# Patient Record
Sex: Male | Born: 1935 | Race: White | Hispanic: No | Marital: Married | Smoking: Former smoker
Health system: Southern US, Community
[De-identification: ages and names within clinical notes are randomized; demographics above are authoritative.]

## PROBLEM LIST (undated history)

## (undated) DIAGNOSIS — M25559 Pain in unspecified hip: Secondary | ICD-10-CM

## (undated) DIAGNOSIS — E119 Type 2 diabetes mellitus without complications: Principal | ICD-10-CM

## (undated) DIAGNOSIS — K5900 Constipation, unspecified: Secondary | ICD-10-CM

## (undated) DIAGNOSIS — S065X9A Traumatic subdural hemorrhage with loss of consciousness of unspecified duration, initial encounter: Secondary | ICD-10-CM

## (undated) DIAGNOSIS — R972 Elevated prostate specific antigen [PSA]: Secondary | ICD-10-CM

## (undated) DIAGNOSIS — M75101 Unspecified rotator cuff tear or rupture of right shoulder, not specified as traumatic: Secondary | ICD-10-CM

## (undated) DIAGNOSIS — S065XAA Traumatic subdural hemorrhage with loss of consciousness status unknown, initial encounter: Secondary | ICD-10-CM

## (undated) DIAGNOSIS — Z201 Contact with and (suspected) exposure to tuberculosis: Secondary | ICD-10-CM

## (undated) DIAGNOSIS — M25519 Pain in unspecified shoulder: Secondary | ICD-10-CM

## (undated) DIAGNOSIS — R5381 Other malaise: Secondary | ICD-10-CM

## (undated) DIAGNOSIS — K259 Gastric ulcer, unspecified as acute or chronic, without hemorrhage or perforation: Secondary | ICD-10-CM

## (undated) DIAGNOSIS — K4090 Unilateral inguinal hernia, without obstruction or gangrene, not specified as recurrent: Secondary | ICD-10-CM

## (undated) DIAGNOSIS — S2239XA Fracture of one rib, unspecified side, initial encounter for closed fracture: Secondary | ICD-10-CM

## (undated) DIAGNOSIS — E785 Hyperlipidemia, unspecified: Secondary | ICD-10-CM

## (undated) DIAGNOSIS — N400 Enlarged prostate without lower urinary tract symptoms: Secondary | ICD-10-CM

## (undated) DIAGNOSIS — Z8613 Personal history of malaria: Secondary | ICD-10-CM

## (undated) DIAGNOSIS — L719 Rosacea, unspecified: Secondary | ICD-10-CM

## (undated) DIAGNOSIS — N200 Calculus of kidney: Secondary | ICD-10-CM

## (undated) DIAGNOSIS — K279 Peptic ulcer, site unspecified, unspecified as acute or chronic, without hemorrhage or perforation: Secondary | ICD-10-CM

## (undated) DIAGNOSIS — G4700 Insomnia, unspecified: Secondary | ICD-10-CM

## (undated) DIAGNOSIS — N529 Male erectile dysfunction, unspecified: Secondary | ICD-10-CM

## (undated) DIAGNOSIS — R5383 Other fatigue: Secondary | ICD-10-CM

## (undated) DIAGNOSIS — S4991XA Unspecified injury of right shoulder and upper arm, initial encounter: Secondary | ICD-10-CM

## (undated) DIAGNOSIS — N320 Bladder-neck obstruction: Secondary | ICD-10-CM

## (undated) DIAGNOSIS — E039 Hypothyroidism, unspecified: Secondary | ICD-10-CM

## (undated) DIAGNOSIS — I10 Essential (primary) hypertension: Secondary | ICD-10-CM

## (undated) HISTORY — DX: Insomnia, unspecified: G47.00

## (undated) HISTORY — DX: Bladder-neck obstruction: N32.0

## (undated) HISTORY — DX: Peptic ulcer, site unspecified, unspecified as acute or chronic, without hemorrhage or perforation: K27.9

## (undated) HISTORY — DX: Hypothyroidism, unspecified: E03.9

## (undated) HISTORY — DX: Rosacea, unspecified: L71.9

## (undated) HISTORY — DX: Pain in unspecified hip: M25.559

## (undated) HISTORY — DX: Male erectile dysfunction, unspecified: N52.9

## (undated) HISTORY — DX: Unilateral inguinal hernia, without obstruction or gangrene, not specified as recurrent: K40.90

## (undated) HISTORY — PX: PROSTATE SURGERY: SHX751

## (undated) HISTORY — DX: Other malaise: R53.81

## (undated) HISTORY — DX: Personal history of malaria: Z86.13

## (undated) HISTORY — DX: Other fatigue: R53.83

## (undated) HISTORY — DX: Gastric ulcer, unspecified as acute or chronic, without hemorrhage or perforation: K25.9

## (undated) HISTORY — DX: Constipation, unspecified: K59.00

## (undated) HISTORY — DX: Elevated prostate specific antigen (PSA): R97.20

## (undated) HISTORY — DX: Hyperlipidemia, unspecified: E78.5

## (undated) HISTORY — DX: Contact with and (suspected) exposure to tuberculosis: Z20.1

## (undated) HISTORY — DX: Pain in unspecified shoulder: M25.519

## (undated) HISTORY — DX: Benign prostatic hyperplasia without lower urinary tract symptoms: N40.0

## (undated) HISTORY — DX: Type 2 diabetes mellitus without complications: E11.9

---
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
## 1961-03-15 HISTORY — PX: PILONIDAL CYST EXCISION: SHX744

## 1989-03-15 DIAGNOSIS — N400 Enlarged prostate without lower urinary tract symptoms: Secondary | ICD-10-CM

## 1989-03-15 HISTORY — DX: Benign prostatic hyperplasia without lower urinary tract symptoms: N40.0

## 1992-03-15 DIAGNOSIS — Z201 Contact with and (suspected) exposure to tuberculosis: Secondary | ICD-10-CM

## 1992-03-15 HISTORY — DX: Contact with and (suspected) exposure to tuberculosis: Z20.1

## 1993-03-15 DIAGNOSIS — K259 Gastric ulcer, unspecified as acute or chronic, without hemorrhage or perforation: Secondary | ICD-10-CM

## 1993-03-15 HISTORY — DX: Gastric ulcer, unspecified as acute or chronic, without hemorrhage or perforation: K25.9

## 1998-03-15 DIAGNOSIS — R972 Elevated prostate specific antigen [PSA]: Secondary | ICD-10-CM

## 1998-03-15 DIAGNOSIS — N529 Male erectile dysfunction, unspecified: Secondary | ICD-10-CM

## 1998-03-15 HISTORY — DX: Elevated prostate specific antigen (PSA): R97.20

## 1998-03-15 HISTORY — DX: Male erectile dysfunction, unspecified: N52.9

## 1998-07-08 DIAGNOSIS — I10 Essential (primary) hypertension: Secondary | ICD-10-CM

## 1998-07-08 HISTORY — DX: Essential (primary) hypertension: I10

## 2002-06-14 HISTORY — PX: HEMORROIDECTOMY: SUR656

## 2002-06-28 ENCOUNTER — Encounter (INDEPENDENT_AMBULATORY_CARE_PROVIDER_SITE_OTHER): Payer: Self-pay | Admitting: Specialist

## 2002-06-28 ENCOUNTER — Ambulatory Visit (HOSPITAL_COMMUNITY): Admission: RE | Admit: 2002-06-28 | Discharge: 2002-06-28 | Payer: Self-pay | Admitting: General Surgery

## 2003-06-25 DIAGNOSIS — K279 Peptic ulcer, site unspecified, unspecified as acute or chronic, without hemorrhage or perforation: Secondary | ICD-10-CM

## 2003-06-25 HISTORY — DX: Peptic ulcer, site unspecified, unspecified as acute or chronic, without hemorrhage or perforation: K27.9

## 2003-11-01 DIAGNOSIS — G4700 Insomnia, unspecified: Secondary | ICD-10-CM

## 2003-11-01 DIAGNOSIS — M25519 Pain in unspecified shoulder: Secondary | ICD-10-CM

## 2003-11-01 HISTORY — DX: Pain in unspecified shoulder: M25.519

## 2003-11-01 HISTORY — DX: Insomnia, unspecified: G47.00

## 2004-12-23 DIAGNOSIS — E785 Hyperlipidemia, unspecified: Secondary | ICD-10-CM | POA: Insufficient documentation

## 2004-12-23 DIAGNOSIS — R5381 Other malaise: Secondary | ICD-10-CM

## 2004-12-23 HISTORY — DX: Hyperlipidemia, unspecified: E78.5

## 2004-12-23 HISTORY — DX: Other malaise: R53.81

## 2006-07-15 DIAGNOSIS — E119 Type 2 diabetes mellitus without complications: Secondary | ICD-10-CM

## 2006-07-15 HISTORY — DX: Type 2 diabetes mellitus without complications: E11.9

## 2008-03-03 ENCOUNTER — Ambulatory Visit (HOSPITAL_COMMUNITY): Admission: RE | Admit: 2008-03-03 | Discharge: 2008-03-03 | Payer: Self-pay | Admitting: Family Medicine

## 2008-03-06 ENCOUNTER — Ambulatory Visit (HOSPITAL_BASED_OUTPATIENT_CLINIC_OR_DEPARTMENT_OTHER): Admission: RE | Admit: 2008-03-06 | Discharge: 2008-03-06 | Payer: Self-pay | Admitting: Urology

## 2008-03-22 ENCOUNTER — Inpatient Hospital Stay (HOSPITAL_COMMUNITY): Admission: AD | Admit: 2008-03-22 | Discharge: 2008-03-25 | Payer: Self-pay | Admitting: Urology

## 2008-03-28 ENCOUNTER — Inpatient Hospital Stay (HOSPITAL_COMMUNITY): Admission: RE | Admit: 2008-03-28 | Discharge: 2008-04-04 | Payer: Self-pay | Admitting: Urology

## 2008-03-28 ENCOUNTER — Encounter (INDEPENDENT_AMBULATORY_CARE_PROVIDER_SITE_OTHER): Payer: Self-pay | Admitting: Urology

## 2008-03-28 DIAGNOSIS — N320 Bladder-neck obstruction: Secondary | ICD-10-CM

## 2008-03-28 HISTORY — DX: Bladder-neck obstruction: N32.0

## 2008-10-30 ENCOUNTER — Encounter: Admission: RE | Admit: 2008-10-30 | Discharge: 2008-10-30 | Payer: Self-pay | Admitting: Surgery

## 2008-11-13 HISTORY — PX: HERNIA REPAIR: SHX51

## 2008-11-20 DIAGNOSIS — K4090 Unilateral inguinal hernia, without obstruction or gangrene, not specified as recurrent: Secondary | ICD-10-CM

## 2008-11-20 HISTORY — DX: Unilateral inguinal hernia, without obstruction or gangrene, not specified as recurrent: K40.90

## 2009-07-10 ENCOUNTER — Inpatient Hospital Stay (HOSPITAL_COMMUNITY): Admission: RE | Admit: 2009-07-10 | Discharge: 2009-07-14 | Payer: Self-pay | Admitting: Orthopedic Surgery

## 2009-07-10 HISTORY — PX: TOTAL HIP ARTHROPLASTY: SHX124

## 2009-08-20 DIAGNOSIS — N200 Calculus of kidney: Secondary | ICD-10-CM

## 2009-08-20 DIAGNOSIS — L719 Rosacea, unspecified: Secondary | ICD-10-CM

## 2009-08-20 HISTORY — DX: Rosacea, unspecified: L71.9

## 2009-08-20 HISTORY — DX: Calculus of kidney: N20.0

## 2009-12-17 DIAGNOSIS — E039 Hypothyroidism, unspecified: Secondary | ICD-10-CM

## 2009-12-17 DIAGNOSIS — M25559 Pain in unspecified hip: Secondary | ICD-10-CM

## 2009-12-17 HISTORY — DX: Pain in unspecified hip: M25.559

## 2009-12-17 HISTORY — DX: Hypothyroidism, unspecified: E03.9

## 2010-02-18 ENCOUNTER — Ambulatory Visit (HOSPITAL_COMMUNITY)
Admission: RE | Admit: 2010-02-18 | Discharge: 2010-02-18 | Payer: Self-pay | Source: Home / Self Care | Admitting: Orthopedic Surgery

## 2010-06-02 LAB — BASIC METABOLIC PANEL
BUN: 24 mg/dL — ABNORMAL HIGH (ref 6–23)
BUN: 18 mg/dL (ref 6–23)
BUN: 25 mg/dL — ABNORMAL HIGH (ref 6–23)
CO2: 25 mEq/L (ref 19–32)
Calcium: 7.8 mg/dL — ABNORMAL LOW (ref 8.4–10.5)
Calcium: 7.8 mg/dL — ABNORMAL LOW (ref 8.4–10.5)
Chloride: 105 mEq/L (ref 96–112)
Chloride: 108 mEq/L (ref 96–112)
Chloride: 106 mEq/L (ref 96–112)
Creatinine, Ser: 1.12 mg/dL (ref 0.4–1.5)
Creatinine, Ser: 1.13 mg/dL (ref 0.4–1.5)
Creatinine, Ser: 0.93 mg/dL (ref 0.4–1.5)
GFR calc Af Amer: >60 mL/min (ref >60)
GFR calc Af Amer: >60 mL/min (ref >60)
GFR calc non Af Amer: >60 mL/min (ref >60)
GFR calc non Af Amer: >60 mL/min (ref >60)
Glucose, Bld: 109 mg/dL — ABNORMAL HIGH (ref 70–99)
Glucose, Bld: 117 mg/dL — ABNORMAL HIGH (ref 70–99)
Glucose, Bld: 136 mg/dL — ABNORMAL HIGH (ref 70–99)
Potassium: 3.6 mEq/L (ref 3.5–5.1)
Potassium: 3.9 mEq/L (ref 3.5–5.1)
Sodium: 134 mEq/L — ABNORMAL LOW (ref 135–145)
Sodium: 136 mEq/L (ref 135–145)

## 2010-06-02 LAB — CBC
HCT: 26.1 % — ABNORMAL LOW (ref 39.0–52.0)
Hemoglobin: 8.3 g/dL — ABNORMAL LOW (ref 13.0–17.0)
Hemoglobin: 13.2 g/dL (ref 13.0–17.0)
Hemoglobin: 8.8 g/dL — ABNORMAL LOW (ref 13.0–17.0)
MCHC: 34.3 g/dL (ref 30.0–36.0)
MCHC: 33.6 g/dL (ref 30.0–36.0)
MCV: 91.2 fL (ref 78.0–100.0)
MCV: 91.3 fL (ref 78.0–100.0)
Platelets: 164 10*3/uL (ref 150–400)
Platelets: 130 10*3/uL — ABNORMAL LOW (ref 150–400)
Platelets: 141 10*3/uL — ABNORMAL LOW (ref 150–400)
RBC: 2.63 MIL/uL — ABNORMAL LOW (ref 4.22–5.81)
RBC: 4.27 MIL/uL (ref 4.22–5.81)
RDW: 13.7 % (ref 11.5–15.5)
RDW: 14 % (ref 11.5–15.5)
RDW: 13.9 % (ref 11.5–15.5)
RDW: 14 % (ref 11.5–15.5)
WBC: 7.1 10*3/uL (ref 4.0–10.5)

## 2010-06-02 LAB — GLUCOSE, CAPILLARY
Glucose-Capillary: 113 mg/dL — ABNORMAL HIGH (ref 70–99)
Glucose-Capillary: 115 mg/dL — ABNORMAL HIGH (ref 70–99)
Glucose-Capillary: 147 mg/dL — ABNORMAL HIGH (ref 70–99)
Glucose-Capillary: 108 mg/dL — ABNORMAL HIGH (ref 70–99)
Glucose-Capillary: 112 mg/dL — ABNORMAL HIGH (ref 70–99)
Glucose-Capillary: 114 mg/dL — ABNORMAL HIGH (ref 70–99)
Glucose-Capillary: 166 mg/dL — ABNORMAL HIGH (ref 70–99)
Glucose-Capillary: 186 mg/dL — ABNORMAL HIGH (ref 70–99)

## 2010-06-02 LAB — COMPREHENSIVE METABOLIC PANEL
AST: 16 U/L (ref 0–37)
Alkaline Phosphatase: 55 U/L (ref 39–117)
BUN: 18 mg/dL (ref 6–23)
Chloride: 105 mEq/L (ref 96–112)
Creatinine, Ser: 0.94 mg/dL (ref 0.4–1.5)
Glucose, Bld: 96 mg/dL (ref 70–99)
Sodium: 137 mEq/L (ref 135–145)
Total Protein: 6.9 g/dL (ref 6.0–8.3)

## 2010-06-02 LAB — DIFFERENTIAL
Basophils Relative: 1 % (ref 0–1)
Eosinophils Absolute: 0.3 10*3/uL (ref 0.0–0.7)
Lymphocytes Relative: 21 % (ref 12–46)
Lymphs Abs: 1.4 10*3/uL (ref 0.7–4.0)
Monocytes Relative: 8 % (ref 3–12)
Neutro Abs: 4.4 10*3/uL (ref 1.7–7.7)
Neutrophils Relative %: 66 % (ref 43–77)

## 2010-06-02 LAB — URINALYSIS, ROUTINE W REFLEX MICROSCOPIC
Nitrite: NEGATIVE
Urobilinogen, UA: 0.2 mg/dL (ref 0.0–1.0)
pH: 5 (ref 5.0–8.0)

## 2010-06-02 LAB — CROSSMATCH
ABO/RH(D): O POS
Antibody Screen: NEGATIVE

## 2010-06-02 LAB — HEMOGLOBIN A1C
Hgb A1c MFr Bld: 6 % — ABNORMAL HIGH (ref <5.7)
Mean Plasma Glucose: 126 mg/dL — ABNORMAL HIGH (ref <117)

## 2010-06-29 LAB — COMPREHENSIVE METABOLIC PANEL
AST: 20 U/L (ref 0–37)
Albumin: 3.5 g/dL (ref 3.5–5.2)
Alkaline Phosphatase: 55 U/L (ref 39–117)
CO2: 26 mEq/L (ref 19–32)
Chloride: 102 mEq/L (ref 96–112)
GFR calc Af Amer: >60 mL/min (ref >60)
GFR calc non Af Amer: 57 mL/min — ABNORMAL LOW (ref >60)
Potassium: 3.6 mEq/L (ref 3.5–5.1)
Total Bilirubin: 0.6 mg/dL (ref 0.3–1.2)

## 2010-06-29 LAB — CBC
HCT: 29.7 % — ABNORMAL LOW (ref 39.0–52.0)
HCT: 31.7 % — ABNORMAL LOW (ref 39.0–52.0)
HCT: 32.2 % — ABNORMAL LOW (ref 39.0–52.0)
HCT: 33.1 % — ABNORMAL LOW (ref 39.0–52.0)
HCT: 22.1 % — ABNORMAL LOW (ref 39.0–52.0)
Hemoglobin: 10 g/dL — ABNORMAL LOW (ref 13.0–17.0)
Hemoglobin: 10.8 g/dL — ABNORMAL LOW (ref 13.0–17.0)
Hemoglobin: 9.9 g/dL — ABNORMAL LOW (ref 13.0–17.0)
Hemoglobin: 9.1 g/dL — ABNORMAL LOW (ref 13.0–17.0)
MCHC: 33.6 g/dL (ref 30.0–36.0)
MCHC: 33.8 g/dL (ref 30.0–36.0)
MCHC: 33.9 g/dL (ref 30.0–36.0)
MCHC: 34 g/dL (ref 30.0–36.0)
MCV: 88 fL (ref 78.0–100.0)
MCV: 88.7 fL (ref 78.0–100.0)
MCV: 89.4 fL (ref 78.0–100.0)
MCV: 90 fL (ref 78.0–100.0)
MCV: 90.1 fL (ref 78.0–100.0)
Platelets: 187 10*3/uL (ref 150–400)
Platelets: 189 10*3/uL (ref 150–400)
Platelets: 192 10*3/uL (ref 150–400)
Platelets: 216 10*3/uL (ref 150–400)
Platelets: 162 10*3/uL (ref 150–400)
Platelets: 185 10*3/uL (ref 150–400)
RBC: 3.32 MIL/uL — ABNORMAL LOW (ref 4.22–5.81)
RBC: 3.63 MIL/uL — ABNORMAL LOW (ref 4.22–5.81)
RBC: 3.76 MIL/uL — ABNORMAL LOW (ref 4.22–5.81)
RBC: 2.47 MIL/uL — ABNORMAL LOW (ref 4.22–5.81)
RDW: 13.5 % (ref 11.5–15.5)
RDW: 13.7 % (ref 11.5–15.5)
RDW: 13.7 % (ref 11.5–15.5)
RDW: 13.8 % (ref 11.5–15.5)
RDW: 13.8 % (ref 11.5–15.5)
WBC: 6.3 10*3/uL (ref 4.0–10.5)
WBC: 8 10*3/uL (ref 4.0–10.5)
WBC: 6.3 10*3/uL (ref 4.0–10.5)
WBC: 8 10*3/uL (ref 4.0–10.5)
WBC: 8.3 10*3/uL (ref 4.0–10.5)

## 2010-06-29 LAB — CROSSMATCH
ABO/RH(D): O POS
Antibody Screen: NEGATIVE

## 2010-06-29 LAB — BASIC METABOLIC PANEL
BUN: 29 mg/dL — ABNORMAL HIGH (ref 6–23)
BUN: 19 mg/dL (ref 6–23)
CO2: 26 mEq/L (ref 19–32)
CO2: 26 mEq/L (ref 19–32)
Calcium: 8.1 mg/dL — ABNORMAL LOW (ref 8.4–10.5)
Calcium: 8.3 mg/dL — ABNORMAL LOW (ref 8.4–10.5)
Calcium: 8.2 mg/dL — ABNORMAL LOW (ref 8.4–10.5)
Chloride: 103 mEq/L (ref 96–112)
Creatinine, Ser: 1.72 mg/dL — ABNORMAL HIGH (ref 0.4–1.5)
Creatinine, Ser: 1.35 mg/dL (ref 0.4–1.5)
GFR calc Af Amer: >60 mL/min (ref >60)
GFR calc Af Amer: >60 mL/min (ref >60)
GFR calc Af Amer: >60 mL/min (ref >60)
GFR calc non Af Amer: 39 mL/min — ABNORMAL LOW (ref >60)
GFR calc non Af Amer: 51 mL/min — ABNORMAL LOW (ref >60)
GFR calc non Af Amer: 51 mL/min — ABNORMAL LOW (ref >60)
Glucose, Bld: 118 mg/dL — ABNORMAL HIGH (ref 70–99)
Glucose, Bld: 129 mg/dL — ABNORMAL HIGH (ref 70–99)
Glucose, Bld: 120 mg/dL — ABNORMAL HIGH (ref 70–99)
Potassium: 3.2 mEq/L — ABNORMAL LOW (ref 3.5–5.1)
Potassium: 3.9 mEq/L (ref 3.5–5.1)
Sodium: 134 mEq/L — ABNORMAL LOW (ref 135–145)
Sodium: 132 mEq/L — ABNORMAL LOW (ref 135–145)

## 2010-06-29 LAB — ABO/RH: ABO/RH(D): O POS

## 2010-06-29 LAB — GLUCOSE, CAPILLARY
Glucose-Capillary: 107 mg/dL — ABNORMAL HIGH (ref 70–99)
Glucose-Capillary: 88 mg/dL (ref 70–99)

## 2010-06-29 LAB — POCT I-STAT 4, (NA,K, GLUC, HGB,HCT)
Glucose, Bld: 121 mg/dL — ABNORMAL HIGH (ref 70–99)
Potassium: 4 mEq/L (ref 3.5–5.1)

## 2010-06-29 LAB — HEMOCCULT GUIAC POC 1CARD (OFFICE): Fecal Occult Bld: NEGATIVE

## 2010-07-28 NOTE — Op Note (Signed)
NAMELUCERO, [name]            ACCOUNT NO.:  [id number]   MEDICAL RECORD NO.:  [id number]          PATIENT TYPE:  INP   LOCATION:  1441                         FACILITY:  [facility]   PHYSICIAN:  Bobby Jensen, Bobby JensenDATE OF BIRTH:  04/08/1927   DATE OF PROCEDURE:  03/28/2008  DATE OF DISCHARGE:                               OPERATIVE REPORT   SURGEON:  Bobby Jensen, M.D.   ASSISTANT:  Dr. Allena Jensen.   PREOPERATIVE DIAGNOSES:  1. Benign prostatic hypertrophy.  2. Left distal ureteral stone.   POSTOPERATIVE DIAGNOSES:  1. Benign prostatic hypertrophy.  2. Left distal ureteral stone.   PROCEDURE:  1. Intravesical suprapubic prostatectomy.  2. Left-sided soft Flex double-J stent placement.   INDICATIONS:  Bobby Jensen is a 77 year old gentleman with a  symptomatic distal left ureteral stone.  He was seen in an attempt to  manage the stone.  However, it was radiolucent on KUB.  Furthermore,  due to his large prostate, we were unable to gain access into the left  ureteral orifice.  After further history, it became apparent that he had  significant BPH and symptoms and given his prostatic size, he elected to  proceed with suprapubic prostatectomy and possible ureterolithotomy.   FINDINGS:  1. Large prostatic adenoma with large median lobe overlapping the      ureteral orifices.  2. Unable to palpate ureteral stone thus uncomplicated placement of      left ureteral stent.   PROCEDURE IN DETAIL:  The patient brought back to the operating room.  After successful induction of general endotracheal anesthetic, he was  placed in the supine position.  A bump was placed under his sacrum.  At  this point a lower midline incision was made.  We dissected through and  identified the fascia.  We were able to enter the fascia in the midline.  The  retropubic space was developed and the bladder was identified.  A  vertical cystotomy incision was made in the bladder and our Balfour  retractor was used for lateral retraction.   At this point we identified both ureteral orifices and they were both  cannulated, the right one with a 5-French feeding tube and the left with  a glide wire.  We attempted to palpate the stone and were unsuccessful.  The location of the ureteral orifice was close to  the median lobe.  A  flap of the mucosa was created by incising the mucosa over the prostatic  adenoma.  Once this was done to the bladder neck, we used finger  dissection initially at the 12 o'clock position, sweeping out laterally  to free the prostatic adenoma.  Once freed, the adenoma was removed from  the field.  Figure-of-eight sutures were placed at the 5 and 7 o'clock  positions for hemostasis and on reinspection, there was no visible  bleeding.  We reidentified both ureteral orifices at this point.  They  were effluxing blue from the indigo carmine given previously.   At this point over the left Glidewire, we advanced a 6 x 26 French soft  Flex double-J stent as it was  felt that an attempt at open surgical  removal of the stone at this time was riskier than an endoscopic  approach at a later date .  Good distal curl was seen.  At this point  there was no active bleeding.  We closed the bladder in one layer with  chromic suture after placing a suprapubic tube and a 24-French Foley  with a 30 mL balloon.  The drain was placed through a separate stab  incision.  The fascia was closed with #1 PDS.  The skin was closed with  staples and the procedure was ended.  At the end of the case we  irrigated the bladder.  Light pink urine was seen emanating with no  clots.  He was placed on irrigation with normal saline running through a  suprapubic tube.  His Foley catheter and drain.  His JP tube was placed  on bulb suction.   Please note Dr. Vonita Jensen was responsible attending and present  throughout entire case.   ESTIMATED BLOOD LOSS:  350 mL.   URINARY OUTPUT:   Unrecorded.   DRAINS:  1. Foley catheter.  2. Suprapubic tube.  3. Blake drain.  Marland Kitchen   SPECIMENS:  Prostatic adenoma.     ______________________________  Bobby Boston, MD      Bobby Jensen, M.D.  Electronically Signed    BP/MEDQ  D:  03/28/2008  T:  03/28/2008  Job:  [id number]

## 2010-07-28 NOTE — Discharge Summary (Signed)
Bobby Jensen, Bobby Jensen            ACCOUNT NO.:  [id number]   MEDICAL RECORD NO.:  [id number]          PATIENT TYPE:  INP   LOCATION:  1441                         FACILITY:  [facility]   PHYSICIAN:  Maretta Bees. Vonita Moss, M.D.DATE OF BIRTH:  1927-07-12   DATE OF ADMISSION:  03/28/2008  DATE OF DISCHARGE:  04/04/2008                               DISCHARGE SUMMARY   ADMISSION DIAGNOSES:  1. Gross hematuria and clots.  2. Left ureteral stone.  3. BNC   DISCHARGE DIAGNOSES:  1. Status post suprapubic prostatectomy.  2. Status post left ureteral stent placement.   HISTORY OF PRESENT ILLNESS:  In brief, this is a 77 year old gentleman  with a long past history of bladder outlet obstruction, elevated PSA,  stone disease who presented with hematuria with passage of clots as well  as a left ureteral stone.  He was having acute symptoms and was  admitted.   HOSPITAL COURSE:  Again, the patient was admitted due to acute  exacerbation of the stone pain and hematuria with clots.  We ultimately  had a discussion with the patient regarding his management and given the  difficulty accessing his ureteral orifice and persistent difficulty with  hematuria as well as bladder outlet obstruction and lower urinary tract  symptoms from BPH, he elected to proceed with suprapubic prostatectomy  with possible open ureterolithotomy for stone manipulation.  This was  scheduled for the patient on March 28, 2008.  For further details on  the procedure, please see the operative report dictated that day.  Postoperatively the patient progressed slowly.  His diet was slowly  advanced from clears to a regular diet which he tolerated well.  His  continuous bladder irrigation from the suprapubic tube was discontinued  on postoperative day 1.  After that the patient had clear urine and on  postoperative day #3 his urethral catheter was removed.  On  postoperative day 7 after a clamping trial of the suprapubic tube, and  noting that he voided to completion, his suprapubic catheter was  removed.  On voiding the patient did note decreased force required to  urinate.   While in the hospital the patient increased his activity level and at  the time of discharge was ambulating nicely.  He started ambulating well  in the halls.  Furthermore, his pain was adequately controlled with p.o.  medications.  At the time of discharge his incision was clean, dry and  intact and had staples.  Please note on postoperative day #6 his  hemoglobin was checked and found to be a value of 7.6.  This was likely  due to acute blood loss anemia prior to and at the time of surgery with  persistent downtrending after surgery due to minimal hematuria he was  having.  Because of this, he was given 2 units of blood and his  hemoglobin responded nicely to above 11.  He tolerated the blood without  any complication.   On postoperative day #7 he was considered appropriate for discharge and  was discharged home in good condition without any indwelling drains.  He  will follow up with Dr.  Peterson in the near future for staple removal.  He will also require definitive management of his left ureteral stone  that has been stented.      Delman Kitten, MD      Maretta Bees. Vonita Moss, M.D.  Electronically Signed    DW/MEDQ  D:  04/04/2008  T:  04/04/2008  Job:  [id number]

## 2010-07-28 NOTE — Discharge Summary (Signed)
Bobby Jensen, Bobby Jensen            ACCOUNT NO.:  [id number]   MEDICAL RECORD NO.:  [id number]          PATIENT TYPE:  INP   LOCATION:  1403                         FACILITY:  [facility]   PHYSICIAN:  Maretta Bees. Vonita Moss, M.D.DATE OF BIRTH:  Mar 29, 1927   DATE OF ADMISSION:  03/22/2008  DATE OF DISCHARGE:  03/25/2008                               DISCHARGE SUMMARY   FINAL DIAGNOSES:  1. Left ureteral stone.  2. Left hydronephrosis.  3. Gross hematuria with prostatic hemorrhage.  4. Bladder neck contracture.  5. Elevated PSA.  6. Bilateral renal cyst.  7. Sleep apnea.  8. Anxiety.  9. Esophageal reflux.  10.Hypercholesterolemia.  11.Hypertension.  12.Peptic ulcer disease.  13.Diabetes.  14.Anemia.   HISTORY OF PRESENT ILLNESS:  This gentleman was found to have a left  ureteral stone after an episode of flank pain and an attend to remove  this ureteroscopically was unsuccessful due to a very large prostate and  obstructing median lobe.  His stone was visualized on CT but cannot be  seen on a KUB for lithotripsy.  He was admitted because of continued  bleeding clots.  It was felt that he needed admission to get this  bleeding under control.   PHYSICAL EXAMINATION:  He is a pleasant white male, but in acute  distress.  Physical findings were otherwise unremarkable except enlarged  benign-feeling prostate.   HOSPITAL COURSE:  After admission, he underwent placement of Foley  catheter and that controlled his bleeding significantly.  His hemoglobin  was monitored, and he is somewhat anemic.  Hemoglobin 10.8.  I had  several discussions with him about the management of his stone and  prostatic bleeding.  Since the stone was not visualized on x-ray, he  could not have lithotripsy, and since we cannot do ureteroscopy, I felt  that an open left ureteral lithotomy and at that time a suprapubic  prostatectomy would be appropriate.  I told him about the risk of  bleeding from the prostate,  suprapubic tube and urinary incontinence,  urinary retention and sexual dysfunction after surgery.  He will go home  and get scheduled electively later this week for this procedure.  If he  has problems in the meantime, he will let me know.  He was discharged in  improved condition.   DISCHARGE MEDICATIONS:  1. Flomax 0.4 mg daily.  2. I will have him stop his finasteride.  3. He can use oxycodone p.r.n.  4. Continue Micardis HCTZ 80/12.5 mg daily.  5. Glipizide 2-4 mg a day depending on his blood sugar.  6. Temazepam 15 mg at bedtime.  7. Iron tablets to take b.i.d. after meals.   PLAN:  Return later this week for his surgery.      Maretta Bees. Vonita Moss, M.D.  Electronically Signed     LJP/MEDQ  D:  03/25/2008  T:  03/25/2008  Job:  [id number]   cc:   Lenon Curt. Chilton Si, M.D.  Fax: [phone]

## 2010-07-28 NOTE — H&P (Signed)
Bobby Jensen            ACCOUNT NO.:  [id number]   MEDICAL RECORD NO.:  [id number]          PATIENT TYPE:  INP   LOCATION:  1403                         FACILITY:  [facility]   PHYSICIAN:  Maretta Bees. Vonita Moss, M.D.DATE OF BIRTH:  Oct 04, 1927   DATE OF ADMISSION:  03/22/2008  DATE OF DISCHARGE:                              HISTORY & PHYSICAL   HISTORY:  Bobby Jensen is 77 and has had a long history of bladder  outlet obstructive symptoms, elevated PSA levels and chronic therapy  with Avodart and Flomax.  Medical therapy has controlled his frequency  and weak stream.  In 2006, his prostate was measured at 164 gm.  His  PSAs ranged from 8-10 before he was put on Avodart.  Other urologic  problems include chronic microhematuria and bilateral renal cystic  disease.   His current problem began with the onset of 2 days of flank pain after  which I saw him on March 04, 2008.  He had a CT scan at [facility]  on March 03, 2008 that showed mild left hydronephrosis due to a 6 x 6  x 9 mm proximal left ureteral stone.  No other stones were visualized.  He was given oxycodone.  He never had a stone before.  His mother has  had a lot of stones.  I got a KUB and I could not see a stone for sure  and wondered whether it was faintly opaque or uric acid in nature.  On  March 06, 2008, he was taken to the operating room at [facility] because of the fact that the stone was potentially too  large to pass on its own and did not seem to be visualized to allow  lithotripsy.  At cystoscopy unfortunately, his prostate was so large  with such a large median lobe that I could not even see the left  ureteral orifice much less ureteroscope him.  He had a few small  bleeders of the bladder neck which I fulgurated.  He returned to see me  on March 18, 2008 and was having intermittent gross hematuria with  clots.  The hemoglobin was done and he was not anemic at that time.  I  had a  long discussion with him about his hematuria and the fact that it  was probably due to his prostate because it was large and vascular as  opposed to the left ureteral stone.  A repeat CT scan on March 18, 2008  showed the stone was perhaps more like 5 mm and had moved down over the  mid sacral iliac area.  There was some persistent mild left hydroureter  and hydronephrosis.   Unfortunately, he has continued to bleed and have clots and was admitted  today for further workup and evaluation.   PAST MEDICAL HISTORY:  1. Includes sleep apnea.  2. Chronic anxiety.  3. Esophageal reflux.  4. Hypercholesterolemia.  5. Hypertension.   PAST SURGICAL HISTORY:  1. Appendectomy.  2. Pilonidal cyst excision.   MEDICATIONS:  1. Flomax 0.4 mg daily.  2. Finasteride 5 mg daily.  3. Oxycodone  p.r.n. pain.  4. Micardis/HCT 80/12.5 mg daily.  5. Glipizide 2-4 mg depending upon his blood sugar.  6. Temazepam 15 mg at bedtime for rest.   ALLERGIES:  DENIED.   FAMILY HISTORY:  Positive for stones.   SOCIAL HISTORY:  He does drink some alcohol.  He does not smoke.  He has  not smoked for almost 25 years.   PHYSICAL EXAMINATION:  VITAL SIGNS:  As recorded.  GENERAL:  He is alert, oriented and anxious without significant pain or  discomfort.  No respiratory distress.  HEART:  Tones are regular.  ABDOMEN:  Soft, nontender.  Foley catheter is in place draining bloody  urine.  Prostate was not reexamined.   IMPRESSION:  1. Gross hematuria with clots probably due to prostatic bleeding.  2. Bladder neck contracture.  3. Elevated prostate-specific antigens.  4. Bilateral renal cysts.  5. Left ureteral stone.  6. Left hydronephrosis.  7. Sleep apnea.  8. Anxiety.  9. Esophageal reflux.  10.Hypercholesterolemia.  11.Hypertension.  12.History of peptic ulcer disease.  13.Diabetes mellitus.   PLAN:  KUB will be done to see if I can see the stone.  His hemoglobin  will be checked.  IV  hydration will be instituted.  Foley catheter will  be inserted using number 22-French Foley and then irrigation p.r.n.   He may end up needing surgical intervention.  He could be a candidate  for lithotripsy if we can see the stone.  However, if we cannot see it  and in view of this large prostate, it may be appropriate to consider  suprapubic prostatectomy and distal left ureterolithotomy at some time.      Maretta Bees. Vonita Moss, M.D.  Electronically Signed     LJP/MEDQ  D:  03/22/2008  T:  03/23/2008  Job:  [id number]

## 2010-07-28 NOTE — H&P (Signed)
Bobby Jensen, Bobby Jensen            ACCOUNT NO.:  [id number]   MEDICAL RECORD NO.:  [id number]          PATIENT TYPE:  INP   LOCATION:  0012                         FACILITY:  [facility]   PHYSICIAN:  Maretta Bees. Vonita Moss, M.D.DATE OF BIRTH:  26-Nov-1927   DATE OF ADMISSION:  03/28/2008  DATE OF DISCHARGE:                              HISTORY & PHYSICAL   HISTORY:  This gentleman has a well-documented history of gross  hematuria, prostatic enlargement, and a left ureteral stone on his  recent H and P last week.  Since that time, he went home and has had  some intermittent hematuria.  He has had no significant flank pain.  Followup CT scan this morning showed the distal left ureteral stone is  located near the UVJ.  Also, there was a nodule noted in the lower chest  and I sent a copy of the CT report to Dr. Frederik Pear who is his  internist.  I also informed the patient during his hospital stay about  this lesion that hopefully is benign.  He is admitted now for suprapubic  prostatectomy and removal of this distal left ureteral stone.   All his other information in his recent H and P is unchanged.   IMPRESSION:  1. Left ureteral stone.  2. Left hydronephrosis.  3. Gross hematuria.  4. Bladder neck contracture.  5. Elevated PSA.  6. Bilateral renal cysts.  7. Sleep apnea.  8. Anxiety.  9. Esophageal reflux.  10.Hypercholesterolemia.  11.Hypertension.  12.Peptic ulcer disease.  13.Diabetes.  14.Anemia.  15.Chest nodule.      Maretta Bees. Vonita Moss, M.D.  Electronically Signed     LJP/MEDQ  D:  03/28/2008  T:  03/28/2008  Job:  [id number]   cc:   Lenon Curt. Chilton Si, M.D.  Fax: [phone]

## 2010-07-28 NOTE — Op Note (Signed)
Bobby Jensen, Bobby Jensen            ACCOUNT NO.:  [id number]   MEDICAL RECORD NO.:  [id number]          PATIENT TYPE:  AMB   LOCATION:  [facility]                         FACILITY:  [facility]   PHYSICIAN:  Maretta Bees. Vonita Moss, M.D.DATE OF BIRTH:  [birth year]   DATE OF PROCEDURE:  03/06/2008  DATE OF DISCHARGE:                               OPERATIVE REPORT   PREOPERATIVE DIAGNOSIS:  Left ureteral stone.   POSTOPERATIVE DIAGNOSES:  Left ureteral stone.   PROCEDURE:  Cystoscopy and fulguration of bladder neck.   SURGEON:  Dr. Larey Dresser.   ANESTHESIA:  General.   INDICATIONS:  This 77 year old gentleman is diagnosed with a proximal  left ureteral stone measuring, on CT, 6 x 6  x 9 mm.  I looked at the  films and I thought it may be more the size of 6 x 6 mm.  It could not  be seen on a KUB.  He had severe pain for 3 days and his pain has  lessened in the last day or two.  He also now relates his pain to  another episode a few weeks ago.  I talked to him about just watchful  waiting at this time but he wanted to go ahead at this time to try and  resolve this problem with his significant stone.  I wonder whether it is  uric acid in composition since it was not obviously seen on KUB or  otherwise just faintly calcified.   PROCEDURE:  The patient was brought to the operating room and placed in  lithotomy position.  External genitalia were prepped and draped in usual  fashion.  He was cystoscoped.  The anterior urethra was normal.  The  prostate had large bilateral lobes and a very large median lobe.  There  were no intravesical lesions.  Despite using the 12 degree lens, 70  degrees lens and also utilizing a flexible cystoscope, I could not  visualize his trigone and ureteral orifices.  His median lobe was  somewhat friable, so to try and prevent postoperative problems with  bladder neck hemorrhage I used the Bugbee electrode to fulgurate a few  small bleeders at the bladder neck.  At the end  of the case I left water  in the bladder to help him void postop in the recovery room and there  was no bleeding at that time and the blood loss at surgery was minimal.  He was taken to the recovery room in good condition.      Maretta Bees. Vonita Moss, M.D.  Electronically Signed     LJP/MEDQ  D:  03/06/2008  T:  03/06/2008  Job:  [id number]

## 2010-07-31 NOTE — Op Note (Signed)
[name], [name]                        ACCOUNT NO.:  [id number]   MEDICAL RECORD NO.:  [id number]                   PATIENT TYPE:  AMB   LOCATION:  DAY                                  FACILITY:  [facility]   PHYSICIAN:  Timothy E. Earlene Plater, M.D.              DATE OF BIRTH:  [birth year]   DATE OF PROCEDURE:  06/28/2002  DATE OF DISCHARGE:                                 OPERATIVE REPORT   PREOPERATIVE DIAGNOSES:  1. Anal fissure.  2. Anal stenosis.  3. Anal tags.  4. Anal polyp.   POSTOPERATIVE DIAGNOSES:  1. Anal fissure.  2. Anal stenosis.  3. Anal tags.  4. Anal polyp.   PROCEDURES:  1. Examination under anesthesia.  2. Repair of anal fissure.  3. Excision of anal tags and polyp.   SURGEON:  Timothy E. Earlene Plater, M.D.   ANESTHESIA:  General.   CLINICAL NOTE:  The patient has visited my office for consultation.  The  above findings were made and surgical repair is recommended.  This is a long-  standing problem, and he agrees and understands to the proposed procedure.  He was prepared at home, brought into the hospital this morning, identified,  and a permit signed, evaluated by anesthesia.  His laboratory data were  normal.  His cardiogram showed bradycardia but otherwise normal.   DESCRIPTION OF PROCEDURE:  He was taken to the operating room, placed  supine, LMA anesthesia provided.  He was placed in lithotomy.  Perianal area  inspected, prepped and draped in the usual fashion.  The anus was stenotic.  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  There was a large anal polyp in the  posterior area, I suspect emanating from the fissure.  There was an external  tag right posterior and an external tag right anterior.  No internal  hemorrhoids were noted.  The anus was gently dilated to two fingers and then  the perianal area was injected around and about with 0.5% Marcaine with  epinephrine.  This was  massaged in well.  The polyp was excised, its base  closed with running 3-0 chromic.  The two tags were excised in a  circumferential fashion, and their small base was each closed separately  with a 3-0 chromic.  The fissure was cauterized and a left posterior  internal sphincterotomy accomplished percutaneously.  The external sphincter  remained intact.  There were no complications, no bleeding.  The area was  carefully inspected and  then packed with Gelfoam and externally a dry sterile dressing.  He  tolerated it well, was awakened and taken to the recovery room in good  condition.   Written and verbal instructions were given, including Percocet for pain, and  will be seen and followed in the office.  Timothy E. Earlene Plater, M.D.    TED/MEDQ  D:  06/28/2002  T:  06/28/2002  Job:  [id number]   cc:   Lenon Curt. Chilton Si, M.D.  [address]  [place]  [place] [address]  Fax: [phone]   Maretta Bees. Vonita Moss, M.D.  [address]  [place]  [place] [address]  Fax: [phone]

## 2010-08-05 DIAGNOSIS — K5900 Constipation, unspecified: Secondary | ICD-10-CM

## 2010-08-05 HISTORY — DX: Constipation, unspecified: K59.00

## 2010-12-17 LAB — POCT I-STAT 4, (NA,K, GLUC, HGB,HCT)
Glucose, Bld: 98 mg/dL (ref 70–99)
HCT: 39 % (ref 39.0–52.0)
Potassium: 5.2 mEq/L — ABNORMAL HIGH (ref 3.5–5.1)
Sodium: 137 mEq/L (ref 135–145)

## 2011-01-04 ENCOUNTER — Other Ambulatory Visit: Payer: Self-pay | Admitting: Orthopaedic Surgery

## 2011-01-04 DIAGNOSIS — M76899 Other specified enthesopathies of unspecified lower limb, excluding foot: Secondary | ICD-10-CM

## 2011-01-06 ENCOUNTER — Ambulatory Visit
Admission: RE | Admit: 2011-01-06 | Discharge: 2011-01-06 | Disposition: A | Discharge status: Home / Self Care | Payer: Medicare Other | Source: Ambulatory Visit | Attending: Orthopaedic Surgery | Admitting: Orthopaedic Surgery

## 2011-01-06 ENCOUNTER — Other Ambulatory Visit: Payer: Self-pay

## 2011-01-06 DIAGNOSIS — M76899 Other specified enthesopathies of unspecified lower limb, excluding foot: Secondary | ICD-10-CM

## 2011-01-06 MED ORDER — IOHEXOL 180 MG/ML  SOLN
15.0000 mL | Freq: Once | INTRAMUSCULAR | Status: AC | PRN
  Administered 2011-01-06: 15 mL via INTRA_ARTICULAR

## 2011-07-31 ENCOUNTER — Emergency Department (HOSPITAL_COMMUNITY)
Admission: EM | Admit: 2011-07-31 | Discharge: 2011-07-31 | Disposition: A | Discharge status: Home / Self Care | Payer: Medicare Other | Attending: Emergency Medicine | Admitting: Emergency Medicine

## 2011-07-31 ENCOUNTER — Encounter (HOSPITAL_COMMUNITY): Payer: Self-pay | Admitting: Emergency Medicine

## 2011-07-31 ENCOUNTER — Emergency Department (HOSPITAL_COMMUNITY)
Admission: EM | Admit: 2011-07-31 | Discharge: 2011-07-31 | Disposition: A | Discharge status: Nursing Home (Includes ICF) | Payer: Medicare Other | Source: Home / Self Care | Attending: Emergency Medicine | Admitting: Emergency Medicine

## 2011-07-31 DIAGNOSIS — R109 Unspecified abdominal pain: Secondary | ICD-10-CM

## 2011-07-31 DIAGNOSIS — R319 Hematuria, unspecified: Secondary | ICD-10-CM

## 2011-07-31 DIAGNOSIS — E119 Type 2 diabetes mellitus without complications: Secondary | ICD-10-CM | POA: Insufficient documentation

## 2011-07-31 DIAGNOSIS — I10 Essential (primary) hypertension: Secondary | ICD-10-CM | POA: Insufficient documentation

## 2011-07-31 DIAGNOSIS — Z79899 Other long term (current) drug therapy: Secondary | ICD-10-CM | POA: Insufficient documentation

## 2011-07-31 HISTORY — DX: Essential (primary) hypertension: I10

## 2011-07-31 HISTORY — DX: Calculus of kidney: N20.0

## 2011-07-31 LAB — POCT URINALYSIS DIP (DEVICE)
Glucose, UA: NEGATIVE mg/dL
Leukocytes, UA: NEGATIVE
Nitrite: NEGATIVE
Protein, ur: 100 mg/dL — AB
Urobilinogen, UA: 0.2 mg/dL (ref 0.0–1.0)

## 2011-07-31 LAB — POCT I-STAT, CHEM 8
HCT: 47 % (ref 39.0–52.0)
Hemoglobin: 16 g/dL (ref 13.0–17.0)
Sodium: 141 mEq/L (ref 135–145)
TCO2: 26 mmol/L (ref 0–100)

## 2011-07-31 MED ORDER — ONDANSETRON 4 MG PO TBDP
ORAL_TABLET | ORAL | Status: AC
  Filled 2011-07-31: qty 1

## 2011-07-31 MED ORDER — KETOROLAC TROMETHAMINE 30 MG/ML IJ SOLN
60.0000 mg | Freq: Once | INTRAMUSCULAR | Status: AC
  Administered 2011-07-31: 60 mg via INTRAMUSCULAR

## 2011-07-31 MED ORDER — HYDROCODONE-ACETAMINOPHEN 5-325 MG PO TABS: 2.0000 | ORAL_TABLET | Freq: Once | ORAL | Status: DC

## 2011-07-31 MED ORDER — HYDROCODONE-ACETAMINOPHEN 5-325 MG PO TABS
ORAL_TABLET | ORAL | Status: AC
  Filled 2011-07-31: qty 2

## 2011-07-31 MED ORDER — ONDANSETRON 4 MG PO TBDP
4.0000 mg | ORAL_TABLET | Freq: Once | ORAL | Status: AC
  Administered 2011-07-31: 4 mg via ORAL

## 2011-07-31 MED ORDER — KETOROLAC TROMETHAMINE 30 MG/ML IJ SOLN
INTRAMUSCULAR | Status: AC
  Filled 2011-07-31: qty 1

## 2011-07-31 NOTE — ED Provider Notes (Signed)
History     CSN: [id number]  Arrival date & time 07/31/11  1425   First MD Initiated Contact with Patient 07/31/11 1454      Chief Complaint  Patient presents with  . Abdominal Pain    (Consider location/radiation/quality/duration/timing/severity/associated sxs/prior treatment) HPI Comments: Patient history of hypertension, uric acid kidney stones,  reports the acute onset of severe, colicky dull right back and flank pain followed by gross hematuria with clots starting early this morning.  Reports multiple episodes of nausea and vomiting. States his urine has since cleared and the pain has improved, but had several further episodes of emesis. No aggravating or alleviating factors. He has not tried anything for his symptoms. No fevers. No urinary urgency, frequency, oderous urine. States he he is able to urinate freely. No back or abdominal swelling. No penile discharge, testicular pain. No coughing, wheezing, chest pain, shortness of breath.  ROS as noted in HPI. All other ROS negative.   Patient is a 78 y.o. male presenting with abdominal pain. The history is provided by the patient. No language interpreter was used.  Abdominal Pain The primary symptoms of the illness include abdominal pain. The current episode started 6 to 12 hours ago. The onset of the illness was sudden. The problem has been gradually improving.  The patient has not had a change in bowel habit. Risk factors for an acute abdominal problem include being elderly. Additional symptoms associated with the illness include hematuria and back pain. Symptoms associated with the illness do not include constipation, urgency or frequency.    Past Medical History  Diagnosis Date  . Hypertension   . Diabetes mellitus   . Kidney calculi     Past Surgical History  Procedure Date  . Prostate surgery   . Appendectomy   . Total hip arthroplasty   . Hernia repair     History reviewed. No pertinent family history.  History    Substance Use Topics  . Smoking status: Former Smoker    Quit date: 07/31/1978  . Smokeless tobacco: Not on file  . Alcohol Use: Yes      Review of Systems  Gastrointestinal: Positive for abdominal pain. Negative for constipation.  Genitourinary: Positive for hematuria. Negative for urgency and frequency.  Musculoskeletal: Positive for back pain.    Allergies  Review of patient's allergies indicates no known allergies.  Home Medications   Current Outpatient Rx  Name Route Sig Dispense Refill  . VITAMIN D PO Oral Take by mouth.    Marland Kitchen GLIPIZIDE ER 2.5 MG PO TB24 Oral Take 2.5 mg by mouth daily.    Marland Kitchen SYNTHROID PO Oral Take by mouth.    Marland Kitchen LOSARTAN POTASSIUM 50 MG PO TABS Oral Take 40 mg by mouth daily.    Marland Kitchen SIMVASTATIN 20 MG PO TABS Oral Take 20 mg by mouth every evening.      BP 170/83  Pulse 57  Temp(Src) 98.3 F (36.8 C) (Oral)  Resp 22  SpO2 94%  Physical Exam  Nursing note and vitals reviewed. Constitutional: He is oriented to person, place, and time. He appears well-developed and well-nourished.       Moderately uncomfortable, pacing around room  HENT:  Head: Normocephalic and atraumatic.  Eyes: Conjunctivae and EOM are normal.  Neck: Normal range of motion.  Cardiovascular: Normal rate.   Pulmonary/Chest: Effort normal. No respiratory distress.  Abdominal: Soft. Bowel sounds are normal. He exhibits no distension and no mass. There is no tenderness. There is no rebound,  no guarding and no CVA tenderness.       Several well-healed surgical scars. No apparent hernia.  Genitourinary: Testes normal and penis normal. Circumcised.  Musculoskeletal: Normal range of motion.  Neurological: He is alert and oriented to person, place, and time.  Skin: Skin is warm and dry.  Psychiatric: He has a normal mood and affect. His behavior is normal.    ED Course  Procedures (including critical care time)  Labs Reviewed  POCT URINALYSIS DIP (DEVICE) - Abnormal; Notable for  the following:    Bilirubin Urine SMALL (*)    Ketones, ur TRACE (*)    Hgb urine dipstick LARGE (*)    Protein, ur 100 (*)    All other components within normal limits  POCT I-STAT, CHEM 8 - Abnormal; Notable for the following:    BUN 35 (*)    Creatinine, Ser 1.50 (*)    Glucose, Bld 158 (*)    All other components within normal limits   No results found.   1. Flank pain   2. Hematuria     Results for orders placed during the hospital encounter of 07/31/11  POCT URINALYSIS DIP (DEVICE)      Component Value Range   Glucose, UA NEGATIVE  NEGATIVE (mg/dL)   Bilirubin Urine SMALL (*) NEGATIVE    Ketones, ur TRACE (*) NEGATIVE (mg/dL)   Specific Gravity, Urine >=1.030  1.005 - 1.030    Hgb urine dipstick LARGE (*) NEGATIVE    pH 5.0  5.0 - 8.0    Protein, ur 100 (*) NEGATIVE (mg/dL)   Urobilinogen, UA 0.2  0.0 - 1.0 (mg/dL)   Nitrite NEGATIVE  NEGATIVE    Leukocytes, UA NEGATIVE  NEGATIVE   POCT I-STAT, CHEM 8      Component Value Range   Sodium 141  135 - 145 (mEq/L)   Potassium 3.8  3.5 - 5.1 (mEq/L)   Chloride 105  96 - 112 (mEq/L)   BUN 35 (*) 6 - 23 (mg/dL)   Creatinine, Ser 1.61 (*) 0.50 - 1.35 (mg/dL)   Glucose, Bld 096 (*) 70 - 99 (mg/dL)   Calcium, Ion 0.45  4.09 - 1.32 (mmol/L)   TCO2 26  0 - 100 (mmol/L)   Hemoglobin 16.0  13.0 - 17.0 (g/dL)   HCT 81.1  91.4 - 78.2 (%)    MDM   On exam, No CVA, flank, suprapubic tenderness, GU exam is normal, however, patient is large blood in udip, and some mild renal insufficiency which is new for him. Concern for obstructing stone versus another serious etiology of his symptoms, especially given his age. Transferring for further imaging and evaluation. Gave Toradol, Zofran, Norco here.  patient agrees plan    Luiz Blare, MD 07/31/11 1744

## 2011-07-31 NOTE — ED Provider Notes (Signed)
History     CSN: [id number]  Arrival date & time 07/31/11  1810   First MD Initiated Contact with Patient 07/31/11 1834      Chief Complaint  Patient presents with  . Abdominal Pain     Patient is a 78 y.o. male presenting with abdominal pain. The history is provided by the patient.  Abdominal Pain The primary symptoms of the illness include abdominal pain, nausea and vomiting. The primary symptoms of the illness do not include fever or shortness of breath. The current episode started 6 to 12 hours ago. The onset of the illness was sudden. The problem has been gradually improving.  located in right flank Pt reports onset of right flank/abdominal pain with nausea/vomiting earlier today Reports he had hematuria, and then symptoms started to somewhat improve He reports h/o kidney stones and seem similar to prior kidney stones No cp/sob No focal weakness No fever   Past Medical History  Diagnosis Date  . Hypertension   . Diabetes mellitus   . Kidney calculi     Past Surgical History  Procedure Date  . Prostate surgery   . Appendectomy   . Total hip arthroplasty   . Hernia repair     History reviewed. No pertinent family history.  History  Substance Use Topics  . Smoking status: Former Smoker    Quit date: 07/31/1978  . Smokeless tobacco: Not on file  . Alcohol Use: Yes      Review of Systems  Constitutional: Negative for fever.  Respiratory: Negative for shortness of breath.   Gastrointestinal: Positive for nausea, vomiting and abdominal pain.    Allergies  Review of patient's allergies indicates no known allergies.  Home Medications   Current Outpatient Rx  Name Route Sig Dispense Refill  . VITAMIN D PO Oral Take 1,000 Units by mouth daily.     Marland Kitchen GLIPIZIDE ER 2.5 MG PO TB24 Oral Take 1.25 mg by mouth daily.     Marland Kitchen SYNTHROID PO Oral Take 1 tablet by mouth.     Marland Kitchen LOSARTAN POTASSIUM 50 MG PO TABS Oral Take 40 mg by mouth daily.    Marland Kitchen SIMVASTATIN 20 MG PO  TABS Oral Take 20 mg by mouth every other day.     Marland Kitchen TEMAZEPAM 15 MG PO CAPS Oral Take 15 mg by mouth at bedtime.      BP 138/86  Pulse 88  Temp(Src) 98.2 F (36.8 C) (Oral)  Resp 18  SpO2 100%  Physical Exam CONSTITUTIONAL: Well developed/well nourished HEAD AND FACE: Normocephalic/atraumatic EYES: EOMI/PERRL ENMT: Mucous membranes moist NECK: supple no meningeal signs SPINE:entire spine nontender CV: S1/S2 noted, no murmurs/rubs/gallops noted LUNGS: Lungs are clear to auscultation bilaterally, no apparent distress ABDOMEN: soft, nontender, no rebound or guarding GU:no cva tenderness NEURO: Pt is awake/alert, moves all extremitiesx4 EXTREMITIES: pulses normal, full ROM SKIN: warm, color normal PSYCH: no abnormalities of mood noted  ED Course  Procedures     1. Flank pain   2. Hematuria    By the time of my exam pt felt improved and requested d/c home I advised him of need for CT imaging but he does not want to stay We discussed strict return precautions, and I advised him an acute abd process could not ruled out without CT imaging He will return if any symptoms recur   MDM  Nursing notes reviewed and considered in documentation All labs/vitals reviewed and considered Previous records reviewed and considered  Joya Gaskins, MD 07/31/11 2009

## 2011-07-31 NOTE — ED Notes (Signed)
[facility] complaining of right sided abdominal and flank pain along with nausea/vomiting.  Patient reports passing kidney stone this morning; patient reports hematuria after passing stone.  Patient reports that pain started again, so he went to [facility].  Patient denies pain at this time; states that he was given an analgesic at [facility].  Patient referred here for possible CT scan.

## 2011-07-31 NOTE — ED Notes (Signed)
Patient c/o right abdominal/flank pain last night, nausea and vomiting.  Noted blood in urine, clots visible per patient, noticed this am.  Has been back to the bathroom with clearing of urine, but pain reocurs

## 2011-07-31 NOTE — ED Notes (Signed)
EDP at bedside  

## 2012-07-20 ENCOUNTER — Other Ambulatory Visit: Payer: Self-pay | Admitting: *Deleted

## 2012-07-20 DIAGNOSIS — I10 Essential (primary) hypertension: Secondary | ICD-10-CM

## 2012-07-20 DIAGNOSIS — E785 Hyperlipidemia, unspecified: Secondary | ICD-10-CM

## 2012-07-20 DIAGNOSIS — E039 Hypothyroidism, unspecified: Secondary | ICD-10-CM

## 2012-07-20 DIAGNOSIS — E1059 Type 1 diabetes mellitus with other circulatory complications: Secondary | ICD-10-CM

## 2012-07-26 ENCOUNTER — Other Ambulatory Visit: Payer: Self-pay | Admitting: Geriatric Medicine

## 2012-07-26 MED ORDER — TEMAZEPAM 15 MG PO CAPS: 15.0000 mg | ORAL_CAPSULE | Freq: Every day | ORAL | Status: DC

## 2012-09-04 ENCOUNTER — Other Ambulatory Visit: Payer: Self-pay | Admitting: *Deleted

## 2012-09-04 ENCOUNTER — Other Ambulatory Visit: Payer: Medicare Other

## 2012-09-04 DIAGNOSIS — I10 Essential (primary) hypertension: Secondary | ICD-10-CM

## 2012-09-04 DIAGNOSIS — E039 Hypothyroidism, unspecified: Secondary | ICD-10-CM

## 2012-09-04 DIAGNOSIS — E785 Hyperlipidemia, unspecified: Secondary | ICD-10-CM

## 2012-09-04 DIAGNOSIS — E1059 Type 1 diabetes mellitus with other circulatory complications: Secondary | ICD-10-CM

## 2012-09-05 ENCOUNTER — Encounter: Payer: Self-pay | Admitting: *Deleted

## 2012-09-05 LAB — BASIC METABOLIC PANEL
CO2: 22 mmol/L (ref 18–29)
Calcium: 8.9 mg/dL (ref 8.6–10.2)
Chloride: 102 mmol/L (ref 97–108)
GFR calc non Af Amer: 66 mL/min/{1.73_m2} (ref >59)
Potassium: 4 mmol/L (ref 3.5–5.2)
Sodium: 139 mmol/L (ref 134–144)

## 2012-09-05 LAB — HEMOGLOBIN A1C
Est. average glucose Bld gHb Est-mCnc: 140 mg/dL
Hgb A1c MFr Bld: 6.5 % — ABNORMAL HIGH (ref 4.8–5.6)

## 2012-09-06 ENCOUNTER — Ambulatory Visit (INDEPENDENT_AMBULATORY_CARE_PROVIDER_SITE_OTHER): Payer: Medicare Other | Admitting: Internal Medicine

## 2012-09-06 ENCOUNTER — Other Ambulatory Visit: Payer: Self-pay | Admitting: *Deleted

## 2012-09-06 ENCOUNTER — Encounter: Payer: Self-pay | Admitting: Internal Medicine

## 2012-09-06 VITALS — BP 126/70 | HR 52 | Temp 97.0°F | Resp 18 | Wt 167.8 lb

## 2012-09-06 DIAGNOSIS — E785 Hyperlipidemia, unspecified: Secondary | ICD-10-CM

## 2012-09-06 DIAGNOSIS — N400 Enlarged prostate without lower urinary tract symptoms: Secondary | ICD-10-CM

## 2012-09-06 DIAGNOSIS — E119 Type 2 diabetes mellitus without complications: Secondary | ICD-10-CM

## 2012-09-06 DIAGNOSIS — I10 Essential (primary) hypertension: Secondary | ICD-10-CM

## 2012-09-06 DIAGNOSIS — E039 Hypothyroidism, unspecified: Secondary | ICD-10-CM

## 2012-09-06 DIAGNOSIS — R972 Elevated prostate specific antigen [PSA]: Secondary | ICD-10-CM

## 2012-09-06 DIAGNOSIS — G4700 Insomnia, unspecified: Secondary | ICD-10-CM

## 2012-09-06 MED ORDER — TRIAMCINOLONE ACETONIDE 0.1 % EX CREA: TOPICAL_CREAM | Freq: Two times a day (BID) | CUTANEOUS | Status: DC

## 2012-09-06 MED ORDER — LEVOTHYROXINE SODIUM 50 MCG PO TABS: ORAL_TABLET | ORAL | Status: DC

## 2012-09-06 NOTE — Patient Instructions (Addendum)
Continue current medications. 

## 2012-11-09 ENCOUNTER — Encounter: Payer: Self-pay | Admitting: Internal Medicine

## 2012-11-09 NOTE — Progress Notes (Signed)
Subjective:    Patient ID: Bobby Jensen, male    DOB: 09-09-1927, 79 y.o.   MRN: [id number]  HPI Type II or unspecified type diabetes mellitus without mention of complication, not stated as uncontrolled : Well controlled  Hypertension: Controlled  Unspecified hypothyroidism: Compensated on supplements  Other and unspecified hyperlipidemia: Using simvastatin with adequate response  Insomnia, unspecified: Chronic and unchanged  Elevated prostate specific antigen (PSA): Not repeated recently  Hypertrophy of prostate without urinary obstruction and other lower urinary tract symptoms (LUTS): Likely cause of elevated PSA. He has a slow strain. Nocturia x2.    Current Outpatient Prescriptions on File Prior to Visit  Medication Sig Dispense Refill  . Cholecalciferol (VITAMIN D PO) Take 1,000 Units by mouth daily.       Marland Kitchen glipiZIDE (GLUCOTROL XL) 2.5 MG 24 hr tablet Take 1.25 mg by mouth daily. Take 1 tablet once daily to control blood sugars.      Marland Kitchen losartan-hydrochlorothiazide (HYZAAR) 100-25 MG per tablet Take 1 tablet by mouth daily. Take 1 tablet daily to control BP      . temazepam (RESTORIL) 15 MG capsule Take 1 capsule (15 mg total) by mouth at bedtime.  30 capsule  3   No current facility-administered medications on file prior to visit.   Immunization History  Administered Date(s) Administered  . Influenza Split 12/28/2011  . Pneumococcal Conjugate 02/15/1992  . Td 03/15/1990    Review of Systems  Constitutional: Negative.  Negative for fever, fatigue and unexpected weight change.  HENT: Negative.   Eyes: Positive for visual disturbance.  Respiratory: Negative.   Cardiovascular: Negative for chest pain, palpitations and leg swelling.  Gastrointestinal: Negative.   Endocrine:       Diabetic.  Genitourinary: Negative.   Musculoskeletal:       Unsteady gait. Denies arthritis. Chronic discomfort in the right hip area.  Skin: Negative.   Allergic/Immunologic: Negative.    Neurological:       Some difficulty with balance. Has a slightly unsteady gait.  Hematological: Negative.   Psychiatric/Behavioral:       Chronic mild anxiety. Wears about his wife.       Objective:   Physical Exam  Constitutional: He is oriented to person, place, and time. He appears well-developed and well-nourished. No distress.  Mildly overweight.  HENT:  Head: Normocephalic and atraumatic.  Right Ear: External ear normal.  Left Ear: External ear normal.  Nose: Nose normal.  Mouth/Throat: Oropharynx is clear and moist.  Eyes: Conjunctivae and EOM are normal. Pupils are equal, round, and reactive to light.  Wears corrective lenses. Reduced visual acuity bilaterally.  Neck: Neck supple. No JVD present. No tracheal deviation present. No thyromegaly present.  Cardiovascular: Normal rate, regular rhythm, normal heart sounds and intact distal pulses.  Exam reveals no gallop and no friction rub.   No murmur heard. Pulmonary/Chest: Breath sounds normal. No respiratory distress. He has no wheezes. He has no rales. He exhibits no tenderness.  Abdominal: Bowel sounds are normal. He exhibits no distension and no mass. There is no tenderness.  Musculoskeletal: Normal range of motion. He exhibits no edema.  Tenderness right hip. Mild gait instability.  Lymphadenopathy:    He has no cervical adenopathy.  Neurological: He is alert and oriented to person, place, and time. No cranial nerve deficit. Coordination normal.  Normal response to vibratory testing.  Skin: No rash noted. No erythema. No pallor.  Psychiatric: He has a normal mood and affect. His behavior is normal. Judgment  and thought content normal.      Appointment on 09/04/2012  Component Date Value Range Status  . Hemoglobin A1C 09/04/2012 6.5* 4.8 - 5.6 % Final   Comment:          Increased risk for diabetes: 5.7 - 6.4                                   Diabetes: >6.4                                   Glycemic control for  adults with diabetes: <7.0  . Estimated average glucose 09/04/2012 140   Final  . Glucose 09/04/2012 90  65 - 99 mg/dL Final  . BUN 16/12/9602 24  8 - 27 mg/dL Final  . Creatinine, Ser 09/04/2012 1.05  0.76 - 1.27 mg/dL Final  . GFR calc non Af Amer 09/04/2012 66  >59 mL/min/1.73 Final  . GFR calc Af Amer 09/04/2012 76  >59 mL/min/1.73 Final  . BUN/Creatinine Ratio 09/04/2012 23* 10 - 22 Final  . Sodium 09/04/2012 139  134 - 144 mmol/L Final  . Potassium 09/04/2012 4.0  3.5 - 5.2 mmol/L Final  . Chloride 09/04/2012 102  97 - 108 mmol/L Final  . CO2 09/04/2012 22  18 - 29 mmol/L Final                 **Please note reference interval change**  . Calcium 09/04/2012 8.9  8.6 - 10.2 mg/dL Final  . TSH 54/11/8117 3.910  0.450 - 4.500 uIU/mL Final       Assessment & Plan:  Type II or unspecified type diabetes mellitus without mention of complication, not stated as uncontrolled : Continue current medications and routine followup  - Plan: Basic Metabolic Panel, Hemoglobin A1c  Hypertension: Controlled  Unspecified hypothyroidism: Controlled on current supplement  Other and unspecified hyperlipidemia: Controlled  Insomnia, unspecified: Continue sleep medicines as needed.  Elevated prostate specific antigen (PSA): Urologist sees the patient and checked.  Hypertrophy of prostate without urinary obstruction and other lower urinary tract symptoms (LUTS): Stable, under urologic care.

## 2012-11-21 ENCOUNTER — Other Ambulatory Visit: Payer: Self-pay | Admitting: Internal Medicine

## 2013-01-12 ENCOUNTER — Other Ambulatory Visit: Payer: Self-pay | Admitting: Internal Medicine

## 2013-02-05 ENCOUNTER — Other Ambulatory Visit: Payer: Self-pay | Admitting: Internal Medicine

## 2013-02-26 ENCOUNTER — Other Ambulatory Visit: Payer: Medicare Other

## 2013-02-26 DIAGNOSIS — E119 Type 2 diabetes mellitus without complications: Secondary | ICD-10-CM

## 2013-02-27 LAB — BASIC METABOLIC PANEL
BUN/Creatinine Ratio: 23 — ABNORMAL HIGH (ref 10–22)
CO2: 24 mmol/L (ref 18–29)
Creatinine, Ser: 1.18 mg/dL (ref 0.76–1.27)
Sodium: 140 mmol/L (ref 134–144)

## 2013-02-28 ENCOUNTER — Ambulatory Visit: Payer: Medicare Other | Admitting: Internal Medicine

## 2013-03-05 ENCOUNTER — Ambulatory Visit: Payer: Medicare Other | Admitting: Internal Medicine

## 2013-03-06 ENCOUNTER — Encounter: Payer: Self-pay | Admitting: Internal Medicine

## 2013-03-06 ENCOUNTER — Ambulatory Visit (INDEPENDENT_AMBULATORY_CARE_PROVIDER_SITE_OTHER): Payer: Medicare Other | Admitting: Internal Medicine

## 2013-03-06 VITALS — BP 136/74 | HR 50 | Temp 97.8°F | Resp 10 | Wt 166.0 lb

## 2013-03-06 DIAGNOSIS — E785 Hyperlipidemia, unspecified: Secondary | ICD-10-CM

## 2013-03-06 DIAGNOSIS — E119 Type 2 diabetes mellitus without complications: Secondary | ICD-10-CM

## 2013-03-06 DIAGNOSIS — E039 Hypothyroidism, unspecified: Secondary | ICD-10-CM

## 2013-03-06 DIAGNOSIS — I10 Essential (primary) hypertension: Secondary | ICD-10-CM

## 2013-03-06 MED ORDER — ONETOUCH ULTRA 2 W/DEVICE KIT: PACK | Status: DC

## 2013-03-06 MED ORDER — GLUCOSE BLOOD VI STRP: ORAL_STRIP | Status: DC

## 2013-03-06 MED ORDER — ONETOUCH DELICA LANCETS 33G MISC: Status: DC

## 2013-03-06 NOTE — Patient Instructions (Signed)
Continue current medications. 

## 2013-03-06 NOTE — Progress Notes (Signed)
Patient ID: Bobby Jensen, male   DOB: [birth year], 79 y.o.   MRN: [id number]    Location:    [facility]  Place of Service:  office    Allergies  Allergen Reactions  . Fioricet [Butalbital-Apap-Caffeine]   . Other Other (See Comments)    Profen II DM= urinary outlet obstruction  . Tussin [Guaifenesin]     Chief Complaint  Patient presents with  . Medical Managment of Chronic Issues    6 month follow-up, discuss labs (copy given)     HPI:  Hypertension: controlled  Type II or unspecified type diabetes mellitus without mention of complication, not stated as uncontrolled: controlled  Unspecified hypothyroidism: needs lab  Other and unspecified hyperlipidemia: recheck lab next time  Laceration of the left middle finger on a can of tuna last night.   Medications: Patient's Medications  New Prescriptions   No medications on file  Previous Medications   BLOOD GLUCOSE MONITORING SUPPL (ONE TOUCH ULTRA 2) W/DEVICE KIT    by Does not apply route. Check blood sugar once daily as directed DX 250.00   CHOLECALCIFEROL (VITAMIN D PO)    Take 1,000 Units by mouth daily.    FLUVIRIN PRESERVATIVE FREE SUSP INJECTION       GLIPIZIDE (GLUCOTROL XL) 2.5 MG 24 HR TABLET    Take 1.25 mg by mouth daily. Take 1 tablet once daily to control blood sugars.   GLUCOSE BLOOD (FREESTYLE LITE TEST VI)       GLUCOSE BLOOD (ONE TOUCH ULTRA TEST) TEST STRIP    1 each by Other route. Check blood sugar once daily as directed DX 250.00   LANCETS (FREESTYLE) LANCETS       LEVOTHYROXINE (SYNTHROID, LEVOTHROID) 50 MCG TABLET    Take one tablet once a day for thyroid supplement   LOSARTAN-HYDROCHLOROTHIAZIDE (HYZAAR) 100-25 MG PER TABLET    TAKE ONE TABLET BY MOUTH EVERY DAY FOR BLOOD PRESSURE   ONETOUCH DELICA LANCETS 33G MISC    by Does not apply route. Check blood sugar once daily as directed DX 250.00   SIMVASTATIN (ZOCOR) 40 MG TABLET    Take 40 mg by mouth. Take one-half tablet once a day for cholesterol   TEMAZEPAM (RESTORIL) 15 MG CAPSULE    TAKE 1 CAPSULE BY MOUTH AT BEDTIME AS NEEDED   TRIAMCINOLONE CREAM (KENALOG) 0.1 %    Apply topically 2 (two) times daily. Apply to rash as needed.  Modified Medications   No medications on file  Discontinued Medications   No medications on file     Review of Systems  Constitutional: Negative.  Negative for fever, fatigue and unexpected weight change.  HENT: Negative.   Eyes: Positive for visual disturbance.  Respiratory: Negative.   Cardiovascular: Negative for chest pain, palpitations and leg swelling.  Gastrointestinal: Negative.   Endocrine:       Diabetic.  Genitourinary: Negative.   Musculoskeletal:       Unsteady gait. Denies arthritis. Chronic discomfort in the right hip area.  Skin:       3 stitches in left 3rd finger  Allergic/Immunologic: Negative.   Neurological:       Some difficulty with balance. Has a slightly unsteady gait.  Hematological: Negative.   Psychiatric/Behavioral:       Chronic mild anxiety. Wears about his wife.    Filed Vitals:   03/06/13 1308  BP: 136/74  Pulse: 50  Temp: 97.8 F (36.6 C)  TempSrc: Oral  Resp: 10  Weight: 166 lb (75.297  kg)  SpO2: 96%   Physical Exam  Constitutional: He is oriented to person, place, and time. He appears well-developed and well-nourished. No distress.  Mildly overweight.  HENT:  Head: Normocephalic and atraumatic.  Right Ear: External ear normal.  Left Ear: External ear normal.  Nose: Nose normal.  Mouth/Throat: Oropharynx is clear and moist.  Eyes: Conjunctivae and EOM are normal. Pupils are equal, round, and reactive to light.  Wears corrective lenses. Reduced visual acuity bilaterally.  Neck: Neck supple. No JVD present. No tracheal deviation present. No thyromegaly present.  Cardiovascular: Normal rate, regular rhythm, normal heart sounds and intact distal pulses.  Exam reveals no gallop and no friction rub.   No murmur heard. Pulmonary/Chest: Breath sounds  normal. No respiratory distress. He has no wheezes. He has no rales. He exhibits no tenderness.  Abdominal: Bowel sounds are normal. He exhibits no distension and no mass. There is no tenderness.  Musculoskeletal: Normal range of motion. He exhibits no edema.  Tenderness right hip. Mild gait instability.  Lymphadenopathy:    He has no cervical adenopathy.  Neurological: He is alert and oriented to person, place, and time. No cranial nerve deficit. Coordination normal.  Normal response to vibratory testing.  Skin: No rash noted. No erythema. No pallor.  Laceration of left 3rd finger.  Psychiatric: He has a normal mood and affect. His behavior is normal. Judgment and thought content normal.     Labs reviewed: Appointment on 02/26/2013  Component Date Value Range Status  . Glucose 02/26/2013 79  65 - 99 mg/dL Final  . BUN 91/47/8295 27  8 - 27 mg/dL Final  . Creatinine, Ser 02/26/2013 1.18  0.76 - 1.27 mg/dL Final  . GFR calc non Af Amer 02/26/2013 57* >59 mL/min/1.73 Final  . GFR calc Af Amer 02/26/2013 66  >59 mL/min/1.73 Final  . BUN/Creatinine Ratio 02/26/2013 23* 10 - 22 Final  . Sodium 02/26/2013 140  134 - 144 mmol/L Final  . Potassium 02/26/2013 4.0  3.5 - 5.2 mmol/L Final  . Chloride 02/26/2013 100  97 - 108 mmol/L Final  . CO2 02/26/2013 24  18 - 29 mmol/L Final  . Calcium 02/26/2013 9.2  8.6 - 10.2 mg/dL Final  . Hemoglobin A2Z 02/26/2013 6.5* 4.8 - 5.6 % Final   Comment:          Increased risk for diabetes: 5.7 - 6.4                                   Diabetes: >6.4                                   Glycemic control for adults with diabetes: <7.0  . Estimated average glucose 02/26/2013 140   Final      Assessment/Plan 1. Hypertension controlled  2. Type II or unspecified type diabetes mellitus without mention of complication, not stated as uncontrolled controlled - glucose blood (ONE TOUCH ULTRA TEST) test strip; Check blood sugar once daily as directed DX 250.00   Dispense: 100 each; Refill: 3 - ONETOUCH DELICA LANCETS 33G MISC; Check blood sugar once daily as directed DX 250.00  Dispense: 100 each; Refill: 3 - Blood Glucose Monitoring Suppl (ONE TOUCH ULTRA 2) W/DEVICE KIT; Check blood sugar once daily as directed DX 250.00  Dispense: 1 each; Refill: 0  3. Unspecified  hypothyroidism Lab next time  4. Other and unspecified hyperlipidemia Lab next time

## 2013-04-21 ENCOUNTER — Other Ambulatory Visit: Payer: Self-pay | Admitting: Internal Medicine

## 2013-04-30 ENCOUNTER — Other Ambulatory Visit: Payer: Self-pay | Admitting: Internal Medicine

## 2013-04-30 ENCOUNTER — Other Ambulatory Visit: Payer: Self-pay | Admitting: *Deleted

## 2013-04-30 MED ORDER — LOSARTAN POTASSIUM-HCTZ 100-25 MG PO TABS: ORAL_TABLET | ORAL | Status: DC

## 2013-06-11 ENCOUNTER — Other Ambulatory Visit: Payer: Self-pay | Admitting: Internal Medicine

## 2013-07-09 ENCOUNTER — Other Ambulatory Visit: Payer: Medicare Other

## 2013-07-09 DIAGNOSIS — E785 Hyperlipidemia, unspecified: Secondary | ICD-10-CM

## 2013-07-09 DIAGNOSIS — E039 Hypothyroidism, unspecified: Secondary | ICD-10-CM

## 2013-07-09 DIAGNOSIS — E119 Type 2 diabetes mellitus without complications: Secondary | ICD-10-CM

## 2013-07-10 LAB — LIPID PANEL
Chol/HDL Ratio: 3.9 ratio units (ref 0.0–5.0)
Cholesterol, Total: 169 mg/dL (ref 100–199)
HDL: 43 mg/dL (ref >39)
LDL CALC: 102 mg/dL — AB (ref 0–99)
Triglycerides: 122 mg/dL (ref 0–149)
VLDL CHOLESTEROL CAL: 24 mg/dL (ref 5–40)

## 2013-07-10 LAB — COMPREHENSIVE METABOLIC PANEL
A/G RATIO: 2 (ref 1.1–2.5)
ALK PHOS: 68 IU/L (ref 39–117)
ALT: 13 IU/L (ref 0–44)
AST: 18 IU/L (ref 0–40)
Albumin: 4.4 g/dL (ref 3.5–4.7)
BUN / CREAT RATIO: 26 — AB (ref 10–22)
BUN: 33 mg/dL — ABNORMAL HIGH (ref 8–27)
CO2: 23 mmol/L (ref 18–29)
CREATININE: 1.29 mg/dL — AB (ref 0.76–1.27)
Calcium: 9.5 mg/dL (ref 8.6–10.2)
Chloride: 102 mmol/L (ref 97–108)
GFR calc Af Amer: 59 mL/min/{1.73_m2} — ABNORMAL LOW (ref >59)
GFR, EST NON AFRICAN AMERICAN: 51 mL/min/{1.73_m2} — AB (ref >59)
Globulin, Total: 2.2 g/dL (ref 1.5–4.5)
Glucose: 91 mg/dL (ref 65–99)
POTASSIUM: 4.2 mmol/L (ref 3.5–5.2)
SODIUM: 141 mmol/L (ref 134–144)
Total Bilirubin: 0.5 mg/dL (ref 0.0–1.2)
Total Protein: 6.6 g/dL (ref 6.0–8.5)

## 2013-07-10 LAB — TSH: TSH: 4.57 u[IU]/mL — ABNORMAL HIGH (ref 0.450–4.500)

## 2013-07-10 LAB — HEMOGLOBIN A1C
Est. average glucose Bld gHb Est-mCnc: 137 mg/dL
HEMOGLOBIN A1C: 6.4 % — AB (ref 4.8–5.6)

## 2013-07-11 ENCOUNTER — Encounter: Payer: Self-pay | Admitting: Internal Medicine

## 2013-07-11 ENCOUNTER — Ambulatory Visit (INDEPENDENT_AMBULATORY_CARE_PROVIDER_SITE_OTHER): Payer: Medicare Other | Admitting: Internal Medicine

## 2013-07-11 VITALS — BP 124/72 | HR 56 | Temp 97.6°F | Ht 64.0 in | Wt 167.0 lb

## 2013-07-11 DIAGNOSIS — G4700 Insomnia, unspecified: Secondary | ICD-10-CM

## 2013-07-11 DIAGNOSIS — I498 Other specified cardiac arrhythmias: Secondary | ICD-10-CM

## 2013-07-11 DIAGNOSIS — E119 Type 2 diabetes mellitus without complications: Secondary | ICD-10-CM

## 2013-07-11 DIAGNOSIS — F411 Generalized anxiety disorder: Secondary | ICD-10-CM | POA: Insufficient documentation

## 2013-07-11 DIAGNOSIS — Z0000 Encounter for general adult medical examination without abnormal findings: Secondary | ICD-10-CM

## 2013-07-11 DIAGNOSIS — I10 Essential (primary) hypertension: Secondary | ICD-10-CM

## 2013-07-11 DIAGNOSIS — R001 Bradycardia, unspecified: Secondary | ICD-10-CM | POA: Insufficient documentation

## 2013-07-11 DIAGNOSIS — E039 Hypothyroidism, unspecified: Secondary | ICD-10-CM

## 2013-07-11 DIAGNOSIS — R972 Elevated prostate specific antigen [PSA]: Secondary | ICD-10-CM

## 2013-07-11 DIAGNOSIS — E785 Hyperlipidemia, unspecified: Secondary | ICD-10-CM

## 2013-07-11 MED ORDER — GLUCOSE BLOOD VI STRP: ORAL_STRIP | Status: DC

## 2013-07-11 MED ORDER — TEMAZEPAM 15 MG PO CAPS: ORAL_CAPSULE | ORAL | Status: DC

## 2013-07-11 MED ORDER — GLIPIZIDE 2.5 MG HALF TABLET: ORAL_TABLET | ORAL | Status: DC

## 2013-07-11 NOTE — Progress Notes (Signed)
Patient ID: Bobby Jensen, male   DOB: 01-Oct-1927, 80 y.o.   MRN: [id number]    Location:    [facility]  Place of Service:  OFFICE  PCP: Estill Dooms, MD  Code Status: LIVING WILL  Extended Emergency Contact Information Primary Emergency Contact: Coriz,Dorothy Address: [place], [place] Phone: [phone] Relation: Spouse  Allergies  Allergen Reactions  . Fioricet [Butalbital-Apap-Caffeine]   . Other Other (See Comments)    Profen II DM= urinary outlet obstruction  . Tussin [Guaifenesin]     Chief Complaint  Patient presents with  . Annual Exam    physical with labs printed  & [employer] form,   . Immunizations    declines shingles vaccine  . other    scored 0 on memmory test, wants to be able to test sugar twice daily and go back to using Freestyle    HPI:  Hypertension  Type II or unspecified type diabetes mellitus without mention of complication, not stated as uncontrolled: occ. Elevations of glucose, but most indicate satisfactory control  Unspecified hypothyroidism: controlled  Hyperlipidemia -controlled  Insomnia, unspecified - benefits from temazepam (RESTORIL) 15 MG capsule  Bradycardia - home rates range 50- 65. Denies chest pain, dyspnea, or palpitations.  Elevated prostate specific antigen (PSA) - needs follow up  Anxiety state, unspecified: wife had surgery with right partial colectomy due to colon cancer.      Past Medical History  Diagnosis Date  . Hypertension 07/08/1998  . Type II or unspecified type diabetes mellitus without mention of complication, not stated as uncontrolled 07/15/2006  . Kidney calculi 08/20/2009  . Unspecified constipation 08/05/2010  . Unspecified hypothyroidism 12/17/2009  . Pain in joint, pelvic region and thigh 12/17/2009  . Rosacea 08/20/2009  . Inguinal hernia without mention of obstruction or gangrene, unilateral or unspecified, (not specified as  recurrent) 11/20/2008  . Bladder neck obstruction 03/28/2008  . Other and unspecified hyperlipidemia 12/23/2004  . Other malaise and fatigue 12/23/2004  . Pain in joint, shoulder region 11/01/2003  . Insomnia, unspecified 11/01/2003  . Peptic ulcer, unspecified site, unspecified as acute or chronic, without mention of hemorrhage, perforation, or obstruction 06/25/2003  . 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 Elevated prostate specific antigen (PSA) 03/15/1998  . Gastric ulcer, unspecified as acute or chronic, without mention of hemorrhage, perforation, or obstruction 03/15/1993  . Hypertrophy of prostate without urinary obstruction and other lower urinary tract symptoms (LUTS) 03/15/1989  . Contact with or exposure to tuberculosis 03/15/1992  . Personal history of malaria 03/16/1939    Past Surgical History  Procedure Laterality Date  . Prostate surgery    . Total hip arthroplasty Right 07/10/2009    Dr. Telford Nab  . Appendectomy  1956  . Pilonidal cyst excision  1963  . Hemorroidectomy  06/2002    Dr. Lennie Hummer  . Hernia repair Bilateral 11/2008    inguinal    CONSULTANTS Dr. Drema Dallas- Optometry  Dr. Hessie Diener- Urologist  Dr. Telford Nab Orthopedic  Dr. Wynelle Link: Orthopedic Dr. Brantley Stage: Gen Surg  PAST PROCEDURES 1965- OCG/UGI Normal/Normal  1970- BaE/IVP Normal/Normal  08/1968- Bone Marrow Normal  05/1990- UGI Hyper-rigidity without ulcer  1992 -ETT Normal  1995 -EGD Gastric ulcer  04/26/1998- Chest X-RAy  04/26/2000- Chest X-Ray  09/02/1999- Chest X-Ray  10/04/2001- EKG  04/24/2001- chest X-Ray  05/10/2002- X-Ray Left Elbow  2003- Colonoscopy  06/01/2006- Chest X-Ray  08/17/2006- CT of Abdomen  and Pelvis: enlarged prostate, hemangioma in liver 03/28/2008 CT Scan of the Abdomen and pelvis  Dec 2011 MRI of the lumbar spine by Dr. Wynelle Link: Spinal stenosis  Social History: History   Social History  . Marital Status: Married    Spouse Name: N/A    Number of  Children: N/A  . Years of Education: N/A   Social History Main Topics  . Smoking status: Former Smoker    Quit date: 07/31/1978  . Smokeless tobacco: None  . Alcohol Use: Yes  . Drug Use: No  . Sexual Activity: Yes    Partners: Female   Other Topics Concern  . None   Social History Narrative  . None    Family History Family Status  Relation Status Death Age  . Father Deceased 73    MI  . Mother Deceased 38    natural causes  . Sister Alive     Age 13  . Daughter Alive   . Son Alive   . Daughter Alive    Family History  Problem Relation Age of Onset  . Heart disease Father      Medications: Patient's Medications  New Prescriptions   No medications on file  Previous Medications   BLOOD GLUCOSE MONITORING SUPPL (ONE TOUCH ULTRA 2) W/DEVICE KIT    Check blood sugar once daily as directed DX 250.00   CHOLECALCIFEROL (VITAMIN D PO)    Take 1,000 Units by mouth daily.    GLIPIZIDE (GLUCOTROL) 2.5 MG TABS TABLET    Take 1.25 mg by mouth daily to control Blood sugar.   GLUCOSE BLOOD (FREESTYLE LITE TEST VI)       GLUCOSE BLOOD (ONE TOUCH ULTRA TEST) TEST STRIP    Check blood sugar once daily as directed DX 250.00   LANCETS (FREESTYLE) LANCETS       LEVOTHYROXINE (SYNTHROID, LEVOTHROID) 50 MCG TABLET    Take one tablet once a day for thyroid supplement   LOSARTAN-HYDROCHLOROTHIAZIDE (HYZAAR) 100-25 MG PER TABLET    Take one tablet by mouth once daily to control blood pressure   ONETOUCH DELICA LANCETS 31R MISC    Check blood sugar once daily as directed DX 250.00   TRIAMCINOLONE CREAM (KENALOG) 0.1 %    Apply topically 2 (two) times daily. Apply to rash as needed.  Modified Medications   Modified Medication Previous Medication   SIMVASTATIN (ZOCOR) 40 MG TABLET simvastatin (ZOCOR) 40 MG tablet      TAKE 1/2 TABLET BY MOUTH AT BEDTIME TO LOWER CHOLESTEROL    TAKE ONE TABLET BY MOUTH AT BEDTIME TO LOWER CHOLESTEROL   TEMAZEPAM (RESTORIL) 15 MG CAPSULE temazepam  (RESTORIL) 15 MG capsule      TAKE 1 CAPSULE BY MOUTH AT BEDTIME AS NEEDED FOR SLEEP    TAKE 1 CAPSULE BY MOUTH AT BEDTIME AS NEEDED  Discontinued Medications   FLUVIRIN PRESERVATIVE FREE SUSP INJECTION       GLIPIZIDE (GLUCOTROL XL) 2.5 MG 24 HR TABLET    Take 1.25 mg by mouth daily. Take 1 tablet once daily to control blood sugars.    Immunization History  Administered Date(s) Administered  . Influenza Split 12/28/2011  . Influenza-Unspecified 01/14/2013  . Pneumococcal Conjugate-13 02/15/1992  . Td 03/15/1990  . Tdap 03/05/2013     Review of Systems  Constitutional: Negative.  Negative for fever, fatigue and unexpected weight change.  HENT: Negative.   Eyes: Positive for visual disturbance (corrective lenses).  Respiratory: Negative.   Cardiovascular: Negative for chest  pain, palpitations and leg swelling.  Gastrointestinal:       Mild left flank discomfort  Endocrine:       Diabetic.  Genitourinary: Negative.   Musculoskeletal:       Unsteady gait. Denies arthritis. Chronic discomfort in the right hip area.  Skin: Negative.   Allergic/Immunologic: Negative.   Neurological:       Some difficulty with balance. Has a slightly unsteady gait.  Hematological: Negative.   Psychiatric/Behavioral:       Chronic mild anxiety. Worries about his wife.      Filed Vitals:   07/11/13 1303  BP: 124/72  Pulse: 56  Temp: 97.6 F (36.4 C)  TempSrc: Oral  Height: _0  (1.626 m)  Weight: 167 lb (75.751 kg)  SpO2: 97%   Physical Exam  Constitutional: He is oriented to person, place, and time. He appears well-developed and well-nourished. No distress.  HENT:  Right Ear: External ear normal.  Left Ear: External ear normal.  Nose: Nose normal.  Mouth/Throat: Oropharynx is clear and moist. No oropharyngeal exudate.  Eyes: Conjunctivae and EOM are normal. Pupils are equal, round, and reactive to light.  Neck: No JVD present. No tracheal deviation present. No thyromegaly present.   Cardiovascular: Normal rate, normal heart sounds and intact distal pulses.  Exam reveals no gallop and no friction rub.   No murmur heard. Bradycardia  Respiratory: No respiratory distress. He has no wheezes. He has no rales. He exhibits no tenderness.  GI: He exhibits no distension and no mass. There is no tenderness.  Genitourinary: Rectum normal, prostate normal and penis normal. Guaiac negative stool. No penile tenderness.  Musculoskeletal: Normal range of motion. He exhibits no edema and no tenderness.  Lymphadenopathy:    He has no cervical adenopathy.  Neurological: He is alert and oriented to person, place, and time. He has normal reflexes. No cranial nerve deficit. Coordination normal.  Skin: No rash noted. No erythema. No pallor.  Psychiatric: He has a normal mood and affect. His behavior is normal. Judgment and thought content normal.       Labs reviewed: Appointment on 07/09/2013  Component Date Value Ref Range Status  . Cholesterol, Total 07/09/2013 169  100 - 199 mg/dL Final  . Triglycerides 07/09/2013 122  0 - 149 mg/dL Final  . HDL 07/09/2013 43  >39 mg/dL Final   Comment: According to ATP-III Guidelines, HDL-C >59 mg/dL is considered a                          negative risk factor for CHD.  Marland Kitchen VLDL Cholesterol Cal 07/09/2013 24  5 - 40 mg/dL Final  . LDL Calculated 07/09/2013 102* 0 - 99 mg/dL Final  . Chol/HDL Ratio 07/09/2013 3.9  0.0 - 5.0 ratio units Final   Comment:                                   T. Chol/HDL Ratio                                                                      Men  Women  1/2 Avg.Risk  3.4    3.3                                                            Avg.Risk  5.0    4.4                                                         2X Avg.Risk  9.6    7.1                                                         3X Avg.Risk 23.4   11.0  . Hemoglobin A1C 07/09/2013 6.4* 4.8 - 5.6 % Final    Comment:          Increased risk for diabetes: 5.7 - 6.4                                   Diabetes: >6.4                                   Glycemic control for adults with diabetes: <7.0  . Estimated average glucose 07/09/2013 137   Final  . Glucose 07/09/2013 91  65 - 99 mg/dL Final  . BUN 07/09/2013 33* 8 - 27 mg/dL Final  . Creatinine, Ser 07/09/2013 1.29* 0.76 - 1.27 mg/dL Final  . GFR calc non Af Amer 07/09/2013 51* >59 mL/min/1.73 Final  . GFR calc Af Amer 07/09/2013 59* >59 mL/min/1.73 Final  . BUN/Creatinine Ratio 07/09/2013 26* 10 - 22 Final  . Sodium 07/09/2013 141  134 - 144 mmol/L Final  . Potassium 07/09/2013 4.2  3.5 - 5.2 mmol/L Final  . Chloride 07/09/2013 102  97 - 108 mmol/L Final  . CO2 07/09/2013 23  18 - 29 mmol/L Final  . Calcium 07/09/2013 9.5  8.6 - 10.2 mg/dL Final  . Total Protein 07/09/2013 6.6  6.0 - 8.5 g/dL Final  . Albumin 07/09/2013 4.4  3.5 - 4.7 g/dL Final  . Globulin, Total 07/09/2013 2.2  1.5 - 4.5 g/dL Final  . Albumin/Globulin Ratio 07/09/2013 2.0  1.1 - 2.5 Final  . Total Bilirubin 07/09/2013 0.5  0.0 - 1.2 mg/dL Final  . Alkaline Phosphatase 07/09/2013 68  39 - 117 IU/L Final  . AST 07/09/2013 18  0 - 40 IU/L Final  . ALT 07/09/2013 13  0 - 44 IU/L Final  . TSH 07/09/2013 4.570* 0.450 - 4.500 uIU/mL Final     Assessment/Plan 1. Hypertension controlled  2. Type II or unspecified type diabetes mellitus without mention of complication, not stated as uncontrolled controlled  3. Unspecified hypothyroidism Compensated. Mild rise in TSH. No change in medication yet.  4. Hyperlipidemia controlled  5. Insomnia, unspecified - temazepam (RESTORIL) 15 MG capsule; TAKE 1 CAPSULE BY MOUTH AT  BEDTIME AS NEEDED FOR SLEEP  Dispense: 90 capsule; Refill: 1  6. Bradycardia Refer to cardiologist  7. Elevated prostate specific antigen (PSA) Repeat next visit  8. Anxiety state, unspecified observe

## 2013-07-11 NOTE — Patient Instructions (Addendum)
Continue current medications. 

## 2013-07-12 NOTE — Addendum Note (Signed)
Addended by: Logan Bores on: 07/12/2013 08:32 AM   Modules accepted: Orders

## 2013-07-17 DIAGNOSIS — Z0000 Encounter for general adult medical examination without abnormal findings: Secondary | ICD-10-CM | POA: Insufficient documentation

## 2013-07-17 NOTE — Addendum Note (Signed)
Addended by: Estill Dooms on: 07/17/2013 01:24 PM   Modules accepted: Level of Service

## 2013-07-30 ENCOUNTER — Encounter: Payer: Self-pay | Admitting: Internal Medicine

## 2013-08-08 ENCOUNTER — Other Ambulatory Visit: Payer: Self-pay | Admitting: *Deleted

## 2013-08-08 MED ORDER — GLIMEPIRIDE 1 MG PO TABS: ORAL_TABLET | ORAL | Status: DC

## 2013-08-08 NOTE — Telephone Encounter (Signed)
Pharmacy called and stated that they could not cut Glipizide in half and needed something else called into pharmacy. I spoke with Dr. Nyoka Cowden and he changed it to Amaryl 1mg  once daily and pharmacy aware.

## 2013-08-17 ENCOUNTER — Encounter (INDEPENDENT_AMBULATORY_CARE_PROVIDER_SITE_OTHER): Payer: Medicare Other

## 2013-08-17 ENCOUNTER — Encounter: Payer: Self-pay | Admitting: Radiology

## 2013-08-17 ENCOUNTER — Ambulatory Visit (INDEPENDENT_AMBULATORY_CARE_PROVIDER_SITE_OTHER): Payer: Medicare Other | Admitting: Cardiology

## 2013-08-17 ENCOUNTER — Encounter (INDEPENDENT_AMBULATORY_CARE_PROVIDER_SITE_OTHER): Payer: Self-pay

## 2013-08-17 ENCOUNTER — Encounter: Payer: Self-pay | Admitting: Cardiology

## 2013-08-17 VITALS — BP 160/70 | HR 50 | Ht 64.0 in | Wt 167.0 lb

## 2013-08-17 DIAGNOSIS — R42 Dizziness and giddiness: Secondary | ICD-10-CM

## 2013-08-17 DIAGNOSIS — R0609 Other forms of dyspnea: Secondary | ICD-10-CM

## 2013-08-17 DIAGNOSIS — R0989 Other specified symptoms and signs involving the circulatory and respiratory systems: Secondary | ICD-10-CM

## 2013-08-17 DIAGNOSIS — R001 Bradycardia, unspecified: Secondary | ICD-10-CM

## 2013-08-17 DIAGNOSIS — I498 Other specified cardiac arrhythmias: Secondary | ICD-10-CM

## 2013-08-17 NOTE — Patient Instructions (Signed)
Your physician recommends that you continue on your current medications as directed. Please refer to the Current Medication list given to you today.  Your physician has recommended that you wear a 24 HOUR holter monitor. Holter monitors are medical devices that record the heart's electrical activity. Doctors most often use these monitors to diagnose arrhythmias. Arrhythmias are problems with the speed or rhythm of the heartbeat. The monitor is a small, portable device. You can wear one while you do your normal daily activities. This is usually used to diagnose what is causing palpitations/syncope (passing out).  Your physician has requested that you have a lexiscan myoview. For further information please visit [email or web address]. Please follow instruction sheet, as given.  Your physician wants you to follow-up in: [place] will receive a reminder letter in the mail two months in advance. If you don't receive a letter, please call our office to schedule the follow-up appointment.

## 2013-08-17 NOTE — Progress Notes (Signed)
Patient ID: Bobby Jensen, male   DOB: 11-05-1927, 80 y.o.   MRN: [id number] E cardio 24hr holter applied

## 2013-08-17 NOTE — Progress Notes (Signed)
Patient ID: Bobby Jensen, male   DOB: 12-Apr-1927, 80 y.o.   MRN: [id number]    Patient Name: Bobby Jensen Date of Encounter: 08/17/2013  Primary Care Provider:  Estill Dooms, MD Primary Cardiologist: Bobby Jensen  Problem List   Past Medical History  Diagnosis Date  . Hypertension 07/08/1998  . Type II or unspecified type diabetes mellitus without mention of complication, not stated as uncontrolled 07/15/2006  . Kidney calculi 08/20/2009  . Unspecified constipation 08/05/2010  . Unspecified hypothyroidism 12/17/2009  . Pain in joint, pelvic region and thigh 12/17/2009  . Rosacea 08/20/2009  . Inguinal hernia without mention of obstruction or gangrene, unilateral or unspecified, (not specified as recurrent) 11/20/2008  . Bladder neck obstruction 03/28/2008  . Other and unspecified hyperlipidemia 12/23/2004  . Other malaise and fatigue 12/23/2004  . Pain in joint, shoulder region 11/01/2003  . Insomnia, unspecified 11/01/2003  . Peptic ulcer, unspecified site, unspecified as acute or chronic, without mention of hemorrhage, perforation, or obstruction 06/25/2003  . 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 Elevated prostate specific antigen (PSA) 03/15/1998  . Gastric ulcer, unspecified as acute or chronic, without mention of hemorrhage, perforation, or obstruction 03/15/1993  . Hypertrophy of prostate without urinary obstruction and other lower urinary tract symptoms (LUTS) 03/15/1989  . Contact with or exposure to tuberculosis 03/15/1992  . Personal history of malaria 03/16/1939   Past Surgical History  Procedure Laterality Date  . Prostate surgery    . Total hip arthroplasty Right 07/10/2009    Bobby Jensen  . Appendectomy  1956  . Pilonidal cyst excision  1963  . Hemorroidectomy  06/2002    Bobby Jensen  . Hernia repair Bilateral 11/2008    inguinal    Allergies  Allergies  Allergen Reactions  . Fioricet [Butalbital-Apap-Caffeine]   . Other Other (See  Comments)    Profen II DM= urinary outlet obstruction  . Tussin [Guaifenesin]     HPI  A very pleasant younger appearing 80 year old gentleman who is originally from [place] who was referred to [place] by his primary care physician for concern of bradycardia and risk stratification for coronary artery disease. The patient has a history of known insulin-dependent diabetes mellitus, hypertension and hyperlipidemia. He has family history of premature coronary artery disease, his father died of myocardial infarction in his 49s. His mother lived to age of 38 his 109 year old sister still alive. The patient is very active physically and intellectually. He has worked until he was 80 traveling around the [place]. He is a Social research officer, government. Golf on a regular basis and other than he is hip replacement he has no limitations in regards to chest pain, shortness of breath, palpitations or syncope. He occasionally feels dizzy but has never experienced syncopal episode. He is very compliant with his medications.  Home Medications  Prior to Admission medications   Medication Sig Start Date End Date Taking? Authorizing Provider  Blood Glucose Monitoring Suppl (ONE TOUCH ULTRA 2) W/DEVICE KIT Check blood sugar once daily as directed DX 250.00 03/06/13  Yes Bobby Dooms, MD  Cholecalciferol (VITAMIN D PO) Take 1,000 Units by mouth daily.    Yes Historical Provider, MD  glimepiride (AMARYL) 1 MG tablet Take one tablet by mouth once daily to control blood sugar 08/08/13  Yes Bobby Dooms, MD  Glucose Blood (FREESTYLE LITE TEST VI)  02/24/13  Yes Historical Provider, MD  glucose blood (ONE TOUCH ULTRA TEST) test strip Check blood sugar twice daily 07/11/13  Yes Bobby Jensen  Bobby Courts, MD  levothyroxine (SYNTHROID, LEVOTHROID) 50 MCG tablet Take one tablet once a day for thyroid supplement 09/06/12  Yes Bobby Dooms, MD  losartan-hydrochlorothiazide [facility]) 100-25 MG per tablet Take one tablet by mouth once daily to control blood  pressure 04/30/13  Yes Bobby L Reed, DO  ONETOUCH DELICA LANCETS 76P MISC Check blood sugar once daily as directed DX 250.00 03/06/13  Yes Bobby Dooms, MD  simvastatin (ZOCOR) 40 MG tablet TAKE 1/2 TABLET BY MOUTH AT BEDTIME TO LOWER CHOLESTEROL   Yes Bobby Dooms, MD  temazepam (RESTORIL) 15 MG capsule TAKE 1 CAPSULE BY MOUTH AT BEDTIME AS NEEDED FOR SLEEP 07/11/13  Yes Bobby Dooms, MD  triamcinolone cream (KENALOG) 0.1 % Apply topically 2 (two) times daily. Apply to rash as needed. 09/06/12  Yes Bobby Dooms, MD    Family History  Family History  Problem Relation Age of Onset  . Heart disease Father     Social History  History   Social History  . Marital Status: Married    Spouse Name: N/A    Number of Children: N/A  . Years of Education: N/A   Occupational History  . Not on file.   Social History Main Topics  . Smoking status: Former Smoker    Quit date: 07/31/1978  . Smokeless tobacco: Not on file  . Alcohol Use: Yes  . Drug Use: No  . Sexual Activity: Yes    Partners: Female   Other Topics Concern  . Not on file   Social History Narrative  . No narrative on file     Review of Systems, as per HPI, otherwise negative General:  No chills, fever, night sweats or weight changes.  Cardiovascular:  No chest pain, dyspnea on exertion, edema, orthopnea, palpitations, paroxysmal nocturnal dyspnea. Dermatological: No rash, lesions/masses Respiratory: No cough, dyspnea Urologic: No hematuria, dysuria Abdominal:   No nausea, vomiting, diarrhea, bright red blood per rectum, melena, or hematemesis Neurologic:  No visual changes, wkns, changes in mental status. All other systems reviewed and are otherwise negative except as noted above.  Physical Exam  Blood pressure 160/70, pulse 50, height 5' 4"  (1.626 m), weight 167 lb (75.751 kg).  General: Pleasant, NAD Psych: Normal affect. Neuro: Alert and oriented X 3. Moves all extremities spontaneously. HEENT:  Normal  Neck: Supple without bruits or JVD. Lungs:  Resp regular and unlabored, CTA. Heart: RRR no s3, s4, or murmurs. Abdomen: Soft, non-tender, non-distended, BS + x 4.  Extremities: No clubbing, cyanosis or edema. DP/PT/Radials 2+ and equal bilaterally.  Labs:  No results found for this basename: CKTOTAL, CKMB, TROPONINI,  in the last 72 hours Lab Results  Component Value Date   WBC 7.1 07/14/2009   HGB 16.0 07/31/2011   HCT 47.0 07/31/2011   MCV 91.2 07/14/2009   PLT 164 REPEATED TO VERIFY DELTA CHECK NOTED 07/14/2009    No results found for this basename: DDIMER   No components found with this basename: POCBNP,     Component Value Date/Time   NA 141 07/09/2013 0841   NA 141 07/31/2011 1642   K 4.2 07/09/2013 0841   CL 102 07/09/2013 0841   CO2 23 07/09/2013 0841   GLUCOSE 91 07/09/2013 0841   GLUCOSE 158* 07/31/2011 1642   BUN 33* 07/09/2013 0841   BUN 35* 07/31/2011 1642   CREATININE 1.29* 07/09/2013 0841   CALCIUM 9.5 07/09/2013 0841   PROT 6.6 07/09/2013 0841   PROT 6.9 07/03/2009 1130  ALBUMIN 4.0 07/03/2009 1130   AST 18 07/09/2013 0841   ALT 13 07/09/2013 0841   ALKPHOS 68 07/09/2013 0841   BILITOT 0.5 07/09/2013 0841   GFRNONAA 51* 07/09/2013 0841   GFRAA 59* 07/09/2013 0841   Lab Results  Component Value Date   HDL 43 07/09/2013   LDLCALC 102* 07/09/2013   TRIG 122 07/09/2013    Accessory Clinical Findings  Echocardiogram - none  ECG - sinus bradycardia, 53 beats per minute, sinus pauses and frequent PACs.    Assessment & Plan  80 year old male  1. bradycardia with sinus pauses and frequent PACs. We will perform 24-hour Holter monitoring to evaluate for any signs of sick sinus syndrome.  2. risk stratification - patient is very active for his age and fairly asymptomatic however significant risk factors and family history, therefore we will order a [place] nuclear stress test to evaluate for ischemia.  3. Hypertension - elevated today however patient hasn't taken his  meds today yet. He states that it's always controlled at home.  4. Hyperlipidemia - HDL 43, 3 of smokeless rides 122 and LDL 102 desired level the less than 70 with history of diabetes. He was recommended to increase simvastatin to 40 mg daily.  Followup in 1 year   Bobby Spark, MD, [facility] 08/17/2013, 11:26 AM

## 2013-08-23 ENCOUNTER — Telehealth: Payer: Self-pay | Admitting: *Deleted

## 2013-08-23 NOTE — Telephone Encounter (Signed)
Contacted pt about ecardio results. Per Dr Meda Coffee no medications is needed at this time for palpitations.  Pt verbalized understanding and agrees with this plan.

## 2013-08-30 ENCOUNTER — Ambulatory Visit (HOSPITAL_COMMUNITY): Payer: Medicare Other | Attending: Cardiology | Admitting: Radiology

## 2013-08-30 VITALS — BP 138/70 | HR 49 | Ht 65.0 in | Wt 164.0 lb

## 2013-08-30 DIAGNOSIS — R0989 Other specified symptoms and signs involving the circulatory and respiratory systems: Secondary | ICD-10-CM | POA: Insufficient documentation

## 2013-08-30 DIAGNOSIS — Z8249 Family history of ischemic heart disease and other diseases of the circulatory system: Secondary | ICD-10-CM | POA: Insufficient documentation

## 2013-08-30 DIAGNOSIS — Z87891 Personal history of nicotine dependence: Secondary | ICD-10-CM | POA: Insufficient documentation

## 2013-08-30 DIAGNOSIS — R42 Dizziness and giddiness: Secondary | ICD-10-CM

## 2013-08-30 DIAGNOSIS — R001 Bradycardia, unspecified: Secondary | ICD-10-CM

## 2013-08-30 DIAGNOSIS — I10 Essential (primary) hypertension: Secondary | ICD-10-CM | POA: Insufficient documentation

## 2013-08-30 DIAGNOSIS — R0609 Other forms of dyspnea: Secondary | ICD-10-CM | POA: Insufficient documentation

## 2013-08-30 DIAGNOSIS — E119 Type 2 diabetes mellitus without complications: Secondary | ICD-10-CM | POA: Insufficient documentation

## 2013-08-30 DIAGNOSIS — R0602 Shortness of breath: Secondary | ICD-10-CM

## 2013-08-30 MED ORDER — TECHNETIUM TC 99M SESTAMIBI GENERIC - CARDIOLITE
33.0000 | Freq: Once | INTRAVENOUS | Status: AC | PRN
  Administered 2013-08-30: 33 via INTRAVENOUS

## 2013-08-30 MED ORDER — TECHNETIUM TC 99M SESTAMIBI GENERIC - CARDIOLITE
11.0000 | Freq: Once | INTRAVENOUS | Status: AC | PRN
  Administered 2013-08-30: 11 via INTRAVENOUS

## 2013-08-30 MED ORDER — REGADENOSON 0.4 MG/5ML IV SOLN
0.4000 mg | Freq: Once | INTRAVENOUS | Status: AC
  Administered 2013-08-30: 0.4 mg via INTRAVENOUS

## 2013-08-30 NOTE — Progress Notes (Signed)
[facility] [address] [place], [place] [address] [phone]    Cardiology Nuclear Med Bobby Jensen  is a 80 y.o. male     MRN : [id number]     DOB: 1927-12-05  Procedure Date: 08/30/2013  Nuclear Med Background Indication for Stress Test:  Evaluation for Ischemia History:  No known CAD Cardiac Risk Factors: Family History - CAD, History of Smoking, Hypertension, Lipids and NIDDM  Symptoms:  Dizziness and DOE   Nuclear Pre-Procedure Caffeine/Decaff Intake:  None> 12 hrs NPO After: 6:00am   Lungs:  clear O2 Sat: 96% on room air. IV 0.9% NS with Angio Cath:  22g  IV Site: R Antecubital x 1, tolerated well IV Started by:  Irven Baltimore, RN  Chest Size (in):  40 Cup Size: n/a  Height: 5\' 5"  (1.651 m)  Weight:  164 lb (74.39 kg)  BMI:  Body mass index is 27.29 kg/(m^2). Tech Comments:  Hyzaar and Amaryl with breakfast. Irven Baltimore, RN.    Nuclear Med Study 1 or 2 day study: 1 day  Stress Test Type:  Treadmill/Lexiscan  Reading MD: N/A  Order Authorizing Ulisses Vondrak:  Ena Dawley, MD  Resting Radionuclide: Technetium 46m Sestamibi  Resting Radionuclide Dose: 11.0 mCi   Stress Radionuclide:  Technetium 62m Sestamibi  Stress Radionuclide Dose: 33.0 mCi           Stress Protocol Rest HR: 49 Stress HR: 96  Rest BP: 138/70 Stress BP: 136/71  Exercise Time (min): n/a METS: n/a           Dose of Adenosine (mg):  n/a Dose of Lexiscan: 0.4 mg  Dose of Atropine (mg): n/a Dose of Dobutamine: n/a mcg/kg/min (at max HR)  Stress Test Technologist: Glade Lloyd, BS-ES  Nuclear Technologist:  Charlton Amor, CNMT     Rest Procedure:  Myocardial perfusion imaging was performed at rest 45 minutes following the intravenous administration of Technetium 80m Sestamibi. Rest ECG: Normal sinus rhythm. Normal EKG  Stress Procedure:  The patient received IV Lexiscan 0.4 mg over 15-seconds with concurrent low level exercise and then Technetium  2m Sestamibi was injected at 30-seconds while the patient continued walking one more minute.  Quantitative spect images were obtained after a 45-minute delay.  During the infusion of Lexiscan the patient experienced chest tightness that resolved in recovery.  Stress ECG: No significant change from baseline ECG  QPS Raw Data Images:  Study is done with the arms down Stress Images:  There is a medium/large area of moderately severe decreased activity affecting the base/mid inferolateral segments and the base/mid anterolateral segments and the apical lateral segment. Rest Images:  The rest images are similar to the stress images with slightly better perfusion at the base of the anterolateral wall. Subtraction (SDS):  Mild reversibility at the base of the anterolateral wall. Transient Ischemic Dilatation (Normal <1.22):  1.01 Lung/Heart Ratio (Normal <0.45):  0.48  Quantitative Gated Spect Images QGS EDV:  94 ml QGS ESV:  35 ml  Impression Exercise Capacity:  Lexiscan with low level exercise. BP Response:  Normal blood pressure response. Clinical Symptoms:  Chest pain ECG Impression:  No significant ST segment change suggestive of ischemia. Comparison with Prior Nuclear Study: No images to compare  Overall Impression:  This is a low/medium risk scan. There is scar affecting the entire inferolateral wall and the mid/apical anterolateral wall. There is peri-infarct ischemia at the base of the anterolateral wall.  LV Ejection Fraction: 63%.  LV Wall Motion:  Wall motion is good overall. There is decreased motion in the lateral wall.  Dola Argyle, MD

## 2013-09-05 ENCOUNTER — Telehealth: Payer: Self-pay | Admitting: Cardiology

## 2013-09-05 DIAGNOSIS — E785 Hyperlipidemia, unspecified: Secondary | ICD-10-CM

## 2013-09-05 DIAGNOSIS — I10 Essential (primary) hypertension: Secondary | ICD-10-CM

## 2013-09-05 DIAGNOSIS — R001 Bradycardia, unspecified: Secondary | ICD-10-CM

## 2013-09-05 NOTE — Telephone Encounter (Signed)
Went over preliminary report of lexiscan with pt, but did inform him that Dr Meda Coffee has not reviewed this and given her final report.  Informed pt that she will be out of the office until Monday.  Informed pt that when she reviews this test and gives her response, I will then contact him back with this.  Pt verbalized understanding and very grateful for all the assistance provided.  Pt agrees with this plan.  I will route this message to Dr Meda Coffee for her review.

## 2013-09-05 NOTE — Telephone Encounter (Signed)
New message ° ° ° ° °Pt want stress test results. °

## 2013-09-10 NOTE — Telephone Encounter (Signed)
Could you schedule him for CMP sometimes in the next week and schedule a follow up appointment in 6 months? THank you, K

## 2013-09-10 NOTE — Addendum Note (Signed)
Addended by: Nuala Alpha on: 09/10/2013 05:28 PM   Modules accepted: Orders

## 2013-09-10 NOTE — Telephone Encounter (Signed)
Made pt aware that per Dr Meda Coffee this pt should have a CMP checked next Wednesday on September 19, 2013, and follow-up in 6 months with Dr Meda Coffee.  Pt verbalized understanding and agrees with this plan.

## 2013-09-19 ENCOUNTER — Other Ambulatory Visit (INDEPENDENT_AMBULATORY_CARE_PROVIDER_SITE_OTHER): Payer: Medicare Other

## 2013-09-19 DIAGNOSIS — I10 Essential (primary) hypertension: Secondary | ICD-10-CM

## 2013-09-19 DIAGNOSIS — E785 Hyperlipidemia, unspecified: Secondary | ICD-10-CM

## 2013-09-19 DIAGNOSIS — I498 Other specified cardiac arrhythmias: Secondary | ICD-10-CM

## 2013-09-19 DIAGNOSIS — R001 Bradycardia, unspecified: Secondary | ICD-10-CM

## 2013-09-19 LAB — COMPREHENSIVE METABOLIC PANEL
ALT: 16 U/L (ref 0–53)
AST: 20 U/L (ref 0–37)
Albumin: 4 g/dL (ref 3.5–5.2)
Alkaline Phosphatase: 54 U/L (ref 39–117)
BUN: 25 mg/dL — ABNORMAL HIGH (ref 6–23)
CO2: 25 mEq/L (ref 19–32)
Calcium: 9.2 mg/dL (ref 8.4–10.5)
Chloride: 107 mEq/L (ref 96–112)
Creatinine, Ser: 1.2 mg/dL (ref 0.4–1.5)
GFR: 61.91 mL/min (ref >60.00)
Glucose, Bld: 99 mg/dL (ref 70–99)
Potassium: 3.9 mEq/L (ref 3.5–5.1)
Sodium: 139 mEq/L (ref 135–145)
Total Bilirubin: 0.5 mg/dL (ref 0.2–1.2)
Total Protein: 7 g/dL (ref 6.0–8.3)

## 2013-09-26 ENCOUNTER — Other Ambulatory Visit: Payer: Self-pay | Admitting: Internal Medicine

## 2013-10-16 ENCOUNTER — Other Ambulatory Visit: Payer: Self-pay | Admitting: Internal Medicine

## 2013-12-25 ENCOUNTER — Other Ambulatory Visit: Payer: Self-pay | Admitting: Internal Medicine

## 2014-01-10 ENCOUNTER — Other Ambulatory Visit: Payer: Self-pay | Admitting: Internal Medicine

## 2014-01-11 ENCOUNTER — Other Ambulatory Visit: Payer: Medicare Other

## 2014-01-11 DIAGNOSIS — E785 Hyperlipidemia, unspecified: Secondary | ICD-10-CM

## 2014-01-11 DIAGNOSIS — E119 Type 2 diabetes mellitus without complications: Secondary | ICD-10-CM

## 2014-01-12 LAB — COMPREHENSIVE METABOLIC PANEL
A/G RATIO: 2.3 (ref 1.1–2.5)
ALT: 12 IU/L (ref 0–44)
AST: 13 IU/L (ref 0–40)
Albumin: 4.6 g/dL (ref 3.5–4.7)
Alkaline Phosphatase: 62 IU/L (ref 39–117)
BUN/Creatinine Ratio: 26 — ABNORMAL HIGH (ref 10–22)
BUN: 34 mg/dL — AB (ref 8–27)
CALCIUM: 9.5 mg/dL (ref 8.6–10.2)
CO2: 25 mmol/L (ref 18–29)
CREATININE: 1.31 mg/dL — AB (ref 0.76–1.27)
Chloride: 99 mmol/L (ref 97–108)
GFR calc Af Amer: 57 mL/min/{1.73_m2} — ABNORMAL LOW (ref >59)
GFR, EST NON AFRICAN AMERICAN: 50 mL/min/{1.73_m2} — AB (ref >59)
GLOBULIN, TOTAL: 2 g/dL (ref 1.5–4.5)
GLUCOSE: 102 mg/dL — AB (ref 65–99)
Potassium: 4.4 mmol/L (ref 3.5–5.2)
SODIUM: 139 mmol/L (ref 134–144)
TOTAL PROTEIN: 6.6 g/dL (ref 6.0–8.5)
Total Bilirubin: 0.6 mg/dL (ref 0.0–1.2)

## 2014-01-12 LAB — LIPID PANEL
CHOL/HDL RATIO: 3.2 ratio (ref 0.0–5.0)
Cholesterol, Total: 183 mg/dL (ref 100–199)
HDL: 57 mg/dL (ref >39)
LDL Calculated: 109 mg/dL — ABNORMAL HIGH (ref 0–99)
Triglycerides: 86 mg/dL (ref 0–149)
VLDL CHOLESTEROL CAL: 17 mg/dL (ref 5–40)

## 2014-01-12 LAB — HEMOGLOBIN A1C
Est. average glucose Bld gHb Est-mCnc: 131 mg/dL
HEMOGLOBIN A1C: 6.2 % — AB (ref 4.8–5.6)

## 2014-01-16 ENCOUNTER — Ambulatory Visit (INDEPENDENT_AMBULATORY_CARE_PROVIDER_SITE_OTHER): Payer: Medicare Other | Admitting: Internal Medicine

## 2014-01-16 ENCOUNTER — Encounter: Payer: Self-pay | Admitting: Internal Medicine

## 2014-01-16 VITALS — BP 118/78 | HR 54 | Temp 97.7°F | Resp 10 | Wt 165.5 lb

## 2014-01-16 DIAGNOSIS — R001 Bradycardia, unspecified: Secondary | ICD-10-CM

## 2014-01-16 DIAGNOSIS — G4700 Insomnia, unspecified: Secondary | ICD-10-CM

## 2014-01-16 DIAGNOSIS — E1122 Type 2 diabetes mellitus with diabetic chronic kidney disease: Secondary | ICD-10-CM

## 2014-01-16 DIAGNOSIS — E785 Hyperlipidemia, unspecified: Secondary | ICD-10-CM

## 2014-01-16 DIAGNOSIS — N183 Chronic kidney disease, stage 3 unspecified: Secondary | ICD-10-CM

## 2014-01-16 DIAGNOSIS — F411 Generalized anxiety disorder: Secondary | ICD-10-CM

## 2014-01-16 DIAGNOSIS — I10 Essential (primary) hypertension: Secondary | ICD-10-CM

## 2014-01-16 MED ORDER — ALPRAZOLAM 0.25 MG PO TABS: 0.2500 mg | ORAL_TABLET | Freq: Two times a day (BID) | ORAL | Status: DC | PRN

## 2014-01-16 MED ORDER — TRAZODONE HCL 50 MG PO TABS: 25.0000 mg | ORAL_TABLET | Freq: Every evening | ORAL | Status: DC | PRN

## 2014-01-16 NOTE — Progress Notes (Signed)
Patient ID: Bobby Jensen, male   DOB: 04-22-1927, 80 y.o.   MRN: [id number]    Facility  [facility]    Place of Service:   OFFICE   Allergies  Allergen Reactions  . Fioricet [Butalbital-Apap-Caffeine]     Unaware of this allergy per patient.   . Other Other (See Comments)    Profen II DM= urinary outlet obstruction Unaware of this allergy per patient.  [name] [Guaifenesin]     Unaware of this allergy per patient.    Chief Complaint  Patient presents with  . Medical Management of Chronic Issues    6 month follow-up, discuss labs (copy printed)   . Medication Management    Discuss alternatives to temazepam     HPI:  Bradycardia: Had another episode of diaphoresis and chest tightness yesterday. Denies shortness of breath, nausea or chest pain with episodes. Denies DOE. Has been evaluated by Dr Meda Coffee with Cardiology for same. Lexiscan and Holter monitoring done.   Essential hypertension: Controlled  Type II diabetes mellitus with stage 3 chronic kidney disease: Stable  Insomnia: Stable on Temazepam for last 30 years. Unfortunately insurance will no longer cover.  Hyperlipidemia: Stable  Anxiety: Worries about his wife, who is struggling with colon ca. Reports irritability as well   Medications: Patient's Medications  New Prescriptions   No medications on file  Previous Medications   BLOOD GLUCOSE MONITORING SUPPL (ONE TOUCH ULTRA 2) W/DEVICE KIT    Check blood sugar once daily as directed DX 250.00   CHOLECALCIFEROL (VITAMIN D PO)    Take 1,000 Units by mouth daily.    GLIMEPIRIDE (AMARYL) 1 MG TABLET    Take one tablet by mouth once daily to control blood sugar   GLUCOSE BLOOD (FREESTYLE LITE TEST VI)       GLUCOSE BLOOD (ONE TOUCH ULTRA TEST) TEST STRIP    Check blood sugar twice daily   LEVOTHYROXINE (SYNTHROID, LEVOTHROID) 50 MCG TABLET    TAKE 1 TABLET BY MOUTH ONCE DAILY FOR THYROID SUPPLEMENT   LOSARTAN-HYDROCHLOROTHIAZIDE (HYZAAR) 100-25 MG PER TABLET    Take  one tablet by mouth once daily to control blood pressure   ONETOUCH DELICA LANCETS 57D MISC    Check blood sugar once daily as directed DX 250.00   SIMVASTATIN (ZOCOR) 40 MG TABLET    Take 1/2 tablet by mouth at bedtime daily   TEMAZEPAM (RESTORIL) 15 MG CAPSULE    TAKE 1 CAPSULE BY MOUTH AT BEDTIME AS NEEDED FOR SLEEP   TRIAMCINOLONE CREAM (KENALOG) 0.1 %    Apply topically 2 (two) times daily. Apply to rash as needed.  Modified Medications   No medications on file  Discontinued Medications   No medications on file     Review of Systems  Constitutional: Negative.  Negative for fever, fatigue and unexpected weight change.  HENT: Negative.   Eyes: Positive for visual disturbance (corrective lenses).  Respiratory: Negative for cough and shortness of breath.   Cardiovascular: Positive for chest pain (tightness). Negative for palpitations and leg swelling.  Gastrointestinal: Negative for diarrhea and constipation.  Endocrine:       Diabetic.  Genitourinary: Negative.   Musculoskeletal: Positive for arthralgias. Negative for myalgias, back pain and joint swelling.       Unsteady gait. Denies arthritis. Chronic discomfort in the right hip area.  Skin: Negative.   Allergic/Immunologic: Negative.   Neurological: Positive for dizziness. Negative for weakness and headaches.       Some difficulty with balance. Has  a slightly unsteady gait.  Hematological: Negative.   Psychiatric/Behavioral:       Chronic mild anxiety. Worries about his wife. Reports recent irritability    Filed Vitals:   01/16/14 1447  BP: 118/78  Pulse: 54  Temp: 97.7 F (36.5 C)  TempSrc: Oral  Resp: 10  Weight: 165 lb 8 oz (75.07 kg)  SpO2: 97%   Body mass index is 27.54 kg/(m^2).  Physical Exam  Constitutional: He is oriented to person, place, and time. He appears well-developed and well-nourished. No distress.  Mildly overweight.  HENT:  Head: Normocephalic and atraumatic.  Right Ear: External ear normal.   Left Ear: External ear normal.  Nose: Nose normal.  Mouth/Throat: Oropharynx is clear and moist.  Eyes: Conjunctivae and EOM are normal. Pupils are equal, round, and reactive to light.  Wears corrective lenses. Reduced visual acuity bilaterally.  Neck: Neck supple. No JVD present. No tracheal deviation present. No thyromegaly present.  Cardiovascular: Regular rhythm, normal heart sounds and intact distal pulses.  Bradycardia present.  Exam reveals no gallop and no friction rub.   No murmur heard. Pulmonary/Chest: Breath sounds normal. No respiratory distress. He has no wheezes. He has no rales. He exhibits no tenderness.  Abdominal: Bowel sounds are normal. He exhibits no distension and no mass. There is no tenderness.  Musculoskeletal: Normal range of motion. He exhibits no edema.  Tenderness right hip. Mild gait instability.  Lymphadenopathy:    He has no cervical adenopathy.  Neurological: He is alert and oriented to person, place, and time. No cranial nerve deficit. Coordination normal.  Normal response to vibratory testing.  Skin: No rash noted. No erythema. No pallor.  Psychiatric: He has a normal mood and affect. His behavior is normal. Judgment and thought content normal.     Labs reviewed: Appointment on 01/11/2014  Component Date Value Ref Range Status  . Hgb A1c MFr Bld 01/11/2014 6.2* 4.8 - 5.6 % Final   Comment:          Pre-diabetes: 5.7 - 6.4                                   Diabetes: >6.4                                   Glycemic control for adults with diabetes: <7.0  . Est. average glucose Bld gHb Est-m* 01/11/2014 131   Final  . Glucose 01/11/2014 102* 65 - 99 mg/dL Final  . BUN 01/11/2014 34* 8 - 27 mg/dL Final  . Creatinine, Ser 01/11/2014 1.31* 0.76 - 1.27 mg/dL Final  . GFR calc non Af Amer 01/11/2014 50* >59 mL/min/1.73 Final  . GFR calc Af Amer 01/11/2014 57* >59 mL/min/1.73 Final  . BUN/Creatinine Ratio 01/11/2014 26* 10 - 22 Final  . Sodium 01/11/2014  139  134 - 144 mmol/L Final  . Potassium 01/11/2014 4.4  3.5 - 5.2 mmol/L Final  . Chloride 01/11/2014 99  97 - 108 mmol/L Final  . CO2 01/11/2014 25  18 - 29 mmol/L Final  . Calcium 01/11/2014 9.5  8.6 - 10.2 mg/dL Final  . Total Protein 01/11/2014 6.6  6.0 - 8.5 g/dL Final  . Albumin 01/11/2014 4.6  3.5 - 4.7 g/dL Final  . Globulin, Total 01/11/2014 2.0  1.5 - 4.5 g/dL Final  . Albumin/Globulin Ratio 01/11/2014  2.3  1.1 - 2.5 Final  . Total Bilirubin 01/11/2014 0.6  0.0 - 1.2 mg/dL Final  . Alkaline Phosphatase 01/11/2014 62  39 - 117 IU/L Final  . AST 01/11/2014 13  0 - 40 IU/L Final  . ALT 01/11/2014 12  0 - 44 IU/L Final  . Cholesterol, Total 01/11/2014 183  100 - 199 mg/dL Final                 **Please note reference interval change**  . Triglycerides 01/11/2014 86  0 - 149 mg/dL Final                 **Please note reference interval change**  . HDL 01/11/2014 57  >39 mg/dL Final   Comment: According to ATP-III Guidelines, HDL-C >59 mg/dL is considered a                          negative risk factor for CHD.  Marland Kitchen VLDL Cholesterol Cal 01/11/2014 17  5 - 40 mg/dL Final  . LDL Calculated 01/11/2014 109* 0 - 99 mg/dL Final                 **Please note reference interval change**  . Chol/HDL Ratio 01/11/2014 3.2  0.0 - 5.0 ratio units Final   Comment:                                   T. Chol/HDL Ratio                                                                      Men  Women                                                        1/2 Avg.Risk  3.4    3.3                                                            Avg.Risk  5.0    4.4                                                         2X Avg.Risk  9.6    7.1                                                         3X Avg.Risk 23.4   11.0   Lexiscan:  Impression Exercise  Capacity: Lexiscan with low level exercise. BP Response: Normal blood pressure response. Clinical Symptoms: Chest pain ECG Impression: No significant  ST segment change suggestive of ischemia. Comparison with Prior Nuclear Study: No images to compare  Overall Impression: This is a low/medium risk scan. There is scar affecting the entire inferolateral wall and the mid/apical anterolateral wall. There is peri-infarct ischemia at the base of the anterolateral wall.  LV Ejection Fraction: 63%. LV Wall Motion: Wall motion is good overall. There is decreased motion in the lateral wall.  Dola Argyle, MD  Assessment/Plan 1. Bradycardia Stable Follow up with Cardiology in December  2. Essential hypertension Controlled - Basic metabolic panel; Future  3. Type II diabetes mellitus with stage 3 chronic kidney disease Controlled - Hemoglobin A1c; Future - Basic metabolic panel; Future - Microalbumin, urine; Future  4. Insomnia Stable Need to transition to different agent d/t insurance - traZODone (DESYREL) 50 MG tablet; Take 0.5-1 tablets (25-50 mg total) by mouth at bedtime as needed for sleep.  Dispense: 90 tablet; Refill: 3  5. Hyperlipidemia Stable - Lipid panel; Future  6. Anxiety state - ALPRAZolam (XANAX) 0.25 MG tablet; Take 1 tablet (0.25 mg total) by mouth 2 (two) times daily as needed for anxiety.  Dispense: 100 tablet; Refill: 3

## 2014-01-18 LAB — PSA: PSA: 2.1 ng/mL (ref 0.0–4.0)

## 2014-01-18 LAB — SPECIMEN STATUS REPORT

## 2014-02-06 ENCOUNTER — Other Ambulatory Visit: Payer: Self-pay | Admitting: Internal Medicine

## 2014-03-01 ENCOUNTER — Ambulatory Visit (INDEPENDENT_AMBULATORY_CARE_PROVIDER_SITE_OTHER): Payer: Medicare Other | Admitting: Cardiology

## 2014-03-01 ENCOUNTER — Encounter: Payer: Self-pay | Admitting: Cardiology

## 2014-03-01 VITALS — BP 120/60 | HR 64 | Ht 65.0 in | Wt 163.0 lb

## 2014-03-01 DIAGNOSIS — R001 Bradycardia, unspecified: Secondary | ICD-10-CM

## 2014-03-01 DIAGNOSIS — I10 Essential (primary) hypertension: Secondary | ICD-10-CM

## 2014-03-01 DIAGNOSIS — Z8249 Family history of ischemic heart disease and other diseases of the circulatory system: Secondary | ICD-10-CM

## 2014-03-01 DIAGNOSIS — R9439 Abnormal result of other cardiovascular function study: Secondary | ICD-10-CM

## 2014-03-01 DIAGNOSIS — E785 Hyperlipidemia, unspecified: Secondary | ICD-10-CM

## 2014-03-01 NOTE — Patient Instructions (Addendum)
Your physician wants you to follow-up in:     [place] will receive a reminder letter in the mail two months in advance. If you don't receive a letter, please call our office to schedule the follow-up appointment. Your physician recommends that you continue on your current medications as directed. Please refer to the Current Medication list given to you today. START BABY ASPIRIN  1  EVERY DAY

## 2014-03-01 NOTE — Progress Notes (Signed)
Patient ID: Bobby Jensen, male   DOB: [birth year], 80 y.o.   MRN: [id number]    Patient Name: Bobby Jensen Date of Encounter: 03/01/2014  Primary Care Provider:  Estill Dooms, MD Primary Cardiologist: Dorothy Spark  Problem List   Past Medical History  Diagnosis Date  . Hypertension 07/08/1998  . Type II or unspecified type diabetes mellitus without mention of complication, not stated as uncontrolled 07/15/2006  . Kidney calculi 08/20/2009  . Unspecified constipation 08/05/2010  . Unspecified hypothyroidism 12/17/2009  . Pain in joint, pelvic region and thigh 12/17/2009  . Rosacea 08/20/2009  . Inguinal hernia without mention of obstruction or gangrene, unilateral or unspecified, (not specified as recurrent) 11/20/2008  . Bladder neck obstruction 03/28/2008  . Other and unspecified hyperlipidemia 12/23/2004  . Other malaise and fatigue 12/23/2004  . Pain in joint, shoulder region 11/01/2003  . Insomnia, unspecified 11/01/2003  . Peptic ulcer, unspecified site, unspecified as acute or chronic, without mention of hemorrhage, perforation, or obstruction 06/25/2003  . 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 Elevated prostate specific antigen (PSA) 03/15/1998  . Gastric ulcer, unspecified as acute or chronic, without mention of hemorrhage, perforation, or obstruction 03/15/1993  . Hypertrophy of prostate without urinary obstruction and other lower urinary tract symptoms (LUTS) 03/15/1989  . Contact with or exposure to tuberculosis 03/15/1992  . Personal history of malaria 03/16/1939   Past Surgical History  Procedure Laterality Date  . Prostate surgery    . Total hip arthroplasty Right 07/10/2009    Dr. Telford Nab  . Appendectomy  1956  . Pilonidal cyst excision  1963  . Hemorroidectomy  06/2002    Dr. Lennie Hummer  . Hernia repair Bilateral 11/2008    inguinal    Allergies  Allergies  Allergen Reactions  . Fioricet [Butalbital-Apap-Caffeine]     Unaware of  this allergy per patient.   . Other Other (See Comments)    Profen II DM= urinary outlet obstruction Unaware of this allergy per patient.  [name] [Guaifenesin]     Unaware of this allergy per patient.    HPI  A very pleasant younger appearing 80 year old gentleman who is originally from [place] who was referred to [place] by his primary care physician for concern of bradycardia and risk stratification for coronary artery disease. The patient has a history of known insulin-dependent diabetes mellitus, hypertension and hyperlipidemia. He has family history of premature coronary artery disease, his father died of myocardial infarction in his 31s. His mother lived to age of 19 his 40 year old sister still alive. The patient is very active physically and intellectually. He has worked until he was 33 traveling around the [place]. He is a Social research officer, government. Golf on a regular basis and other than he is hip replacement he has no limitations in regards to chest pain, shortness of breath, palpitations or syncope. He occasionally feels dizzy but has never experienced syncopal episode. He is very compliant with his medications.  03/01/2014 - the patient is coming after 6 months. Meanwhile he was evaluated for possible sick sinus syndrome that was ruled out on the 24-hour Holter monitor. It only showed 2.1 second part at night otherwise no significant bradycardia and no other arrhythmia such as atrial fibrillation were identified. The patient states that he continues being very active playing called and denies any chest pain or shortness of breath. His most limiting factor right now is his hip pain for which he is being evaluated. He stays active helping at home on for example  yesterday he vacuumed three-story house and he denied any symptoms during that. He is very compliant with his medicines. On at the last visit we perform a stress test that showed a scar in the inferolateral wall with mild peri-infarct ischemia,  however considering leg of symptoms we intensified medical therapy and patient continues being asymptomatic.  Home Medications  Prior to Admission medications   Medication Sig Start Date End Date Taking? Authorizing Provider  Blood Glucose Monitoring Suppl (ONE TOUCH ULTRA 2) W/DEVICE KIT Check blood sugar once daily as directed DX 250.00 03/06/13  Yes Estill Dooms, MD  Cholecalciferol (VITAMIN D PO) Take 1,000 Units by mouth daily.    Yes Historical Provider, MD  glimepiride (AMARYL) 1 MG tablet Take one tablet by mouth once daily to control blood sugar 08/08/13  Yes Estill Dooms, MD  Glucose Blood (FREESTYLE LITE TEST VI)  02/24/13  Yes Historical Provider, MD  glucose blood (ONE TOUCH ULTRA TEST) test strip Check blood sugar twice daily 07/11/13  Yes Estill Dooms, MD  levothyroxine (SYNTHROID, LEVOTHROID) 50 MCG tablet Take one tablet once a day for thyroid supplement 09/06/12  Yes Estill Dooms, MD  losartan-hydrochlorothiazide [facility]) 100-25 MG per tablet Take one tablet by mouth once daily to control blood pressure 04/30/13  Yes Tiffany L Reed, DO  ONETOUCH DELICA LANCETS 65B MISC Check blood sugar once daily as directed DX 250.00 03/06/13  Yes Estill Dooms, MD  simvastatin (ZOCOR) 40 MG tablet TAKE 1/2 TABLET BY MOUTH AT BEDTIME TO LOWER CHOLESTEROL   Yes Estill Dooms, MD  temazepam (RESTORIL) 15 MG capsule TAKE 1 CAPSULE BY MOUTH AT BEDTIME AS NEEDED FOR SLEEP 07/11/13  Yes Estill Dooms, MD  triamcinolone cream (KENALOG) 0.1 % Apply topically 2 (two) times daily. Apply to rash as needed. 09/06/12  Yes Estill Dooms, MD    Family History  Family History  Problem Relation Age of Onset  . Heart disease Father     Social History  History   Social History  . Marital Status: Married    Spouse Name: N/A    Number of Children: N/A  . Years of Education: N/A   Occupational History  . Not on file.   Social History Main Topics  . Smoking status: Former Smoker    Quit  date: 07/31/1978  . Smokeless tobacco: Not on file  . Alcohol Use: Yes  . Drug Use: No  . Sexual Activity:    Partners: Female   Other Topics Concern  . Not on file   Social History Narrative     Review of Systems, as per HPI, otherwise negative General:  No chills, fever, night sweats or weight changes.  Cardiovascular:  No chest pain, dyspnea on exertion, edema, orthopnea, palpitations, paroxysmal nocturnal dyspnea. Dermatological: No rash, lesions/masses Respiratory: No cough, dyspnea Urologic: No hematuria, dysuria Abdominal:   No nausea, vomiting, diarrhea, bright red blood per rectum, melena, or hematemesis Neurologic:  No visual changes, wkns, changes in mental status. All other systems reviewed and are otherwise negative except as noted above.  Physical Exam  Blood pressure 120/60, pulse 64, height _0  (1.651 m), weight 163 lb (73.936 kg), SpO2 98 %.  General: Pleasant, NAD Psych: Normal affect. Neuro: Alert and oriented X 3. Moves all extremities spontaneously. HEENT: Normal  Neck: Supple without bruits or JVD. Lungs:  Resp regular and unlabored, CTA. Heart: RRR no s3, s4, or murmurs. Abdomen: Soft, non-tender, non-distended, BS + x 4.  Extremities: No clubbing, cyanosis or edema. DP/PT/Radials 2+ and equal bilaterally.  Labs:  No results for input(s): CKTOTAL, CKMB, TROPONINI in the last 72 hours. Lab Results  Component Value Date   WBC 7.1 07/14/2009   HGB 16.0 07/31/2011   HCT 47.0 07/31/2011   MCV 91.2 07/14/2009   PLT 164 REPEATED TO VERIFY DELTA CHECK NOTED 07/14/2009    No results found for: DDIMER Invalid input(s): POCBNP    Component Value Date/Time   NA 139 01/11/2014 0853   NA 139 09/19/2013 1046   K 4.4 01/11/2014 0853   CL 99 01/11/2014 0853   CO2 25 01/11/2014 0853   GLUCOSE 102* 01/11/2014 0853   GLUCOSE 99 09/19/2013 1046   BUN 34* 01/11/2014 0853   BUN 25* 09/19/2013 1046   CREATININE 1.31* 01/11/2014 0853   CALCIUM 9.5  01/11/2014 0853   PROT 6.6 01/11/2014 0853   PROT 7.0 09/19/2013 1046   ALBUMIN 4.0 09/19/2013 1046   AST 13 01/11/2014 0853   ALT 12 01/11/2014 0853   ALKPHOS 62 01/11/2014 0853   BILITOT 0.6 01/11/2014 0853   GFRNONAA 50* 01/11/2014 0853   GFRAA 57* 01/11/2014 0853   Lab Results  Component Value Date   HDL 57 01/11/2014   LDLCALC 109* 01/11/2014   TRIG 86 01/11/2014    Accessory Clinical Findings  Echocardiogram - none  ECG - sinus bradycardia, 53 beats per minute, sinus pauses and frequent PACs.  Nuclear stress test: 08/31/2013 Impression Exercise Capacity: Lexiscan with low level exercise. BP Response: Normal blood pressure response. Clinical Symptoms: Chest pain ECG Impression: No significant ST segment change suggestive of ischemia. Comparison with Prior Nuclear Study: No images to compare  Overall Impression: This is a low/medium risk scan. There is scar affecting the entire inferolateral wall and the mid/apical anterolateral wall. There is peri-infarct ischemia at the base of the anterolateral wall.  LV Ejection Fraction: 63%. LV Wall Motion: Wall motion is good overall. There is decreased motion in the lateral wall.  Dola Argyle, MD    Assessment & Plan  80 year old male  1. Bradycardia with sinus pauses and frequent PACs. 24-hour Holter monitoring showed no significant pauses - the longest was 2.1 seconds. No signs of SSS, no a-fib found.   2. risk stratification - patient is very active for his age and fairly asymptomatic however significant risk factors and family history. A Lexiscan nuclear stress test showed scar in the inferolateral wall with mild peri-infarct ischemia. The patient is completely asymptomatic therefore we will continue aggressive medical management including statin, and we will add daily aspirin.  3. Hypertension - controlled  4. Hyperlipidemia - HDL 57, 3 of TAG 88 and LDL 109, continue the same dose of statin.  Followup in 6  months.   Dorothy Spark, MD, [facility] 03/01/2014, 9:24 AM

## 2014-03-20 DIAGNOSIS — M7061 Trochanteric bursitis, right hip: Secondary | ICD-10-CM | POA: Diagnosis not present

## 2014-04-01 ENCOUNTER — Other Ambulatory Visit: Payer: Self-pay | Admitting: Internal Medicine

## 2014-04-02 ENCOUNTER — Other Ambulatory Visit: Payer: Self-pay

## 2014-04-02 DIAGNOSIS — E039 Hypothyroidism, unspecified: Secondary | ICD-10-CM

## 2014-04-12 ENCOUNTER — Other Ambulatory Visit: Payer: Self-pay | Admitting: Internal Medicine

## 2014-04-16 ENCOUNTER — Other Ambulatory Visit: Payer: Self-pay | Admitting: Internal Medicine

## 2014-04-16 NOTE — Telephone Encounter (Signed)
Spoke with patient, Trazodone is not helping. Patient would like to switch back to temazepam. Patient said his pharmacy is going to price the temazepam at the same price as the trazodone.

## 2014-04-18 DIAGNOSIS — M7061 Trochanteric bursitis, right hip: Secondary | ICD-10-CM | POA: Diagnosis not present

## 2014-04-18 DIAGNOSIS — M25551 Pain in right hip: Secondary | ICD-10-CM | POA: Diagnosis not present

## 2014-05-13 ENCOUNTER — Other Ambulatory Visit: Payer: Self-pay | Admitting: Internal Medicine

## 2014-05-15 ENCOUNTER — Other Ambulatory Visit: Payer: Medicare Other

## 2014-05-15 DIAGNOSIS — I10 Essential (primary) hypertension: Secondary | ICD-10-CM

## 2014-05-15 DIAGNOSIS — E785 Hyperlipidemia, unspecified: Secondary | ICD-10-CM

## 2014-05-15 DIAGNOSIS — E1122 Type 2 diabetes mellitus with diabetic chronic kidney disease: Secondary | ICD-10-CM

## 2014-05-15 DIAGNOSIS — N183 Chronic kidney disease, stage 3 (moderate): Secondary | ICD-10-CM | POA: Diagnosis not present

## 2014-05-15 DIAGNOSIS — E039 Hypothyroidism, unspecified: Secondary | ICD-10-CM

## 2014-05-15 DIAGNOSIS — E1129 Type 2 diabetes mellitus with other diabetic kidney complication: Secondary | ICD-10-CM | POA: Diagnosis not present

## 2014-05-16 LAB — BASIC METABOLIC PANEL
BUN/Creatinine Ratio: 25 — ABNORMAL HIGH (ref 10–22)
BUN: 31 mg/dL — AB (ref 8–27)
CHLORIDE: 102 mmol/L (ref 97–108)
CO2: 24 mmol/L (ref 18–29)
CREATININE: 1.25 mg/dL (ref 0.76–1.27)
Calcium: 9.2 mg/dL (ref 8.6–10.2)
GFR calc non Af Amer: 53 mL/min/{1.73_m2} — ABNORMAL LOW (ref >59)
GFR, EST AFRICAN AMERICAN: 61 mL/min/{1.73_m2} (ref >59)
GLUCOSE: 88 mg/dL (ref 65–99)
Potassium: 3.9 mmol/L (ref 3.5–5.2)
Sodium: 140 mmol/L (ref 134–144)

## 2014-05-16 LAB — LIPID PANEL
Chol/HDL Ratio: 3 ratio units (ref 0.0–5.0)
Cholesterol, Total: 180 mg/dL (ref 100–199)
HDL: 60 mg/dL (ref >39)
LDL CALC: 105 mg/dL — AB (ref 0–99)
Triglycerides: 77 mg/dL (ref 0–149)
VLDL Cholesterol Cal: 15 mg/dL (ref 5–40)

## 2014-05-16 LAB — HEMOGLOBIN A1C
Est. average glucose Bld gHb Est-mCnc: 131 mg/dL
Hgb A1c MFr Bld: 6.2 % — ABNORMAL HIGH (ref 4.8–5.6)

## 2014-05-16 LAB — TSH: TSH: 3.32 u[IU]/mL (ref 0.450–4.500)

## 2014-05-16 LAB — MICROALBUMIN, URINE: Microalbumin, Urine: 25 ug/mL — ABNORMAL HIGH (ref 0.0–17.0)

## 2014-05-22 ENCOUNTER — Encounter: Payer: Self-pay | Admitting: Internal Medicine

## 2014-05-22 ENCOUNTER — Ambulatory Visit (INDEPENDENT_AMBULATORY_CARE_PROVIDER_SITE_OTHER): Payer: Medicare Other | Admitting: Internal Medicine

## 2014-05-22 VITALS — BP 134/72 | HR 53 | Temp 98.0°F | Ht 65.0 in | Wt 166.4 lb

## 2014-05-22 DIAGNOSIS — E785 Hyperlipidemia, unspecified: Secondary | ICD-10-CM | POA: Diagnosis not present

## 2014-05-22 DIAGNOSIS — R938 Abnormal findings on diagnostic imaging of other specified body structures: Secondary | ICD-10-CM | POA: Diagnosis not present

## 2014-05-22 DIAGNOSIS — I10 Essential (primary) hypertension: Secondary | ICD-10-CM | POA: Diagnosis not present

## 2014-05-22 DIAGNOSIS — M7071 Other bursitis of hip, right hip: Secondary | ICD-10-CM | POA: Diagnosis not present

## 2014-05-22 DIAGNOSIS — E1122 Type 2 diabetes mellitus with diabetic chronic kidney disease: Secondary | ICD-10-CM | POA: Diagnosis not present

## 2014-05-22 DIAGNOSIS — N183 Chronic kidney disease, stage 3 (moderate): Secondary | ICD-10-CM

## 2014-05-22 DIAGNOSIS — R9389 Abnormal findings on diagnostic imaging of other specified body structures: Secondary | ICD-10-CM

## 2014-05-22 DIAGNOSIS — E039 Hypothyroidism, unspecified: Secondary | ICD-10-CM

## 2014-05-23 ENCOUNTER — Other Ambulatory Visit: Payer: Self-pay | Admitting: Internal Medicine

## 2014-05-23 DIAGNOSIS — R9389 Abnormal findings on diagnostic imaging of other specified body structures: Secondary | ICD-10-CM

## 2014-05-23 DIAGNOSIS — M7071 Other bursitis of hip, right hip: Secondary | ICD-10-CM | POA: Insufficient documentation

## 2014-05-23 NOTE — Progress Notes (Signed)
Patient ID: Bobby Jensen, male   DOB: August 20, 1927, 81 y.o.   MRN: [id number]    Facility  [facility]    Place of Service:   OFFICE   Allergies  Allergen Reactions  . Fioricet [Butalbital-Apap-Caffeine]     Unaware of this allergy per patient.   . Other Other (See Comments)    Profen II DM= urinary outlet obstruction Unaware of this allergy per patient.  [name] [Guaifenesin]     Unaware of this allergy per patient.    Chief Complaint  Patient presents with  . Medical Management of Chronic Issues    4 Month Follow up    HPI:  Type II diabetes mellitus with stage 3 chronic kidney disease - controlled   Essential hypertension: Controlled  Hyperlipidemia - : Controlled  Hypothyroidism, unspecified hypothyroidism type: Compensated  Bursitis of right hip: Patient was seen by Dr. French Ana in October and November 2016. He received a corticosteroid injection for a possible right hip bursitis on 01/03/2014. This didn't seem to help except for a couple days. He underwent an MR of the lumbar spine 01/27/2014. This showed diffuse lumbar spine spondylosis and at L4-5 there is a moderate broad-based disc bulge with flattening of the ventral thecal sac. There appear to be moderate bilateral facet arthropathy with ligamentum flavum infolding resulting in moderate spinal stenosis and bilateral lateral recess stenosis. He was seen again by Dr. French Ana on 01/29/2014. Dr. Alvester Morin note from that date indicates that there was a note made of hyperintense renal masses with cysts on one of his x-rays. Patient was seen by Dr. Ron Agee for consultation about a possible injection in the spine to see if this would help his pain. His notes suggest that he would be willing to do that. Mr. Shiraishi was supposed to see him again in about 3 weeks after his December 8 visit, but this has not been done.  I spoke to Dr. French Ana about the abnormal renal findings on 05/23/2014. He will send me a copy of the pertinent  report.    Medications: Patient's Medications  New Prescriptions   No medications on file  Previous Medications   ALPRAZOLAM (XANAX) 0.25 MG TABLET    Take 1 tablet (0.25 mg total) by mouth 2 (two) times daily as needed for anxiety.   BLOOD GLUCOSE MONITORING SUPPL (ONE TOUCH ULTRA 2) W/DEVICE KIT    Check blood sugar once daily as directed DX 250.00   CHOLECALCIFEROL (VITAMIN D PO)    Take 1,000 Units by mouth daily.    GLIMEPIRIDE (AMARYL) 1 MG TABLET    TAKE 1 TABLET BY MOUTH EVERY DAY   GLUCOSE BLOOD (FREESTYLE LITE TEST VI)       GLUCOSE BLOOD (ONE TOUCH ULTRA TEST) TEST STRIP    Check blood sugar twice daily   LEVOTHYROXINE (SYNTHROID, LEVOTHROID) 50 MCG TABLET    TAKE 1 TABLET BY MOUTH ONCE DAILY FOR THYROID SUPPLEMENT.   LOSARTAN-HYDROCHLOROTHIAZIDE (HYZAAR) 100-25 MG PER TABLET    TAKE 1 TABLET BY MOUTH ONCE DAILY TO CONTROL BLOOD PRESSURE   ONETOUCH DELICA LANCETS 27C MISC    Check blood sugar once daily as directed DX 250.00   SIMVASTATIN (ZOCOR) 20 MG TABLET    Take 1 tablet by mouth daily.   TEMAZEPAM (RESTORIL) 15 MG CAPSULE    TAKE 1 CAPSULE BY MOUTH EVERY NIGHT AT BEDTIME AS NEEDED   TRIAMCINOLONE CREAM (KENALOG) 0.1 %    Apply topically 2 (two) times daily. Apply to rash as needed.  Modified Medications   No medications on file  Discontinued Medications   No medications on file     Review of Systems  Constitutional: Negative.  Negative for fever, fatigue and unexpected weight change.  HENT: Negative.   Eyes: Positive for visual disturbance (corrective lenses).  Respiratory: Negative for cough and shortness of breath.   Cardiovascular: Positive for chest pain (tightness). Negative for palpitations and leg swelling.  Gastrointestinal: Negative for diarrhea and constipation.  Endocrine:       Diabetic.  Genitourinary: Negative.   Musculoskeletal: Positive for arthralgias. Negative for myalgias, back pain and joint swelling.       Unsteady gait. Denies arthritis.  Chronic discomfort in the right hip area.  Skin: Negative.   Allergic/Immunologic: Negative.   Neurological: Positive for dizziness. Negative for weakness and headaches.       Some difficulty with balance. Has a slightly unsteady gait.  Hematological: Negative.   Psychiatric/Behavioral:       Chronic mild anxiety. Worries about his wife. Reports recent irritability    Filed Vitals:   05/22/14 1329  BP: 134/72  Pulse: 53  Temp: 98 F (36.7 C)  TempSrc: Oral  Height: 5' 5"  (1.651 m)  Weight: 166 lb 6.4 oz (75.479 kg)   Body mass index is 27.69 kg/(m^2).  Physical Exam  Constitutional: He is oriented to person, place, and time. He appears well-developed and well-nourished. No distress.  Mildly overweight.  HENT:  Head: Normocephalic and atraumatic.  Right Ear: External ear normal.  Left Ear: External ear normal.  Nose: Nose normal.  Mouth/Throat: Oropharynx is clear and moist.  Eyes: Conjunctivae and EOM are normal. Pupils are equal, round, and reactive to light.  Wears corrective lenses. Reduced visual acuity bilaterally.  Neck: Neck supple. No JVD present. No tracheal deviation present. No thyromegaly present.  Cardiovascular: Regular rhythm, normal heart sounds and intact distal pulses.  Bradycardia present.  Exam reveals no gallop and no friction rub.   No murmur heard. Pulmonary/Chest: Breath sounds normal. No respiratory distress. He has no wheezes. He has no rales. He exhibits no tenderness.  Abdominal: Bowel sounds are normal. He exhibits no distension and no mass. There is no tenderness.  Musculoskeletal: Normal range of motion. He exhibits no edema.  Tenderness right hip. Mild gait instability.  Lymphadenopathy:    He has no cervical adenopathy.  Neurological: He is alert and oriented to person, place, and time. No cranial nerve deficit. Coordination normal.  Normal response to vibratory testing.  Skin: No rash noted. No erythema. No pallor.  Psychiatric: He has a  normal mood and affect. His behavior is normal. Judgment and thought content normal.     Labs reviewed: Appointment on 05/15/2014  Component Date Value Ref Range Status  . Hgb A1c MFr Bld 05/15/2014 6.2* 4.8 - 5.6 % Final   Comment:          Pre-diabetes: 5.7 - 6.4          Diabetes: >6.4          Glycemic control for adults with diabetes: <7.0   . Est. average glucose Bld gHb Est-m* 05/15/2014 131   Final  . Glucose 05/15/2014 88  65 - 99 mg/dL Final  . BUN 05/15/2014 31* 8 - 27 mg/dL Final  . Creatinine, Ser 05/15/2014 1.25  0.76 - 1.27 mg/dL Final  . GFR calc non Af Amer 05/15/2014 53* >59 mL/min/1.73 Final  . GFR calc Af Amer 05/15/2014 61  >59 mL/min/1.73 Final  .  BUN/Creatinine Ratio 05/15/2014 25* 10 - 22 Final  . Sodium 05/15/2014 140  134 - 144 mmol/L Final  . Potassium 05/15/2014 3.9  3.5 - 5.2 mmol/L Final  . Chloride 05/15/2014 102  97 - 108 mmol/L Final  . CO2 05/15/2014 24  18 - 29 mmol/L Final  . Calcium 05/15/2014 9.2  8.6 - 10.2 mg/dL Final  . Cholesterol, Total 05/15/2014 180  100 - 199 mg/dL Final  . Triglycerides 05/15/2014 77  0 - 149 mg/dL Final  . HDL 05/15/2014 60  >39 mg/dL Final   Comment: According to ATP-III Guidelines, HDL-C >59 mg/dL is considered a negative risk factor for CHD.   Marland Kitchen VLDL Cholesterol Cal 05/15/2014 15  5 - 40 mg/dL Final  . LDL Calculated 05/15/2014 105* 0 - 99 mg/dL Final  . Chol/HDL Ratio 05/15/2014 3.0  0.0 - 5.0 ratio units Final   Comment:                                   T. Chol/HDL Ratio                                             Men  Women                               1/2 Avg.Risk  3.4    3.3                                   Avg.Risk  5.0    4.4                                2X Avg.Risk  9.6    7.1                                3X Avg.Risk 23.4   11.0   . Microalbum.,U,Random 05/15/2014 25.0* 0.0 - 17.0 ug/mL Final  . TSH 05/15/2014 3.320  0.450 - 4.500 uIU/mL Final     Assessment/Plan 1. Type II diabetes  mellitus with stage 3 chronic kidney disease Controlled - Hemoglobin A1c; Future - Basic metabolic panel; Future  2. Essential hypertension 12  3. Hyperlipidemia Controlled - Lipid panel; Future  4. Hypothyroidism, unspecified hypothyroidism type Compensated  5. Bursitis of right hip Continues with pain. I advised to continue to see the orthopedist  6. Abnormal x-ray Schedule ultrasound of the kidneys

## 2014-05-29 ENCOUNTER — Ambulatory Visit
Admission: RE | Admit: 2014-05-29 | Discharge: 2014-05-29 | Disposition: A | Discharge status: Home / Self Care | Payer: Medicare Other | Source: Ambulatory Visit | Attending: Internal Medicine | Admitting: Internal Medicine

## 2014-05-29 DIAGNOSIS — N281 Cyst of kidney, acquired: Secondary | ICD-10-CM | POA: Diagnosis not present

## 2014-05-29 DIAGNOSIS — R9389 Abnormal findings on diagnostic imaging of other specified body structures: Secondary | ICD-10-CM

## 2014-07-03 ENCOUNTER — Other Ambulatory Visit: Payer: Self-pay | Admitting: Internal Medicine

## 2014-07-08 ENCOUNTER — Other Ambulatory Visit: Payer: Self-pay | Admitting: Internal Medicine

## 2014-07-16 ENCOUNTER — Other Ambulatory Visit: Payer: Self-pay | Admitting: Internal Medicine

## 2014-08-15 ENCOUNTER — Other Ambulatory Visit: Payer: Self-pay | Admitting: Internal Medicine

## 2014-08-15 DIAGNOSIS — E119 Type 2 diabetes mellitus without complications: Secondary | ICD-10-CM | POA: Diagnosis not present

## 2014-08-16 ENCOUNTER — Other Ambulatory Visit: Payer: Self-pay | Admitting: Internal Medicine

## 2014-09-11 ENCOUNTER — Other Ambulatory Visit: Payer: Medicare Other

## 2014-09-11 DIAGNOSIS — E785 Hyperlipidemia, unspecified: Secondary | ICD-10-CM

## 2014-09-11 DIAGNOSIS — N183 Chronic kidney disease, stage 3 (moderate): Secondary | ICD-10-CM | POA: Diagnosis not present

## 2014-09-11 DIAGNOSIS — E1122 Type 2 diabetes mellitus with diabetic chronic kidney disease: Secondary | ICD-10-CM

## 2014-09-12 LAB — BASIC METABOLIC PANEL
BUN/Creatinine Ratio: 23 — ABNORMAL HIGH (ref 10–22)
BUN: 25 mg/dL (ref 8–27)
CALCIUM: 9.2 mg/dL (ref 8.6–10.2)
CO2: 24 mmol/L (ref 18–29)
Chloride: 103 mmol/L (ref 97–108)
Creatinine, Ser: 1.11 mg/dL (ref 0.76–1.27)
GFR, EST AFRICAN AMERICAN: 70 mL/min/{1.73_m2} (ref >59)
GFR, EST NON AFRICAN AMERICAN: 61 mL/min/{1.73_m2} (ref >59)
Glucose: 90 mg/dL (ref 65–99)
Potassium: 4.5 mmol/L (ref 3.5–5.2)
SODIUM: 141 mmol/L (ref 134–144)

## 2014-09-12 LAB — LIPID PANEL
CHOLESTEROL TOTAL: 147 mg/dL (ref 100–199)
Chol/HDL Ratio: 3.1 ratio units (ref 0.0–5.0)
HDL: 48 mg/dL (ref >39)
LDL Calculated: 80 mg/dL (ref 0–99)
Triglycerides: 95 mg/dL (ref 0–149)
VLDL Cholesterol Cal: 19 mg/dL (ref 5–40)

## 2014-09-12 LAB — HEMOGLOBIN A1C
ESTIMATED AVERAGE GLUCOSE: 131 mg/dL
HEMOGLOBIN A1C: 6.2 % — AB (ref 4.8–5.6)

## 2014-09-18 ENCOUNTER — Ambulatory Visit (INDEPENDENT_AMBULATORY_CARE_PROVIDER_SITE_OTHER): Payer: Medicare Other | Admitting: Internal Medicine

## 2014-09-18 ENCOUNTER — Encounter: Payer: Self-pay | Admitting: Internal Medicine

## 2014-09-18 VITALS — BP 110/60 | HR 52 | Temp 98.1°F | Resp 18 | Ht 65.0 in | Wt 169.8 lb

## 2014-09-18 DIAGNOSIS — E039 Hypothyroidism, unspecified: Secondary | ICD-10-CM

## 2014-09-18 DIAGNOSIS — N183 Chronic kidney disease, stage 3 unspecified: Secondary | ICD-10-CM

## 2014-09-18 DIAGNOSIS — I10 Essential (primary) hypertension: Secondary | ICD-10-CM | POA: Diagnosis not present

## 2014-09-18 DIAGNOSIS — E1122 Type 2 diabetes mellitus with diabetic chronic kidney disease: Secondary | ICD-10-CM

## 2014-09-18 DIAGNOSIS — M7071 Other bursitis of hip, right hip: Secondary | ICD-10-CM | POA: Diagnosis not present

## 2014-09-18 DIAGNOSIS — E785 Hyperlipidemia, unspecified: Secondary | ICD-10-CM

## 2014-09-18 NOTE — Progress Notes (Signed)
Patient ID: Bobby Jensen, male   DOB: January 11, 1928, 81 y.o.   MRN: [id number]    Facility  [facility]    Place of Service:   OFFICE    Allergies  Allergen Reactions  . Fioricet [Butalbital-Apap-Caffeine]     Unaware of this allergy per patient.   . Other Other (See Comments)    Profen II DM= urinary outlet obstruction Unaware of this allergy per patient.  [name] [Guaifenesin]     Unaware of this allergy per patient.    Chief Complaint  Patient presents with  . Follow-up    HPI:  Essential hypertension: Has occasionally felt lightheaded when bending over while golfing, resolves quickly with standing upright. BP well controlled at 112/60 today.   Hyperlipidemia: Well controlled  Type II diabetes mellitus with stage 3 chronic kidney disease: Well controlled, no hypoglycemic episodes  Hypothyroidism, unspecified hypothyroidism type: Controlled, 3.32 on 05/15/14  Bursitis rightt hip: ongoing pain to left hip, s/p replacement. Has seen several different orthopedist's. Cortisone injections provided minimal relief for just 2 days. Able to continue activities as desires, still golfing. Controls pain with 600 mg Ibu on days that he golfs or his more active.  Medications: Patient's Medications  New Prescriptions   No medications on file  Previous Medications   ALPRAZOLAM (XANAX) 0.25 MG TABLET    Take 1 tablet (0.25 mg total) by mouth 2 (two) times daily as needed for anxiety.   BLOOD GLUCOSE MONITORING SUPPL (ONE TOUCH ULTRA 2) W/DEVICE KIT    Check blood sugar once daily as directed DX 250.00   CHOLECALCIFEROL (VITAMIN D PO)    Take 1,000 Units by mouth daily.    GLIMEPIRIDE (AMARYL) 1 MG TABLET    TAKE 1 TABLET BY MOUTH EVERY DAY   GLUCOSE BLOOD (FREESTYLE LITE TEST VI)       GLUCOSE BLOOD (ONE TOUCH ULTRA TEST) TEST STRIP    Check blood sugar twice daily   LEVOTHYROXINE (SYNTHROID, LEVOTHROID) 50 MCG TABLET    TAKE 1 TABLET BY MOUTH ONCE DAILY FOR THYROID SUPPLEMENT   LOSARTAN-HYDROCHLOROTHIAZIDE (HYZAAR) 100-25 MG PER TABLET    TAKE 1 TABLET BY MOUTH ONCE DAILY TO CONTROL BLOOD PRESSURE   ONETOUCH DELICA LANCETS 52W MISC    Check blood sugar once daily as directed DX 250.00   SIMVASTATIN (ZOCOR) 20 MG TABLET    TAKE 1 TABLET BY MOUTH EVERY NIGHT AT BEDTIME   TEMAZEPAM (RESTORIL) 15 MG CAPSULE    TAKE 1 CAPSULE AT BEDTIME AS NEEDED FOR SLEEP.   TRIAMCINOLONE CREAM (KENALOG) 0.1 %    Apply topically 2 (two) times daily. Apply to rash as needed.  Modified Medications   No medications on file  Discontinued Medications   SIMVASTATIN (ZOCOR) 20 MG TABLET    Take 1 tablet by mouth daily.     Review of Systems  Constitutional: Negative.  Negative for fever, fatigue and unexpected weight change.  HENT: Negative.   Eyes: Positive for visual disturbance (corrective lenses).  Respiratory: Negative for cough and shortness of breath.   Cardiovascular: Positive for chest pain (tightness). Negative for palpitations and leg swelling.  Gastrointestinal: Negative for diarrhea and constipation.  Endocrine:       Diabetic.  Genitourinary: Negative.   Musculoskeletal: Positive for arthralgias. Negative for myalgias, back pain and joint swelling.       Unsteady gait. Denies arthritis. Chronic discomfort in the right hip area.  Skin: Negative.   Allergic/Immunologic: Negative.   Neurological: Positive for dizziness.  Negative for weakness and headaches.       Some difficulty with balance. Has a slightly unsteady gait.  Hematological: Negative.   Psychiatric/Behavioral:       Chronic mild anxiety. Worries about his wife. Reports recent irritability    Filed Vitals:   09/18/14 1323  BP: 110/60  Pulse: 52  Temp: 98.1 F (36.7 C)  TempSrc: Oral  Resp: 18  Height: _0  (1.651 m)  Weight: 169 lb 12.8 oz (77.021 kg)  SpO2: 95%   Body mass index is 28.26 kg/(m^2).  Physical Exam  Constitutional: He is oriented to person, place, and time. He appears well-developed  and well-nourished. No distress.  Mildly overweight.  HENT:  Head: Normocephalic and atraumatic.  Right Ear: External ear normal.  Left Ear: External ear normal.  Nose: Nose normal.  Mouth/Throat: Oropharynx is clear and moist.  Eyes: Conjunctivae and EOM are normal. Pupils are equal, round, and reactive to light.  Wears corrective lenses. Reduced visual acuity bilaterally.  Neck: Neck supple. No JVD present. No tracheal deviation present. No thyromegaly present.  Cardiovascular: Regular rhythm, normal heart sounds and intact distal pulses.  Exam reveals no gallop and no friction rub.   No murmur heard. Pulmonary/Chest: Breath sounds normal. No respiratory distress. He has no wheezes. He has no rales. He exhibits no tenderness.  Abdominal: Bowel sounds are normal. He exhibits no distension and no mass. There is no tenderness.  Musculoskeletal: Normal range of motion. He exhibits no edema.  Tenderness right hip. Mild gait instability.  Lymphadenopathy:    He has no cervical adenopathy.  Neurological: He is alert and oriented to person, place, and time. No cranial nerve deficit. Coordination normal.  Normal response to vibratory testing.  Skin: No rash noted. No erythema. No pallor.  Psychiatric: He has a normal mood and affect. His behavior is normal. Judgment and thought content normal.     Labs reviewed: Appointment on 09/11/2014  Component Date Value Ref Range Status  . Hgb A1c MFr Bld 09/11/2014 6.2* 4.8 - 5.6 % Final   Comment:          Pre-diabetes: 5.7 - 6.4          Diabetes: >6.4          Glycemic control for adults with diabetes: <7.0   . Est. average glucose Bld gHb Est-m* 09/11/2014 131   Final  . Glucose 09/11/2014 90  65 - 99 mg/dL Final  . BUN 09/11/2014 25  8 - 27 mg/dL Final  . Creatinine, Ser 09/11/2014 1.11  0.76 - 1.27 mg/dL Final  . GFR calc non Af Amer 09/11/2014 61  >59 mL/min/1.73 Final  . GFR calc Af Amer 09/11/2014 70  >59 mL/min/1.73 Final  .  BUN/Creatinine Ratio 09/11/2014 23* 10 - 22 Final  . Sodium 09/11/2014 141  134 - 144 mmol/L Final  . Potassium 09/11/2014 4.5  3.5 - 5.2 mmol/L Final  . Chloride 09/11/2014 103  97 - 108 mmol/L Final  . CO2 09/11/2014 24  18 - 29 mmol/L Final  . Calcium 09/11/2014 9.2  8.6 - 10.2 mg/dL Final  . Cholesterol, Total 09/11/2014 147  100 - 199 mg/dL Final  . Triglycerides 09/11/2014 95  0 - 149 mg/dL Final  . HDL 09/11/2014 48  >39 mg/dL Final   Comment: According to ATP-III Guidelines, HDL-C >59 mg/dL is considered a negative risk factor for CHD.   Marland Kitchen VLDL Cholesterol Cal 09/11/2014 19  5 - 40 mg/dL Final  .  LDL Calculated 09/11/2014 80  0 - 99 mg/dL Final  . Chol/HDL Ratio 09/11/2014 3.1  0.0 - 5.0 ratio units Final   Comment:                                   T. Chol/HDL Ratio                                             Men  Women                               1/2 Avg.Risk  3.4    3.3                                   Avg.Risk  5.0    4.4                                2X Avg.Risk  9.6    7.1                                3X Avg.Risk 23.4   11.0      Assessment/Plan 1. Essential hypertension Well controlled Reduce Hyzarr to  50-12.5, may cut tabs in half - Basic metabolic panel; Future  2. Hyperlipidemia Well controlled - Lipid panel; Future  3. Type II diabetes mellitus with stage 3 chronic kidney disease Well controlled - Hemoglobin A1c; Future - Basic metabolic panel; Future  4. Hypothyroidism, unspecified hypothyroidism type - TSH; Future  5. Bursitis right Hip Continue Ibuprofen as needed

## 2014-09-18 NOTE — Patient Instructions (Signed)
Reduce losartan/ HCTZ to 1/2 tablet daily.

## 2014-10-05 ENCOUNTER — Other Ambulatory Visit: Payer: Self-pay | Admitting: Nurse Practitioner

## 2014-10-07 ENCOUNTER — Other Ambulatory Visit: Payer: Self-pay | Admitting: *Deleted

## 2014-10-07 MED ORDER — TRIAMCINOLONE ACETONIDE 0.1 % EX CREA: TOPICAL_CREAM | Freq: Two times a day (BID) | CUTANEOUS | Status: DC

## 2014-10-07 NOTE — Telephone Encounter (Signed)
[facility] [place] 

## 2014-10-14 ENCOUNTER — Other Ambulatory Visit: Payer: Self-pay | Admitting: Internal Medicine

## 2014-10-23 ENCOUNTER — Other Ambulatory Visit: Payer: Self-pay

## 2014-10-23 ENCOUNTER — Other Ambulatory Visit: Payer: Self-pay | Admitting: Internal Medicine

## 2014-10-23 MED ORDER — LEVOTHYROXINE SODIUM 50 MCG PO TABS: ORAL_TABLET | ORAL | Status: DC

## 2014-10-23 NOTE — Telephone Encounter (Signed)
Message was received via fax indicating rx for synthroid did not go through. I called rx in

## 2014-11-01 ENCOUNTER — Encounter: Payer: Self-pay | Admitting: Cardiology

## 2014-11-01 ENCOUNTER — Ambulatory Visit (INDEPENDENT_AMBULATORY_CARE_PROVIDER_SITE_OTHER): Payer: Medicare Other | Admitting: Cardiology

## 2014-11-01 VITALS — BP 126/62 | HR 55 | Ht 65.0 in | Wt 170.0 lb

## 2014-11-01 DIAGNOSIS — E785 Hyperlipidemia, unspecified: Secondary | ICD-10-CM

## 2014-11-01 DIAGNOSIS — R42 Dizziness and giddiness: Secondary | ICD-10-CM | POA: Diagnosis not present

## 2014-11-01 DIAGNOSIS — R001 Bradycardia, unspecified: Secondary | ICD-10-CM

## 2014-11-01 DIAGNOSIS — I10 Essential (primary) hypertension: Secondary | ICD-10-CM

## 2014-11-01 DIAGNOSIS — I2510 Atherosclerotic heart disease of native coronary artery without angina pectoris: Secondary | ICD-10-CM | POA: Diagnosis not present

## 2014-11-01 DIAGNOSIS — I2583 Coronary atherosclerosis due to lipid rich plaque: Secondary | ICD-10-CM

## 2014-11-01 MED ORDER — NITROGLYCERIN 0.4 MG SL SUBL: 0.4000 mg | SUBLINGUAL_TABLET | SUBLINGUAL | Status: DC | PRN

## 2014-11-01 NOTE — Patient Instructions (Signed)
Medication Instructions:   DR Meda Coffee HAS PRESCRIBED SUBLINGUAL (UNDER THE TONGUE) NITROGLYCERIN 0.4MG  TO TAKE ONLY AS NEEDED FOR CHEST PAIN---PLEASE FOLLOW THE INSTRUCTIONS ON THE BOTTLE VERY CAREFULLY.     Testing/Procedures:  Your physician has requested that you have a carotid duplex. This test is an ultrasound of the carotid arteries in your neck. It looks at blood flow through these arteries that supply the brain with blood. Allow one hour for this exam. There are no restrictions or special instructions.    Follow-Up:  Your physician wants you to follow-up in: [place] will receive a reminder letter in the mail two months in advance. If you don't receive a letter, please call our office to schedule the follow-up appointment.

## 2014-11-01 NOTE — Progress Notes (Signed)
Patient ID: Bobby Jensen, male   DOB: 02/03/1928, 81 y.o.   MRN: [id number]    Patient Name: Bobby Jensen Date of Encounter: 11/01/2014  Primary Care Provider:  Estill Dooms, MD Primary Cardiologist: Bobby Jensen  Problem List   Past Medical History  Diagnosis Date  . Hypertension 07/08/1998  . Type II or unspecified type diabetes mellitus without mention of complication, not stated as uncontrolled 07/15/2006  . Kidney calculi 08/20/2009  . Unspecified constipation 08/05/2010  . Unspecified hypothyroidism 12/17/2009  . Pain in joint, pelvic region and thigh 12/17/2009  . Rosacea 08/20/2009  . Inguinal hernia without mention of obstruction or gangrene, unilateral or unspecified, (not specified as recurrent) 11/20/2008  . Bladder neck obstruction 03/28/2008  . Other and unspecified hyperlipidemia 12/23/2004  . Other malaise and fatigue 12/23/2004  . Pain in joint, shoulder region 11/01/2003  . Insomnia, unspecified 11/01/2003  . Peptic ulcer, unspecified site, unspecified as acute or chronic, without mention of hemorrhage, perforation, or obstruction 06/25/2003  . 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 Elevated prostate specific antigen (PSA) 03/15/1998  . Gastric ulcer, unspecified as acute or chronic, without mention of hemorrhage, perforation, or obstruction 03/15/1993  . Hypertrophy of prostate without urinary obstruction and other lower urinary tract symptoms (LUTS) 03/15/1989  . Contact with or exposure to tuberculosis 03/15/1992  . Personal history of malaria 03/16/1939   Past Surgical History  Procedure Laterality Date  . Prostate surgery    . Total hip arthroplasty Right 07/10/2009    Dr. Telford Nab  . Appendectomy  1956  . Pilonidal cyst excision  1963  . Hemorroidectomy  06/2002    Dr. Lennie Hummer  . Hernia repair Bilateral 11/2008    inguinal   Allergies  Allergies  Allergen Reactions  . Fioricet [Butalbital-Apap-Caffeine]     Unaware of  this allergy per patient.   . Other Other (See Comments)    Profen II DM= urinary outlet obstruction Unaware of this allergy per patient.  [name] [Guaifenesin]     Unaware of this allergy per patient.   HPI  A very pleasant younger appearing 81 year old gentleman who is originally from [place] who was referred to [place] by his primary care physician for concern of bradycardia and risk stratification for coronary artery disease. The patient has a history of known insulin-dependent diabetes mellitus, hypertension and hyperlipidemia. He has family history of premature coronary artery disease, his father died of myocardial infarction in his 68s. His mother lived to age of 4 his 28 year old sister still alive. The patient is very active physically and intellectually. He has worked until he was 33 traveling around the [place]. He is a Social research officer, government. Golf on a regular basis and other than he is hip replacement he has no limitations in regards to chest pain, shortness of breath, palpitations or syncope. He occasionally feels dizzy but has never experienced syncopal episode. He is very compliant with his medications.  03/01/2014 - the patient is coming after 6 months. Meanwhile he was evaluated for possible sick sinus syndrome that was ruled out on the 24-hour Holter monitor. It only showed 2.1 second part at night otherwise no significant bradycardia and no other arrhythmia such as atrial fibrillation were identified. The patient states that he continues being very active playing called and denies any chest pain or shortness of breath. His most limiting factor right now is his hip pain for which he is being evaluated. He stays active helping at home on for example  yesterday he vacuumed three-story house and he denied any symptoms during that. He is very compliant with his medicines. On at the last visit we perform a stress test that showed a scar in the inferolateral wall with mild peri-infarct ischemia,  however considering leg of symptoms we intensified medical therapy and patient continues being asymptomatic.  11/01/14 - 10 months follow up, he feels great, occasional chest pain when emotionally upset, plays golf twice a week, no CP, stable Dyspnea on moderate exertion. Frequent dizziness, no presyncope or syncope, his losartan-hctz was recently decreased from 100-25 to 50-12.5, he feels slightly better. Complaint with his meds.   Home Medications  Prior to Admission medications   Medication Sig Start Date End Date Taking? Authorizing Provider  Blood Glucose Monitoring Suppl (ONE TOUCH ULTRA 2) W/DEVICE KIT Check blood sugar once daily as directed DX 250.00 03/06/13  Yes Bobby Dooms, MD  Cholecalciferol (VITAMIN D PO) Take 1,000 Units by mouth daily.    Yes Historical Provider, MD  glimepiride (AMARYL) 1 MG tablet Take one tablet by mouth once daily to control blood sugar 08/08/13  Yes Bobby Dooms, MD  Glucose Blood (FREESTYLE LITE TEST VI)  02/24/13  Yes Historical Provider, MD  glucose blood (ONE TOUCH ULTRA TEST) test strip Check blood sugar twice daily 07/11/13  Yes Bobby Dooms, MD  levothyroxine (SYNTHROID, LEVOTHROID) 50 MCG tablet Take one tablet once a day for thyroid supplement 09/06/12  Yes Bobby Dooms, MD  losartan-hydrochlorothiazide [facility]) 100-25 MG per tablet Take one tablet by mouth once daily to control blood pressure 04/30/13  Yes Bobby L Reed, DO  ONETOUCH DELICA LANCETS 56L MISC Check blood sugar once daily as directed DX 250.00 03/06/13  Yes Bobby Dooms, MD  simvastatin (ZOCOR) 40 MG tablet TAKE 1/2 TABLET BY MOUTH AT BEDTIME TO LOWER CHOLESTEROL   Yes Bobby Dooms, MD  temazepam (RESTORIL) 15 MG capsule TAKE 1 CAPSULE BY MOUTH AT BEDTIME AS NEEDED FOR SLEEP 07/11/13  Yes Bobby Dooms, MD  triamcinolone cream (KENALOG) 0.1 % Apply topically 2 (two) times daily. Apply to rash as needed. 09/06/12  Yes Bobby Dooms, MD    Family History  Family History    Problem Relation Age of Onset  . Heart disease Father     Social History  Social History   Social History  . Marital Status: Married    Spouse Name: N/A  . Number of Children: N/A  . Years of Education: N/A   Occupational History  . Not on file.   Social History Main Topics  . Smoking status: Former Smoker    Quit date: 07/31/1978  . Smokeless tobacco: Not on file  . Alcohol Use: Yes  . Drug Use: No  . Sexual Activity:    Partners: Female   Other Topics Concern  . Not on file   Social History Narrative     Review of Systems, as per HPI, otherwise negative General:  No chills, fever, night sweats or weight changes.  Cardiovascular:  No chest pain, dyspnea on exertion, edema, orthopnea, palpitations, paroxysmal nocturnal dyspnea. Dermatological: No rash, lesions/masses Respiratory: No cough, dyspnea Urologic: No hematuria, dysuria Abdominal:   No nausea, vomiting, diarrhea, bright red blood per rectum, melena, or hematemesis Neurologic:  No visual changes, wkns, changes in mental status. All other systems reviewed and are otherwise negative except as noted above.  Physical Exam  Blood pressure 126/62, pulse 55, height _0  (1.651 m), weight 170 lb (  77.111 kg).  General: Pleasant, NAD Psych: Normal affect. Neuro: Alert and oriented X 3. Moves all extremities spontaneously. HEENT: Normal  Neck: Supple without bruits or JVD. Lungs:  Resp regular and unlabored, CTA. Heart: RRR no s3, s4, or murmurs. Abdomen: Soft, non-tender, non-distended, BS + x 4.  Extremities: No clubbing, cyanosis or edema. DP/PT/Radials 2+ and equal bilaterally.  Labs:  No results for input(s): CKTOTAL, CKMB, TROPONINI in the last 72 hours. Lab Results  Component Value Date   WBC 7.1 07/14/2009   HGB 16.0 07/31/2011   HCT 47.0 07/31/2011   MCV 91.2 07/14/2009   PLT 164 REPEATED TO VERIFY DELTA CHECK NOTED 07/14/2009    No results found for: DDIMER Invalid input(s): POCBNP     Component Value Date/Time   NA 141 09/11/2014 0813   NA 139 09/19/2013 1046   K 4.5 09/11/2014 0813   CL 103 09/11/2014 0813   CO2 24 09/11/2014 0813   GLUCOSE 90 09/11/2014 0813   GLUCOSE 99 09/19/2013 1046   BUN 25 09/11/2014 0813   BUN 25* 09/19/2013 1046   CREATININE 1.11 09/11/2014 0813   CALCIUM 9.2 09/11/2014 0813   PROT 6.6 01/11/2014 0853   PROT 7.0 09/19/2013 1046   ALBUMIN 4.0 09/19/2013 1046   AST 13 01/11/2014 0853   ALT 12 01/11/2014 0853   ALKPHOS 62 01/11/2014 0853   BILITOT 0.6 01/11/2014 0853   GFRNONAA 61 09/11/2014 0813   GFRAA 70 09/11/2014 0813   Lab Results  Component Value Date   CHOL 147 09/11/2014   HDL 48 09/11/2014   LDLCALC 80 09/11/2014   TRIG 95 09/11/2014    Accessory Clinical Findings  Echocardiogram - none  ECG - sinus bradycardia, 53 beats per minute, sinus pauses and frequent PACs.  Nuclear stress test: 08/31/2013 Impression Exercise Capacity: Lexiscan with low level exercise. BP Response: Normal blood pressure response. Clinical Symptoms: Chest pain ECG Impression: No significant ST segment change suggestive of ischemia. Comparison with Prior Nuclear Study: No images to compare  Overall Impression: This is a low/medium risk scan. There is scar affecting the entire inferolateral wall and the mid/apical anterolateral wall. There is peri-infarct ischemia at the base of the anterolateral wall.  LV Ejection Fraction: 63%. LV Wall Motion: Wall motion is good overall. There is decreased motion in the lateral wall.  Dola Argyle, MD    Assessment & Plan  81 year old male  1. Bradycardia with sinus pauses and frequent PACs. 24-hour Holter monitoring showed no significant pauses - the longest was 2.1 seconds. No signs of SSS, no a-fib found.  He has stable dizziness, no falls or syncope, no indication for a pacer right now.  2. CAD - patient is very active for his age and fairly asymptomatic however significant risk factors  and family history. A Lexiscan nuclear stress test showed scar in the inferolateral wall with mild peri-infarct ischemia. The patient is completely asymptomatic therefore we will continue aggressive medical management including statin, and we will add daily aspirin. Prescribe NTG PRN.  3. Hypertension - controlled  4. Hyperlipidemia - HDL 57, 3 of TAG 88 and LDL 109, continue the same dose of statin.  5. Dizziness - check B/L carotid US.  Followup in 1 year.    Bobby Spark, MD, [facility] 11/01/2014, 9:26 AM

## 2014-11-11 ENCOUNTER — Ambulatory Visit (HOSPITAL_COMMUNITY)
Admission: RE | Admit: 2014-11-11 | Discharge: 2014-11-11 | Disposition: A | Discharge status: Home / Self Care | Payer: Medicare Other | Source: Ambulatory Visit | Attending: Cardiovascular Disease | Admitting: Cardiovascular Disease

## 2014-11-11 DIAGNOSIS — E785 Hyperlipidemia, unspecified: Secondary | ICD-10-CM | POA: Diagnosis not present

## 2014-11-11 DIAGNOSIS — R001 Bradycardia, unspecified: Secondary | ICD-10-CM

## 2014-11-11 DIAGNOSIS — I6523 Occlusion and stenosis of bilateral carotid arteries: Secondary | ICD-10-CM | POA: Diagnosis not present

## 2014-11-11 DIAGNOSIS — R42 Dizziness and giddiness: Secondary | ICD-10-CM | POA: Diagnosis not present

## 2014-11-12 ENCOUNTER — Telehealth: Payer: Self-pay | Admitting: Cardiology

## 2014-11-12 NOTE — Telephone Encounter (Signed)
Notified the pt that per Dr Meda Coffee his carotid duplex showed very minimal carotid plaque, which is normal for his age.  Pt verbalized understanding.

## 2014-11-12 NOTE — Telephone Encounter (Signed)
New message ° ° ° ° ° °Returning a nurses call to get test results °

## 2015-01-09 ENCOUNTER — Other Ambulatory Visit: Payer: Self-pay

## 2015-01-09 ENCOUNTER — Other Ambulatory Visit: Payer: Self-pay | Admitting: Internal Medicine

## 2015-01-09 MED ORDER — TEMAZEPAM 15 MG PO CAPS: ORAL_CAPSULE | ORAL | Status: DC

## 2015-01-23 ENCOUNTER — Other Ambulatory Visit: Payer: Self-pay | Admitting: *Deleted

## 2015-01-23 MED ORDER — LOSARTAN POTASSIUM-HCTZ 100-25 MG PO TABS: ORAL_TABLET | ORAL | Status: DC

## 2015-01-23 MED ORDER — ALPRAZOLAM 0.25 MG PO TABS: ORAL_TABLET | ORAL | Status: DC

## 2015-01-23 NOTE — Telephone Encounter (Signed)
[facility] 

## 2015-02-13 ENCOUNTER — Encounter: Payer: Self-pay | Admitting: Internal Medicine

## 2015-03-20 ENCOUNTER — Other Ambulatory Visit: Payer: Self-pay | Admitting: Internal Medicine

## 2015-03-21 ENCOUNTER — Other Ambulatory Visit: Payer: Medicare Other

## 2015-03-25 ENCOUNTER — Ambulatory Visit: Payer: Medicare Other | Admitting: Internal Medicine

## 2015-03-28 ENCOUNTER — Other Ambulatory Visit: Payer: Medicare Other

## 2015-03-28 DIAGNOSIS — E1122 Type 2 diabetes mellitus with diabetic chronic kidney disease: Secondary | ICD-10-CM | POA: Diagnosis not present

## 2015-03-28 DIAGNOSIS — E785 Hyperlipidemia, unspecified: Secondary | ICD-10-CM

## 2015-03-28 DIAGNOSIS — I10 Essential (primary) hypertension: Secondary | ICD-10-CM

## 2015-03-28 DIAGNOSIS — E039 Hypothyroidism, unspecified: Secondary | ICD-10-CM

## 2015-03-28 DIAGNOSIS — N183 Chronic kidney disease, stage 3 unspecified: Secondary | ICD-10-CM

## 2015-03-29 LAB — BASIC METABOLIC PANEL
BUN / CREAT RATIO: 17 (ref 10–22)
BUN: 20 mg/dL (ref 8–27)
CHLORIDE: 103 mmol/L (ref 96–106)
CO2: 22 mmol/L (ref 18–29)
Calcium: 9 mg/dL (ref 8.6–10.2)
Creatinine, Ser: 1.18 mg/dL (ref 0.76–1.27)
GFR calc non Af Amer: 56 mL/min/{1.73_m2} — ABNORMAL LOW (ref >59)
GFR, EST AFRICAN AMERICAN: 65 mL/min/{1.73_m2} (ref >59)
Glucose: 87 mg/dL (ref 65–99)
POTASSIUM: 4.1 mmol/L (ref 3.5–5.2)
Sodium: 142 mmol/L (ref 134–144)

## 2015-03-29 LAB — LIPID PANEL
CHOL/HDL RATIO: 3.1 ratio (ref 0.0–5.0)
Cholesterol, Total: 155 mg/dL (ref 100–199)
HDL: 50 mg/dL (ref >39)
LDL CALC: 85 mg/dL (ref 0–99)
Triglycerides: 101 mg/dL (ref 0–149)
VLDL Cholesterol Cal: 20 mg/dL (ref 5–40)

## 2015-03-29 LAB — TSH: TSH: 3.97 u[IU]/mL (ref 0.450–4.500)

## 2015-03-29 LAB — HEMOGLOBIN A1C
ESTIMATED AVERAGE GLUCOSE: 131 mg/dL
HEMOGLOBIN A1C: 6.2 % — AB (ref 4.8–5.6)

## 2015-04-01 ENCOUNTER — Ambulatory Visit (INDEPENDENT_AMBULATORY_CARE_PROVIDER_SITE_OTHER): Payer: Medicare Other | Admitting: Internal Medicine

## 2015-04-01 ENCOUNTER — Encounter: Payer: Self-pay | Admitting: Internal Medicine

## 2015-04-01 VITALS — BP 116/60 | HR 52 | Temp 97.6°F | Ht 65.0 in | Wt 168.4 lb

## 2015-04-01 DIAGNOSIS — E785 Hyperlipidemia, unspecified: Secondary | ICD-10-CM | POA: Diagnosis not present

## 2015-04-01 DIAGNOSIS — Z23 Encounter for immunization: Secondary | ICD-10-CM

## 2015-04-01 DIAGNOSIS — E039 Hypothyroidism, unspecified: Secondary | ICD-10-CM | POA: Diagnosis not present

## 2015-04-01 DIAGNOSIS — R972 Elevated prostate specific antigen [PSA]: Secondary | ICD-10-CM | POA: Diagnosis not present

## 2015-04-01 DIAGNOSIS — E1122 Type 2 diabetes mellitus with diabetic chronic kidney disease: Secondary | ICD-10-CM | POA: Diagnosis not present

## 2015-04-01 DIAGNOSIS — R001 Bradycardia, unspecified: Secondary | ICD-10-CM

## 2015-04-01 DIAGNOSIS — N183 Chronic kidney disease, stage 3 unspecified: Secondary | ICD-10-CM

## 2015-04-01 DIAGNOSIS — I10 Essential (primary) hypertension: Secondary | ICD-10-CM | POA: Diagnosis not present

## 2015-04-01 NOTE — Progress Notes (Signed)
Patient ID: Bobby Jensen, male   DOB: [birth year], 82 y.o.   MRN: [id number]    Facility  [place]    Place of Service:   OFFICE    Allergies  Allergen Reactions  . Fioricet [Butalbital-Apap-Caffeine]     Unaware of this allergy per patient.   . Other Other (See Comments)    Profen II DM= urinary outlet obstruction Unaware of this allergy per patient.  [name] [Guaifenesin]     Unaware of this allergy per patient.    Chief Complaint  Patient presents with  . Medical Management of Chronic Issues    6 month follow-up, discuss labs (copy printed). Ongoing chest thightness (followed by cardiology) and dizziness.    . Immunizations    Flu vaccine today, Hold off on pneumonia vaccine for now.   . Medication Management    Patient changed the was he takes Losartan-hyzaar to 1/2 tablet because b/p drops too low     HPI:  Type 2 diabetes mellitus with stage 3 chronic kidney disease, without long-term current use of insulin (HCC) - excellent control   Essential hypertension - controlled  Hyperlipidemia - controlled   Hypothyroidism, unspecified hypothyroidism type - controlled  Bradycardia - has been seen by cardiologist. Bradycardia confirmed at 53 bpm.    it was told that he might be a candidate for pacemaker at some point in the future.   Elevated prostate specific antigen (PSA) -has not had PSA recently   Encounter for immunization    Medications: Patient's Medications  New Prescriptions   No medications on file  Previous Medications   ALPRAZOLAM (XANAX) 0.25 MG TABLET    Take one tablet by mouth up to three times daily for anxity   ASPIRIN 81 MG TABLET    Take 81 mg by mouth daily.   BLOOD GLUCOSE MONITORING SUPPL (ONE TOUCH ULTRA 2) W/DEVICE KIT    Check blood sugar once daily as directed DX 250.00   CHOLECALCIFEROL (VITAMIN D PO)    Take 1,000 Units by mouth daily.    GLIMEPIRIDE (AMARYL) 1 MG TABLET    TAKE 1 TABLET BY MOUTH EVERY DAY   GLUCOSE BLOOD (ONE TOUCH  ULTRA TEST) TEST STRIP    Check blood sugar twice daily   LEVOTHYROXINE (SYNTHROID, LEVOTHROID) 50 MCG TABLET    TAKE 1 TABLET BY MOUTH ONCE DAILY FOR THYROID SUPPLEMENT   LOSARTAN-HYDROCHLOROTHIAZIDE (HYZAAR) 100-25 MG TABLET    1/2 by mouth daily for blood pressure   NITROGLYCERIN (NITROSTAT) 0.4 MG SL TABLET    Place 1 tablet (0.4 mg total) under the tongue every 5 (five) minutes as needed for chest pain.   ONETOUCH DELICA LANCETS 92E MISC    Check blood sugar once daily as directed DX 250.00   SIMVASTATIN (ZOCOR) 20 MG TABLET    TAKE 1 TABLET BY MOUTH EVERY NIGHT AT BEDTIME   TEMAZEPAM (RESTORIL) 15 MG CAPSULE    TAKE 1 CAPSULE BY MOUTH EVERY NIGHT AT BEDTIME AS NEEDED FOR SLEEP   TRIAMCINOLONE CREAM (KENALOG) 0.1 %    Apply topically 2 (two) times daily. Apply to rash as needed.  Modified Medications   No medications on file  Discontinued Medications   GLUCOSE BLOOD (FREESTYLE LITE TEST VI)       LOSARTAN-HYDROCHLOROTHIAZIDE (HYZAAR) 100-25 MG TABLET    Take one tablet by mouth once daily to control blood pressure    Review of Systems  Constitutional: Negative.  Negative for fever, fatigue and unexpected weight change.  HENT: Negative.   Eyes: Positive for visual disturbance (corrective lenses).  Respiratory: Negative for cough and shortness of breath.   Cardiovascular: Positive for chest pain (tightness). Negative for palpitations and leg swelling.  Gastrointestinal: Negative for diarrhea and constipation.  Endocrine:       Diabetic.  Genitourinary: Negative.   Musculoskeletal: Positive for arthralgias. Negative for myalgias, back pain and joint swelling.       Unsteady gait. Denies arthritis. Chronic discomfort in the right hip area.  Skin: Negative.   Allergic/Immunologic: Negative.   Neurological: Positive for dizziness. Negative for weakness and headaches.       Some difficulty with balance. Has a slightly unsteady gait.  Hematological: Negative.   Psychiatric/Behavioral:        Chronic mild anxiety. Worries about his wife. Reports recent irritability    Filed Vitals:   04/01/15 1346  BP: 116/60  Pulse: 52  Temp: 97.6 F (36.4 C)  TempSrc: Oral  Height: _0  (1.651 m)  Weight: 168 lb 6.4 oz (76.386 kg)  SpO2: 96%   Body mass index is 28.02 kg/(m^2). Filed Weights   04/01/15 1346  Weight: 168 lb 6.4 oz (76.386 kg)     Physical Exam  Constitutional: He is oriented to person, place, and time. He appears well-developed and well-nourished. No distress.  Mildly overweight.  HENT:  Head: Normocephalic and atraumatic.  Right Ear: External ear normal.  Left Ear: External ear normal.  Nose: Nose normal.  Mouth/Throat: Oropharynx is clear and moist.  Eyes: Conjunctivae and EOM are normal. Pupils are equal, round, and reactive to light.  Wears corrective lenses. Reduced visual acuity bilaterally.  Neck: Neck supple. No JVD present. No tracheal deviation present. No thyromegaly present.  Cardiovascular: Regular rhythm, normal heart sounds and intact distal pulses.  Exam reveals no gallop and no friction rub.   No murmur heard. Pulmonary/Chest: Breath sounds normal. No respiratory distress. He has no wheezes. He has no rales. He exhibits no tenderness.  Abdominal: Bowel sounds are normal. He exhibits no distension and no mass. There is no tenderness.  Musculoskeletal: Normal range of motion. He exhibits no edema.  Tenderness right hip. Mild gait instability.  Lymphadenopathy:    He has no cervical adenopathy.  Neurological: He is alert and oriented to person, place, and time. No cranial nerve deficit. Coordination normal.  Normal response to vibratory testing.  Skin: No rash noted. No erythema. No pallor.  Psychiatric: He has a normal mood and affect. His behavior is normal. Judgment and thought content normal.    Labs reviewed: Lab Summary Latest Ref Rng 03/28/2015 09/11/2014 05/15/2014 01/11/2014  Hemoglobin 13.0-17.0 g/dL (None) (None) (None) (None)    Hematocrit 39.0-52.0 % (None) (None) (None) (None)  White count - (None) (None) (None) (None)  Platelet count - (None) (None) (None) (None)  Sodium 134 - 144 mmol/L 142 141 140 139  Potassium 3.5 - 5.2 mmol/L 4.1 4.5 3.9 4.4  Calcium 8.6 - 10.2 mg/dL 9.0 9.2 9.2 9.5  Phosphorus - (None) (None) (None) (None)  Creatinine 0.76 - 1.27 mg/dL 1.18 1.11 1.25 1.31(H)  AST 0 - 40 IU/L (None) (None) (None) 13  Alk Phos 39 - 117 IU/L (None) (None) (None) 62  Bilirubin 0.0 - 1.2 mg/dL (None) (None) (None) 0.6  Glucose 65 - 99 mg/dL 87 90 88 102(H)  Cholesterol - (None) (None) (None) (None)  HDL cholesterol >39 mg/dL 50 48 60 57  Triglycerides 0 - 149 mg/dL 101 95 77 86  LDL Direct - (  None) (None) (None) (None)  LDL Calc 0 - 99 mg/dL 85 80 105(H) 109(H)  Total protein - (None) (None) (None) (None)  Albumin 3.5 - 4.7 g/dL (None) (None) (None) 4.6   Lab Results  Component Value Date   TSH 3.970 03/28/2015   TSH 3.320 05/15/2014   TSH 4.570* 07/09/2013   Lab Results  Component Value Date   BUN 20 03/28/2015   BUN 25 09/11/2014   BUN 31* 05/15/2014   Lab Results  Component Value Date   HGBA1C 6.2* 03/28/2015   HGBA1C 6.2* 09/11/2014   HGBA1C 6.2* 05/15/2014    Assessment/Plan 1. Type 2 diabetes mellitus with stage 3 chronic kidney disease, without long-term current use of insulin (HCC) - Hemoglobin A1c; Future - Comprehensive metabolic panel; Future  2. Essential hypertension - Comprehensive metabolic panel; Future  3. Hyperlipidemia - Lipid panel; Future  4. Hypothyroidism, unspecified hypothyroidism type -TSH, future  5. Bradycardia -Will EKG, future  6. Elevated prostate specific antigen (PSA) - PSA; Future  7. Encounter for immunization quadrivalent flu vaccine given

## 2015-04-23 ENCOUNTER — Other Ambulatory Visit: Payer: Self-pay | Admitting: Internal Medicine

## 2015-04-24 ENCOUNTER — Other Ambulatory Visit: Payer: Self-pay | Admitting: Internal Medicine

## 2015-04-29 ENCOUNTER — Other Ambulatory Visit: Payer: Self-pay | Admitting: *Deleted

## 2015-04-29 MED ORDER — ALPRAZOLAM 0.25 MG PO TABS: ORAL_TABLET | ORAL | Status: DC

## 2015-04-29 NOTE — Telephone Encounter (Signed)
Patient requested. Phoned into pharmacy.  

## 2015-05-03 ENCOUNTER — Emergency Department (HOSPITAL_COMMUNITY)
Admission: EM | Admit: 2015-05-03 | Discharge: 2015-05-03 | Disposition: A | Discharge status: Home / Self Care | Payer: Medicare Other | Attending: Emergency Medicine | Admitting: Emergency Medicine

## 2015-05-03 ENCOUNTER — Encounter (HOSPITAL_COMMUNITY): Payer: Self-pay | Admitting: Emergency Medicine

## 2015-05-03 DIAGNOSIS — Z8611 Personal history of tuberculosis: Secondary | ICD-10-CM | POA: Diagnosis not present

## 2015-05-03 DIAGNOSIS — G4700 Insomnia, unspecified: Secondary | ICD-10-CM | POA: Diagnosis not present

## 2015-05-03 DIAGNOSIS — Z7982 Long term (current) use of aspirin: Secondary | ICD-10-CM | POA: Diagnosis not present

## 2015-05-03 DIAGNOSIS — Z87442 Personal history of urinary calculi: Secondary | ICD-10-CM | POA: Insufficient documentation

## 2015-05-03 DIAGNOSIS — E119 Type 2 diabetes mellitus without complications: Secondary | ICD-10-CM | POA: Diagnosis not present

## 2015-05-03 DIAGNOSIS — S0502XA Injury of conjunctiva and corneal abrasion without foreign body, left eye, initial encounter: Secondary | ICD-10-CM | POA: Insufficient documentation

## 2015-05-03 DIAGNOSIS — Y9389 Activity, other specified: Secondary | ICD-10-CM | POA: Diagnosis not present

## 2015-05-03 DIAGNOSIS — Z87891 Personal history of nicotine dependence: Secondary | ICD-10-CM | POA: Insufficient documentation

## 2015-05-03 DIAGNOSIS — Z8739 Personal history of other diseases of the musculoskeletal system and connective tissue: Secondary | ICD-10-CM | POA: Insufficient documentation

## 2015-05-03 DIAGNOSIS — Y9289 Other specified places as the place of occurrence of the external cause: Secondary | ICD-10-CM | POA: Diagnosis not present

## 2015-05-03 DIAGNOSIS — Y998 Other external cause status: Secondary | ICD-10-CM | POA: Insufficient documentation

## 2015-05-03 DIAGNOSIS — Z7984 Long term (current) use of oral hypoglycemic drugs: Secondary | ICD-10-CM | POA: Insufficient documentation

## 2015-05-03 DIAGNOSIS — I10 Essential (primary) hypertension: Secondary | ICD-10-CM | POA: Insufficient documentation

## 2015-05-03 DIAGNOSIS — Z872 Personal history of diseases of the skin and subcutaneous tissue: Secondary | ICD-10-CM | POA: Insufficient documentation

## 2015-05-03 DIAGNOSIS — Z79899 Other long term (current) drug therapy: Secondary | ICD-10-CM | POA: Diagnosis not present

## 2015-05-03 DIAGNOSIS — Z8711 Personal history of peptic ulcer disease: Secondary | ICD-10-CM | POA: Insufficient documentation

## 2015-05-03 DIAGNOSIS — X58XXXA Exposure to other specified factors, initial encounter: Secondary | ICD-10-CM | POA: Diagnosis not present

## 2015-05-03 DIAGNOSIS — E785 Hyperlipidemia, unspecified: Secondary | ICD-10-CM | POA: Diagnosis not present

## 2015-05-03 DIAGNOSIS — Z87438 Personal history of other diseases of male genital organs: Secondary | ICD-10-CM | POA: Diagnosis not present

## 2015-05-03 DIAGNOSIS — Z8719 Personal history of other diseases of the digestive system: Secondary | ICD-10-CM | POA: Diagnosis not present

## 2015-05-03 DIAGNOSIS — G510 Bell's palsy: Secondary | ICD-10-CM

## 2015-05-03 DIAGNOSIS — R29810 Facial weakness: Secondary | ICD-10-CM | POA: Diagnosis present

## 2015-05-03 DIAGNOSIS — E039 Hypothyroidism, unspecified: Secondary | ICD-10-CM | POA: Diagnosis not present

## 2015-05-03 MED ORDER — PREDNISONE 20 MG PO TABS: 40.0000 mg | ORAL_TABLET | Freq: Every day | ORAL | Status: DC

## 2015-05-03 MED ORDER — TETRACAINE HCL 0.5 % OP SOLN
1.0000 [drp] | Freq: Once | OPHTHALMIC | Status: DC
  Filled 2015-05-03: qty 4

## 2015-05-03 MED ORDER — FLUORESCEIN SODIUM 1 MG OP STRP
1.0000 | ORAL_STRIP | Freq: Once | OPHTHALMIC | Status: DC
  Filled 2015-05-03: qty 1

## 2015-05-03 MED ORDER — HYPROMELLOSE (GONIOSCOPIC) 2.5 % OP SOLN: 1.0000 [drp] | OPHTHALMIC | Status: DC

## 2015-05-03 NOTE — ED Provider Notes (Signed)
CSN: [id number]     Arrival date & time 05/03/15  1704 History   First MD Initiated Contact with Patient 05/03/15 1718     Chief Complaint  Patient presents with  . facial drooping      (Consider location/radiation/quality/duration/timing/severity/associated sxs/prior Treatment) HPI Comments: Patient presents to the emergency department with chief complaint of facial droop and eye irritation. Patient states that he noticed that the left side of his face was asymmetrical this morning when he woke. He states that [place], and started to feel some irritation in his left eye. He denies getting anything in it. States that he has been unable to completely close his eye. Additionally, he states that he kept on biting his left lower lip while trying to chew gum. He denies any abnormal taste. He denies any other associated numbness, weakness, or tingling. He states that he had a headache a couple of days ago, but he took an aspirin for this resolved. He denies any slurred speech. Denies any ataxia. There are no modifying factors.  The history is provided by the patient. No language interpreter was used.    Past Medical History  Diagnosis Date  . Hypertension 07/08/1998  . Type II or unspecified type diabetes mellitus without mention of complication, not stated as uncontrolled 07/15/2006  . Kidney calculi 08/20/2009  . Unspecified constipation 08/05/2010  . Unspecified hypothyroidism 12/17/2009  . Pain in joint, pelvic region and thigh 12/17/2009  . Rosacea 08/20/2009  . Inguinal hernia without mention of obstruction or gangrene, unilateral or unspecified, (not specified as recurrent) 11/20/2008  . Bladder neck obstruction 03/28/2008  . Other and unspecified hyperlipidemia 12/23/2004  . Other malaise and fatigue 12/23/2004  . Pain in joint, shoulder region 11/01/2003  . Insomnia, unspecified 11/01/2003  . Peptic ulcer, unspecified site, unspecified as acute or chronic, without  mention of hemorrhage, perforation, or obstruction 06/25/2003  . 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 Elevated prostate specific antigen (PSA) 03/15/1998  . Gastric ulcer, unspecified as acute or chronic, without mention of hemorrhage, perforation, or obstruction 03/15/1993  . Hypertrophy of prostate without urinary obstruction and other lower urinary tract symptoms (LUTS) 03/15/1989  . Contact with or exposure to tuberculosis 03/15/1992  . Personal history of malaria 03/16/1939   Past Surgical History  Procedure Laterality Date  . Prostate surgery    . Total hip arthroplasty Right 07/10/2009    Dr. Telford Nab  . Appendectomy  1956  . Pilonidal cyst excision  1963  . Hemorroidectomy  06/2002    Dr. Lennie Hummer  . Hernia repair Bilateral 11/2008    inguinal   Family History  Problem Relation Age of Onset  . Heart disease Father    Social History  Substance Use Topics  . Smoking status: Former Smoker    Quit date: 07/31/1978  . Smokeless tobacco: None  . Alcohol Use: Yes    Review of Systems  Constitutional: Negative for fever and chills.  Eyes: Positive for pain and redness.  Respiratory: Negative for shortness of breath.   Cardiovascular: Negative for chest pain.  Gastrointestinal: Negative for nausea, vomiting, diarrhea and constipation.  Genitourinary: Negative for dysuria.  Neurological: Positive for facial asymmetry.  All other systems reviewed and are negative.     Allergies  Fioricet; Other; and Tussin  Home Medications   Prior to Admission medications   Medication Sig Start Date End Date Taking? Authorizing Provider  ALPRAZolam Duanne Moron) 0.25 MG tablet Take one tablet by mouth  up to three times daily for anxiety 04/29/15   Estill Dooms, MD  aspirin 81 MG tablet Take 81 mg by mouth daily.    Historical Provider, MD  Blood Glucose Monitoring Suppl (ONE TOUCH ULTRA 2) W/DEVICE KIT Check blood sugar once daily as directed DX 250.00 03/06/13   Estill Dooms, MD   Cholecalciferol (VITAMIN D PO) Take 1,000 Units by mouth daily.     Historical Provider, MD  glimepiride (AMARYL) 1 MG tablet TAKE 1 TABLET BY MOUTH EVERY DAY 08/16/14   Estill Dooms, MD  glucose blood (ONE TOUCH ULTRA TEST) test strip Check blood sugar twice daily 07/11/13   Estill Dooms, MD  levothyroxine (SYNTHROID, LEVOTHROID) 50 MCG tablet TAKE 1 TABLET BY MOUTH ONCE DAILY FOR THYROID SUPPLEMENT 10/23/14   Estill Dooms, MD  levothyroxine (SYNTHROID, LEVOTHROID) 50 MCG tablet TAKE 1 TABLET BY MOUTH ONCE DAILY FOR THYROID SUPPLEMENT 04/24/15   Tiffany L Reed, DO  losartan-hydrochlorothiazide (HYZAAR) 100-25 MG tablet 1/2 by mouth daily for blood pressure    Historical Provider, MD  nitroGLYCERIN (NITROSTAT) 0.4 MG SL tablet Place 1 tablet (0.4 mg total) under the tongue every 5 (five) minutes as needed for chest pain. 11/01/14   Dorothy Spark, MD  [facility] DELICA LANCETS 32D MISC Check blood sugar once daily as directed DX 250.00 03/06/13   Estill Dooms, MD  simvastatin (ZOCOR) 20 MG tablet TAKE 1 TABLET BY MOUTH EVERY NIGHT AT BEDTIME 03/20/15   Estill Dooms, MD  temazepam (RESTORIL) 15 MG capsule TAKE 1 CAPSULE BY MOUTH EVERY NIGHT AT BEDTIME AS NEEDED FOR SLEEP 01/09/15   Tiffany L Reed, DO  triamcinolone cream (KENALOG) 0.1 % Apply topically 2 (two) times daily. Apply to rash as needed. 10/07/14   Estill Dooms, MD   BP 159/79 mmHg  Pulse 62  Temp(Src) 97.6 F (36.4 C) (Oral)  Resp 16  SpO2 98% Physical Exam  Constitutional: He is oriented to person, place, and time. He appears well-developed and well-nourished.  HENT:  Head: Normocephalic and atraumatic.  Eyes: Conjunctivae and EOM are normal. Pupils are equal, round, and reactive to light. Right eye exhibits no discharge. Left eye exhibits no discharge. No scleral icterus.  Left conjunctival erythema Watery discharge of left eye TMs clear Mild fluorescein uptake at the 4:00 position  Neck: Normal range of motion. Neck  supple. No JVD present.  Cardiovascular: Normal rate, regular rhythm and normal heart sounds.  Exam reveals no gallop and no friction rub.   No murmur heard. Pulmonary/Chest: Effort normal and breath sounds normal. No respiratory distress. He has no wheezes. He has no rales. He exhibits no tenderness.  Abdominal: Soft. He exhibits no distension and no mass. There is no tenderness. There is no rebound and no guarding.  Musculoskeletal: Normal range of motion. He exhibits no edema or tenderness.  Neurological: He is alert and oriented to person, place, and time.  Left sided facial droop involving the forehead No slurred speech Movements are goal oriented Normal sensation and strength throughout in extremities  Skin: Skin is warm and dry.  Psychiatric: He has a normal mood and affect. His behavior is normal. Judgment and thought content normal.  Nursing note and vitals reviewed.   ED Course  Procedures (including critical care time)   MDM   Final diagnoses:  Bell's palsy  Corneal abrasion, left, initial encounter    Patient with left sided facial droop.  It involves the forehead.  I  am suspicious of Bell's palsy, but find it concerning that the patient had a headache a day or so ago.  Patient is not a code stroke because of length of symptoms.  Onset when he awoke this morning.  Not a TPA candidate because of length of symptoms.  No other neurovascular deficits.  Patient seen by and discussed with Dr. Oleta Mouse, who agrees that symptoms are consistent with Bells's Palsy.  Mild fluorescein uptake in left eye. Recommend artificial tears hourly. Encouraged patient to tape eyelid closed when he sleeps.     Montine Circle, PA-C 05/03/15 1853  Forde Dandy, MD 05/04/15 [phone]

## 2015-05-03 NOTE — ED Provider Notes (Signed)
Medical screening examination/treatment/procedure(s) were conducted as a shared visit with non-physician practitioner(s) and myself.  I personally evaluated the patient during the encounter.   EKG Interpretation None      82 year old male who presents with left-sided facial droop. History of hypertension, diabetes. Reports onset of facial droop this morning after waking up from sleep. Throughout the day has noted some redness and irritation in the left eye, and felt that he kept biting his left cheek and lower lip while chewing gum. Denies any recent illnesses, facial rash rashes, extremity numbness or weakness, ataxia, double or blurry vision, dysarthria or aphasia. States he otherwise feels as if he isn't his usual state of health. Vital signs stable on arrival. he has evidence of left facial droop, that includes the forehead. The remainder of his neuro exam is intact. Presentation suggestive of Bell's palsy, and no other findings that would be concerning for an acute CVA. Will discharge with eye care instructions and steroid burst. Strict return instructions and warning signs for potential stroke reviewed with patient and family.   Forde Dandy, MD 05/03/15 [phone]

## 2015-05-03 NOTE — ED Notes (Addendum)
Pt report occipital area headache, woke up this morning with left aye pain and facial drooping. Pt presents with redness and edema  on left eye. Hx HTN. Pt denies weakness to extremities, yet slurred speech and facial numbness per pt.  Alert and oriented x 4.

## 2015-05-03 NOTE — Discharge Instructions (Signed)
Please follow-up with ophthalmology in 3 days. Please follow-up with your Primary Care Provider in 3 days. Please use the artificial tears every hour that you are awake. Please tape your left eyelid closed at night.  Bell Palsy Bell palsy is a condition in which the muscles on one side of the face become paralyzed. This often causes one side of the face to droop. It is a common condition and most people recover completely. RISK FACTORS Risk factors for Bell palsy include:  Pregnancy.  Diabetes.  An infection by a virus, such as infections that cause cold sores. CAUSES  Bell palsy is caused by damage to or inflammation of a nerve in your face. It is unclear why this happens, but an infection by a virus may lead to it. Most of the time the reason it happens is unknown. SIGNS AND SYMPTOMS  Symptoms can range from mild to severe and can take place over a number of hours. Symptoms may include:  Being unable to:  Raise one or both eyebrows.  Close one or both eyes.  Feel parts of your face (facial numbness).  Drooping of the eyelid and corner of the mouth.  Weakness in the face.  Paralysis of half your face.  Loss of taste.  Sensitivity to loud noises.  Difficulty chewing.  Tearing up of the affected eye.  Dryness in the affected eye.  Drooling.  Pain behind one ear. DIAGNOSIS  Diagnosis of Bell palsy may include:  A medical history and physical exam.  An MRI.  A CT scan.  Electromyography (EMG). This is a test that checks how your nerves are working. TREATMENT  Treatment may include antiviral medicine to help shorten the length of the condition. Sometimes treatment is not needed and the symptoms go away on their own. HOME CARE INSTRUCTIONS   Take medicines only as directed by your health care provider.  Do facial massages and exercises as directed by your health care provider.  If your eye is affected:  Use moisturizing eye drops to prevent drying of your  eye as directed by your health care provider.  Protect your eye as directed by your health care provider. SEEK MEDICAL CARE IF:  Your symptoms do not get better or get worse.  You are drooling.  Your eye is red, irritated, or hurts. SEEK IMMEDIATE MEDICAL CARE IF:   Another part of your body feels weak or numb.  You have difficulty swallowing.  You have a fever along with symptoms of Bell palsy.  You develop neck pain. MAKE SURE YOU:   Understand these instructions.  Will watch your condition.  Will get help right away if you are not doing well or get worse.   This information is not intended to replace advice given to you by your health care provider. Make sure you discuss any questions you have with your health care provider.   Document Released: 03/01/2005 Document Revised: 11/20/2014 Document Reviewed: 06/08/2013 Elsevier Interactive Patient Education 2016 [employer]  Corneal Abrasion The cornea is the clear covering at the front and center of the eye. When looking at the colored portion of the eye (iris), you are looking through the cornea. This very thin tissue is made up of many layers. The surface layer is a single layer of cells (corneal epithelium) and is one of the most sensitive tissues in the body. If a scratch or injury causes the corneal epithelium to come off, it is called a corneal abrasion. If the injury extends to  the tissues below the epithelium, the condition is called a corneal ulcer. CAUSES   Scratches.  Trauma.  Foreign body in the eye. Some people have recurrences of abrasions in the area of the original injury even after it has healed (recurrent erosion syndrome). Recurrent erosion syndrome generally improves and goes away with time. SYMPTOMS   Eye pain.  Difficulty or inability to keep the injured eye open.  The eye becomes very sensitive to light.  Recurrent erosions tend to happen suddenly, first thing in the morning, usually after  waking up and opening the eye. DIAGNOSIS  Your health care provider can diagnose a corneal abrasion during an eye exam. Dye is usually placed in the eye using a drop or a small paper strip moistened by your tears. When the eye is examined with a special light, the abrasion shows up clearly because of the dye. TREATMENT   Small abrasions may be treated with antibiotic drops or ointment alone.  A pressure patch may be put over the eye. If this is done, follow your doctor's instructions for when to remove the patch. Do not drive or use machines while the eye patch is on. Judging distances is hard to do with a patch on. If the abrasion becomes infected and spreads to the deeper tissues of the cornea, a corneal ulcer can result. This is serious because it can cause corneal scarring. Corneal scars interfere with light passing through the cornea and cause a loss of vision in the involved eye. HOME CARE INSTRUCTIONS  Use medicine or ointment as directed. Only take over-the-counter or prescription medicines for pain, discomfort, or fever as directed by your health care provider.  Do not drive or operate machinery if your eye is patched. Your ability to judge distances is impaired.  If your health care provider has given you a follow-up appointment, it is very important to keep that appointment. Not keeping the appointment could result in a severe eye infection or permanent loss of vision. If there is any problem keeping the appointment, let your health care provider know. SEEK MEDICAL CARE IF:   You have pain, light sensitivity, and a scratchy feeling in one eye or both eyes.  Your pressure patch keeps loosening up, and you can blink your eye under the patch after treatment.  Any kind of discharge develops from the eye after treatment or if the lids stick together in the morning.  You have the same symptoms in the morning as you did with the original abrasion days, weeks, or months after the abrasion  healed.   This information is not intended to replace advice given to you by your health care provider. Make sure you discuss any questions you have with your health care provider.   Document Released: 02/27/2000 Document Revised: 11/20/2014 Document Reviewed: 11/06/2012 Elsevier Interactive Patient Education [employer].

## 2015-05-06 ENCOUNTER — Encounter: Payer: Self-pay | Admitting: Internal Medicine

## 2015-05-06 ENCOUNTER — Ambulatory Visit (INDEPENDENT_AMBULATORY_CARE_PROVIDER_SITE_OTHER): Payer: Medicare Other | Admitting: Internal Medicine

## 2015-05-06 VITALS — BP 128/64 | HR 53 | Temp 97.4°F | Resp 20 | Ht 65.0 in | Wt 166.0 lb

## 2015-05-06 DIAGNOSIS — N183 Chronic kidney disease, stage 3 (moderate): Secondary | ICD-10-CM | POA: Diagnosis not present

## 2015-05-06 DIAGNOSIS — I10 Essential (primary) hypertension: Secondary | ICD-10-CM | POA: Diagnosis not present

## 2015-05-06 DIAGNOSIS — E1122 Type 2 diabetes mellitus with diabetic chronic kidney disease: Secondary | ICD-10-CM

## 2015-05-06 DIAGNOSIS — G510 Bell's palsy: Secondary | ICD-10-CM | POA: Diagnosis not present

## 2015-05-06 DIAGNOSIS — H16212 Exposure keratoconjunctivitis, left eye: Secondary | ICD-10-CM | POA: Diagnosis not present

## 2015-05-06 NOTE — Progress Notes (Signed)
Patient ID: Bobby Jensen, male   DOB: 1927/11/09, 82 y.o.   MRN: [id number]   Location:   [place]   Place of Service:   OFFICE Provider: Jeanmarie Hubert, MD  Advanced Directives 04/01/2015  Does patient have an advance directive? No     Chief Complaint  Patient presents with  . Hospitalization Follow-up    Bell's Palsy    HPI: Patient is a 82 y.o. male seen today for Bell's palsy left face. Has been on prednisone. Has seen ophth and had a stitch placed laterally in the left side of the face. Denies any pain or discomfort in the face. Denies weakness of the left arm or leg.   Past Medical History  Diagnosis Date  . Hypertension 07/08/1998  . Type II or unspecified type diabetes mellitus without mention of complication, not stated as uncontrolled 07/15/2006  . Kidney calculi 08/20/2009  . Unspecified constipation 08/05/2010  . Unspecified hypothyroidism 12/17/2009  . Pain in joint, pelvic region and thigh 12/17/2009  . Rosacea 08/20/2009  . Inguinal hernia without mention of obstruction or gangrene, unilateral or unspecified, (not specified as recurrent) 11/20/2008  . Bladder neck obstruction 03/28/2008  . Other and unspecified hyperlipidemia 12/23/2004  . Other malaise and fatigue 12/23/2004  . Pain in joint, shoulder region 11/01/2003  . Insomnia, unspecified 11/01/2003  . Peptic ulcer, unspecified site, unspecified as acute or chronic, without mention of hemorrhage, perforation, or obstruction 06/25/2003  . 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 Elevated prostate specific antigen (PSA) 03/15/1998  . Gastric ulcer, unspecified as acute or chronic, without mention of hemorrhage, perforation, or obstruction 03/15/1993  . Hypertrophy of prostate without urinary obstruction and other lower urinary tract symptoms (LUTS) 03/15/1989  . Contact with or exposure to tuberculosis 03/15/1992  . Personal history of malaria 03/16/1939    Past Surgical History  Procedure Laterality Date   . Prostate surgery    . Total hip arthroplasty Right 07/10/2009    Dr. Telford Nab  . Appendectomy  1956  . Pilonidal cyst excision  1963  . Hemorroidectomy  06/2002    Dr. Lennie Hummer  . Hernia repair Bilateral 11/2008    inguinal    Allergies  Allergen Reactions  . Fioricet [Butalbital-Apap-Caffeine]     Unaware of this allergy per patient.   . Other Other (See Comments)    Profen II DM= urinary outlet obstruction Unaware of this allergy per patient.  [name] [Guaifenesin]     Unaware of this allergy per patient.      Medication List       This list is accurate as of: 05/06/15  3:53 PM.  Always use your most recent med list.               ALPRAZolam 0.25 MG tablet  Commonly known as:  XANAX  Take one tablet by mouth up to three times daily for anxiety     aspirin 81 MG tablet  Take 81 mg by mouth daily.     erythromycin ophthalmic ointment  Place 1 application into the left eye 4 (four) times daily.     glimepiride 1 MG tablet  Commonly known as:  AMARYL  TAKE 1 TABLET BY MOUTH EVERY DAY     glucose blood test strip  Commonly known as:  ONE TOUCH ULTRA TEST  Check blood sugar twice daily     hydroxypropyl methylcellulose / hypromellose 2.5 % ophthalmic solution  Commonly known as:  ISOPTO TEARS / [place]aca  1 drop into the left eye every 1 hour x 2 doses.     levothyroxine 50 MCG tablet  Commonly known as:  SYNTHROID, LEVOTHROID  TAKE 1 TABLET BY MOUTH ONCE DAILY FOR THYROID SUPPLEMENT     losartan-hydrochlorothiazide 100-25 MG tablet  Commonly known as:  HYZAAR  1/2 by mouth daily for blood pressure     nitroGLYCERIN 0.4 MG SL tablet  Commonly known as:  NITROSTAT  Place 1 tablet (0.4 mg total) under the tongue every 5 (five) minutes as needed for chest pain.     ONE TOUCH ULTRA 2 w/Device Kit  Check blood sugar once daily as directed DX 678.93     ONETOUCH DELICA LANCETS 81O Misc  Check blood sugar once daily as directed DX 250.00      predniSONE 20 MG tablet  Commonly known as:  DELTASONE  Take 2 tablets (40 mg total) by mouth daily.     simvastatin 20 MG tablet  Commonly known as:  ZOCOR  TAKE 1 TABLET BY MOUTH EVERY NIGHT AT BEDTIME     temazepam 15 MG capsule  Commonly known as:  RESTORIL  TAKE 1 CAPSULE BY MOUTH EVERY NIGHT AT BEDTIME AS NEEDED FOR SLEEP     triamcinolone cream 0.1 %  Commonly known as:  KENALOG  Apply topically 2 (two) times daily. Apply to rash as needed.     VITAMIN D PO  Take 1,000 Units by mouth daily.        Review of Systems:  Review of Systems  Constitutional: Negative.  Negative for fever, fatigue and unexpected weight change.  HENT:       Left facial droop onset 05/03/2015.  Eyes: Positive for visual disturbance (corrective lenses).  Respiratory: Negative for cough and shortness of breath.   Cardiovascular: Positive for chest pain (tightness). Negative for palpitations and leg swelling.  Gastrointestinal: Negative for diarrhea and constipation.  Endocrine:       Diabetic.  Genitourinary: Negative.   Musculoskeletal: Positive for arthralgias. Negative for myalgias, back pain and joint swelling.       Unsteady gait. Denies arthritis. Chronic discomfort in the right hip area.  Skin: Negative.   Allergic/Immunologic: Negative.   Neurological: Positive for dizziness. Negative for weakness and headaches.       Some difficulty with balance. Has a slightly unsteady gait.  Hematological: Negative.   Psychiatric/Behavioral:       Chronic mild anxiety. Worries about his wife. Reports recent irritability    Health Maintenance  Topic Date Due  . OPHTHALMOLOGY EXAM  10/[birth year]/1939  . ZOSTAVAX  10/20/1989  . PNA vac Low Risk Adult (1 of 2 - PCV13) 10/21/1994  . FOOT EXAM  05/22/2015  . HEMOGLOBIN A1C  09/25/2015  . INFLUENZA VACCINE  10/14/2015  . TETANUS/TDAP  03/06/2023    Physical Exam: Filed Vitals:   05/06/15 1455  BP: 128/64  Pulse: 53  Temp: 97.4 F (36.3 C)    TempSrc: Oral  Resp: 20  Height: 5' 5"  (1.651 m)  Weight: 166 lb (75.297 kg)  SpO2: 96%   Body mass index is 27.62 kg/(m^2). Physical Exam  Constitutional: He is oriented to person, place, and time. He appears well-developed and well-nourished. No distress.  Mildly overweight.  HENT:  Head: Normocephalic and atraumatic.  Right Ear: External ear normal.  Left Ear: External ear normal.  Nose: Nose normal.  Mouth/Throat: Oropharynx is clear and moist.  Eyes: Conjunctivae and EOM are normal. Pupils are equal, round, and reactive  to light.  Wears corrective lenses. Reduced visual acuity bilaterally.  Neck: Neck supple. No JVD present. No tracheal deviation present. No thyromegaly present.  Cardiovascular: Regular rhythm, normal heart sounds and intact distal pulses.  Exam reveals no gallop and no friction rub.   No murmur heard. Pulmonary/Chest: Breath sounds normal. No respiratory distress. He has no wheezes. He has no rales. He exhibits no tenderness.  Abdominal: Bowel sounds are normal. He exhibits no distension and no mass. There is no tenderness.  Musculoskeletal: Normal range of motion. He exhibits no edema.  Tenderness right hip. Mild gait instability.  Lymphadenopathy:    He has no cervical adenopathy.  Neurological: He is alert and oriented to person, place, and time. No cranial nerve deficit. Coordination normal.  Normal response to vibratory testing. Severe left facial drooping Stitch in the lateral  corner of the left eye.  Skin: No rash noted. No erythema. No pallor.  Psychiatric: He has a normal mood and affect. His behavior is normal. Judgment and thought content normal.    Labs reviewed: Basic Metabolic Panel:  Recent Labs  05/15/14 0815 09/11/14 0813 03/28/15 0828  NA 140 141 142  K 3.9 4.5 4.1  CL 102 103 103  CO2 24 24 22   GLUCOSE 88 90 87  BUN 31* 25 20  CREATININE 1.25 1.11 1.18  CALCIUM 9.2 9.2 9.0  TSH 3.320  --  3.970   Liver Function  Tests: No results for input(s): AST, ALT, ALKPHOS, BILITOT, PROT, ALBUMIN in the last 8760 hours. No results for input(s): LIPASE, AMYLASE in the last 8760 hours. No results for input(s): AMMONIA in the last 8760 hours. CBC: No results for input(s): WBC, NEUTROABS, HGB, HCT, MCV, PLT in the last 8760 hours. Lipid Panel:  Recent Labs  05/15/14 0815 09/11/14 0813 03/28/15 0828  CHOL 180 147 155  HDL 60 48 50  LDLCALC 105* 80 85  TRIG 77 95 101  CHOLHDL 3.0 3.1 3.1   Lab Results  Component Value Date   HGBA1C 6.2* 03/28/2015    Procedures since last visit: No results found.  Assessment /Plan 1. Bell's palsy Patient was advised that time generally takes care of this and that the group is likely to improve, but there are no guarantees.  2. Essential hypertension Controlled  3. Type 2 diabetes mellitus with stage 3 chronic kidney disease, without long-term current use of insulin (HCC) Excellent control

## 2015-05-11 ENCOUNTER — Other Ambulatory Visit: Payer: Self-pay | Admitting: Internal Medicine

## 2015-05-13 DIAGNOSIS — G510 Bell's palsy: Secondary | ICD-10-CM | POA: Diagnosis not present

## 2015-05-20 DIAGNOSIS — G510 Bell's palsy: Secondary | ICD-10-CM | POA: Diagnosis not present

## 2015-05-26 DIAGNOSIS — G510 Bell's palsy: Secondary | ICD-10-CM | POA: Diagnosis not present

## 2015-05-26 DIAGNOSIS — H16212 Exposure keratoconjunctivitis, left eye: Secondary | ICD-10-CM | POA: Diagnosis not present

## 2015-05-26 DIAGNOSIS — H02105 Unspecified ectropion of left lower eyelid: Secondary | ICD-10-CM | POA: Diagnosis not present

## 2015-06-03 ENCOUNTER — Encounter: Payer: Self-pay | Admitting: Internal Medicine

## 2015-06-03 ENCOUNTER — Ambulatory Visit (INDEPENDENT_AMBULATORY_CARE_PROVIDER_SITE_OTHER): Payer: Medicare Other | Admitting: Internal Medicine

## 2015-06-03 VITALS — BP 122/82 | HR 53 | Temp 97.6°F | Resp 20 | Ht 65.0 in | Wt 165.2 lb

## 2015-06-03 DIAGNOSIS — G510 Bell's palsy: Secondary | ICD-10-CM

## 2015-06-03 NOTE — Progress Notes (Signed)
Patient ID: Bobby Jensen, male   DOB: 1927/11/05, 82 y.o.   MRN: [id number]   Location:  [facility]  Provider: Jeanmarie Hubert, M.D.  Code Status: Full code Goals of Care:  Advanced Directives 04/01/2015  Does patient have an advance directive? No     Chief Complaint  Patient presents with  . Medical Management of Chronic Issues    1 mo f/u    HPI: Patient is a 82 y.o. male seen today for medical management of Bell's Palsy left face onset 05/03/2015. Patient last seen by me 05/06/2015. Has also been seen by ophthalmologist. He had a temporary tarsorrhaphy done. He is having a lot of burning due to drooping of the left lower eyelid. There has been no improvement in the drooping of the left face. He is ready to have full tarsorrhaphy and intends to see the ophthalmologist again soon. There is no pain associated with this, other than the burning of the left eye.   Past Medical History  Diagnosis Date  . Hypertension 07/08/1998  . Type II or unspecified type diabetes mellitus without mention of complication, not stated as uncontrolled 07/15/2006  . Kidney calculi 08/20/2009  . Unspecified constipation 08/05/2010  . Unspecified hypothyroidism 12/17/2009  . Pain in joint, pelvic region and thigh 12/17/2009  . Rosacea 08/20/2009  . Inguinal hernia without mention of obstruction or gangrene, unilateral or unspecified, (not specified as recurrent) 11/20/2008  . Bladder neck obstruction 03/28/2008  . Other and unspecified hyperlipidemia 12/23/2004  . Other malaise and fatigue 12/23/2004  . Pain in joint, shoulder region 11/01/2003  . Insomnia, unspecified 11/01/2003  . Peptic ulcer, unspecified site, unspecified as acute or chronic, without mention of hemorrhage, perforation, or obstruction 06/25/2003  . 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 Elevated prostate specific antigen (PSA) 03/15/1998  . Gastric ulcer, unspecified as acute or chronic, without mention of hemorrhage,  perforation, or obstruction 03/15/1993  . Hypertrophy of prostate without urinary obstruction and other lower urinary tract symptoms (LUTS) 03/15/1989  . Contact with or exposure to tuberculosis 03/15/1992  . Personal history of malaria 03/16/1939    Past Surgical History  Procedure Laterality Date  . Prostate surgery    . Total hip arthroplasty Right 07/10/2009    Dr. Telford Nab  . Appendectomy  1956  . Pilonidal cyst excision  1963  . Hemorroidectomy  06/2002    Dr. Lennie Hummer  . Hernia repair Bilateral 11/2008    inguinal    Allergies  Allergen Reactions  . Fioricet [Butalbital-Apap-Caffeine]     Unaware of this allergy per patient.   . Other Other (See Comments)    Profen II DM= urinary outlet obstruction Unaware of this allergy per patient.  [name] [Guaifenesin]     Unaware of this allergy per patient.      Medication List       This list is accurate as of: 06/03/15  4:18 PM.  Always use your most recent med list.               ALPRAZolam 0.25 MG tablet  Commonly known as:  XANAX  Take one tablet by mouth up to three times daily for anxiety     aspirin 81 MG tablet  Take 81 mg by mouth daily.     erythromycin ophthalmic ointment  Place 1 application into the left eye 4 (four) times daily.     glimepiride 1 MG tablet  Commonly known as:  AMARYL  TAKE 1 TABLET BY  MOUTH EVERY DAY     hydroxypropyl methylcellulose / hypromellose 2.5 % ophthalmic solution  Commonly known as:  ISOPTO TEARS / GONIOVISC  Place 1 drop into the left eye every 1 hour x 2 doses.     levothyroxine 50 MCG tablet  Commonly known as:  SYNTHROID, LEVOTHROID  TAKE 1 TABLET BY MOUTH ONCE DAILY FOR THYROID SUPPLEMENT     losartan-hydrochlorothiazide 100-25 MG tablet  Commonly known as:  HYZAAR  1/2 by mouth daily for blood pressure     nitroGLYCERIN 0.4 MG SL tablet  Commonly known as:  NITROSTAT  Place 1 tablet (0.4 mg total) under the tongue every 5 (five) minutes as needed for  chest pain.     ONE TOUCH ULTRA 2 w/Device Kit  Check blood sugar once daily as directed DX 250.00     ONE TOUCH ULTRA TEST test strip  Generic drug:  glucose blood  CHECK BLOOD SUGAR ONCE DAILY AS DIRECTED     ONETOUCH DELICA LANCETS 31S Misc  CHECK BLOOD SUGAR ONCE DAILY AS DIRECTED     simvastatin 20 MG tablet  Commonly known as:  ZOCOR  TAKE 1 TABLET BY MOUTH EVERY NIGHT AT BEDTIME     temazepam 15 MG capsule  Commonly known as:  RESTORIL  TAKE 1 CAPSULE BY MOUTH EVERY NIGHT AT BEDTIME AS NEEDED FOR SLEEP     triamcinolone cream 0.1 %  Commonly known as:  KENALOG  Apply topically 2 (two) times daily. Apply to rash as needed.     VITAMIN D PO  Take 1,000 Units by mouth daily.        Review of Systems:  Review of Systems  Constitutional: Negative.  Negative for fever, fatigue and unexpected weight change.  HENT:       Left facial droop onset 05/03/2015.  Eyes: Positive for visual disturbance (corrective lenses).  Respiratory: Negative for cough and shortness of breath.   Cardiovascular: Positive for chest pain (tightness). Negative for palpitations and leg swelling.  Gastrointestinal: Negative for diarrhea and constipation.  Endocrine:       Diabetic.  Genitourinary: Negative.   Musculoskeletal: Positive for arthralgias. Negative for myalgias, back pain and joint swelling.       Unsteady gait. Denies arthritis. Chronic discomfort in the right hip area.  Skin: Negative.   Allergic/Immunologic: Negative.   Neurological: Positive for dizziness. Negative for weakness and headaches.       Some difficulty with balance. Has a slightly unsteady gait.  Hematological: Negative.   Psychiatric/Behavioral:       Chronic mild anxiety. Worries about his wife. Reports recent irritability    Health Maintenance  Topic Date Due  . OPHTHALMOLOGY EXAM  10/21/1939  . ZOSTAVAX  10/20/1989  . PNA vac Low Risk Adult (1 of 2 - PCV13) 10/21/1994  . FOOT EXAM  05/22/2015  .  HEMOGLOBIN A1C  09/25/2015  . INFLUENZA VACCINE  10/14/2015  . TETANUS/TDAP  03/06/2023    Physical Exam: Filed Vitals:   06/03/15 1547  BP: 122/82  Pulse: 53  Temp: 97.6 F (36.4 C)  TempSrc: Oral  Resp: 20  Height: 5' 5"  (1.651 m)  Weight: 165 lb 3.2 oz (74.934 kg)  SpO2: 97%   Body mass index is 27.49 kg/(m^2). Physical Exam  Constitutional: He is oriented to person, place, and time. He appears well-developed and well-nourished. No distress.  Mildly overweight.  HENT:  Head: Normocephalic and atraumatic.  Right Ear: External ear normal.  Left Ear: External ear  normal.  Nose: Nose normal.  Mouth/Throat: Oropharynx is clear and moist.  Eyes: Conjunctivae and EOM are normal. Pupils are equal, round, and reactive to light.  Wears corrective lenses. Reduced visual acuity bilaterally.  Neck: Neck supple. No JVD present. No tracheal deviation present. No thyromegaly present.  Cardiovascular: Regular rhythm, normal heart sounds and intact distal pulses.  Exam reveals no gallop and no friction rub.   No murmur heard. Pulmonary/Chest: Breath sounds normal. No respiratory distress. He has no wheezes. He has no rales. He exhibits no tenderness.  Abdominal: Bowel sounds are normal. He exhibits no distension and no mass. There is no tenderness.  Musculoskeletal: Normal range of motion. He exhibits no edema.  Tenderness right hip. Mild gait instability.  Lymphadenopathy:    He has no cervical adenopathy.  Neurological: He is alert and oriented to person, place, and time. No cranial nerve deficit. Coordination normal.  Normal response to vibratory testing. Severe left facial drooping Stitch in the lateral  corner of the left eye.  Skin: No rash noted. No erythema. No pallor.  Psychiatric: He has a normal mood and affect. His behavior is normal. Judgment and thought content normal.    Labs reviewed: Basic Metabolic Panel:  Recent Labs  09/11/14 0813 03/28/15 0828  NA 141 142    K 4.5 4.1  CL 103 103  CO2 24 22  GLUCOSE 90 87  BUN 25 20  CREATININE 1.11 1.18  CALCIUM 9.2 9.0  TSH  --  3.970   Liver Function Tests: No results for input(s): AST, ALT, ALKPHOS, BILITOT, PROT, ALBUMIN in the last 8760 hours. No results for input(s): LIPASE, AMYLASE in the last 8760 hours. No results for input(s): AMMONIA in the last 8760 hours. CBC: No results for input(s): WBC, NEUTROABS, HGB, HCT, MCV, PLT in the last 8760 hours. Lipid Panel:  Recent Labs  09/11/14 0813 03/28/15 0828  CHOL 147 155  HDL 48 50  LDLCALC 80 85  TRIG 95 101  CHOLHDL 3.1 3.1   Lab Results  Component Value Date   HGBA1C 6.2* 03/28/2015   Assessment/Plan  1. Bell's palsy No improvement. I advised patient I would go ahead with full closure of the left eye in order to avoid the burning sensations.

## 2015-06-13 DIAGNOSIS — H02105 Unspecified ectropion of left lower eyelid: Secondary | ICD-10-CM | POA: Diagnosis not present

## 2015-06-28 ENCOUNTER — Other Ambulatory Visit: Payer: Self-pay | Admitting: Internal Medicine

## 2015-07-25 ENCOUNTER — Other Ambulatory Visit: Payer: Medicare Other

## 2015-07-25 DIAGNOSIS — N183 Chronic kidney disease, stage 3 (moderate): Secondary | ICD-10-CM | POA: Diagnosis not present

## 2015-07-25 DIAGNOSIS — E785 Hyperlipidemia, unspecified: Secondary | ICD-10-CM | POA: Diagnosis not present

## 2015-07-25 DIAGNOSIS — R972 Elevated prostate specific antigen [PSA]: Secondary | ICD-10-CM

## 2015-07-25 DIAGNOSIS — I10 Essential (primary) hypertension: Secondary | ICD-10-CM

## 2015-07-25 DIAGNOSIS — E1122 Type 2 diabetes mellitus with diabetic chronic kidney disease: Secondary | ICD-10-CM | POA: Diagnosis not present

## 2015-07-25 DIAGNOSIS — E039 Hypothyroidism, unspecified: Secondary | ICD-10-CM

## 2015-07-26 LAB — HEMOGLOBIN A1C
ESTIMATED AVERAGE GLUCOSE: 131 mg/dL
HEMOGLOBIN A1C: 6.2 % — AB (ref 4.8–5.6)

## 2015-07-26 LAB — COMPREHENSIVE METABOLIC PANEL
A/G RATIO: 1.8 (ref 1.2–2.2)
ALK PHOS: 63 IU/L (ref 39–117)
ALT: 12 IU/L (ref 0–44)
AST: 18 IU/L (ref 0–40)
Albumin: 4.2 g/dL (ref 3.5–4.7)
BILIRUBIN TOTAL: 0.6 mg/dL (ref 0.0–1.2)
BUN/Creatinine Ratio: 18 (ref 10–24)
BUN: 20 mg/dL (ref 8–27)
CHLORIDE: 101 mmol/L (ref 96–106)
CO2: 24 mmol/L (ref 18–29)
Calcium: 9.6 mg/dL (ref 8.6–10.2)
Creatinine, Ser: 1.13 mg/dL (ref 0.76–1.27)
GFR calc Af Amer: 68 mL/min/{1.73_m2} (ref >59)
GFR, EST NON AFRICAN AMERICAN: 59 mL/min/{1.73_m2} — AB (ref >59)
GLOBULIN, TOTAL: 2.3 g/dL (ref 1.5–4.5)
Glucose: 82 mg/dL (ref 65–99)
POTASSIUM: 4.1 mmol/L (ref 3.5–5.2)
SODIUM: 140 mmol/L (ref 134–144)
Total Protein: 6.5 g/dL (ref 6.0–8.5)

## 2015-07-26 LAB — LIPID PANEL
CHOL/HDL RATIO: 2.9 ratio (ref 0.0–5.0)
CHOLESTEROL TOTAL: 158 mg/dL (ref 100–199)
HDL: 54 mg/dL (ref >39)
LDL Calculated: 89 mg/dL (ref 0–99)
TRIGLYCERIDES: 76 mg/dL (ref 0–149)
VLDL Cholesterol Cal: 15 mg/dL (ref 5–40)

## 2015-07-26 LAB — PSA: Prostate Specific Ag, Serum: 2.7 ng/mL (ref 0.0–4.0)

## 2015-07-26 LAB — TSH: TSH: 4.15 u[IU]/mL (ref 0.450–4.500)

## 2015-07-30 ENCOUNTER — Ambulatory Visit (INDEPENDENT_AMBULATORY_CARE_PROVIDER_SITE_OTHER): Payer: Medicare Other | Admitting: Internal Medicine

## 2015-07-30 ENCOUNTER — Encounter: Payer: Self-pay | Admitting: Internal Medicine

## 2015-07-30 ENCOUNTER — Other Ambulatory Visit: Payer: Self-pay | Admitting: Internal Medicine

## 2015-07-30 VITALS — BP 130/72 | HR 52 | Temp 97.3°F | Ht 65.0 in | Wt 162.0 lb

## 2015-07-30 DIAGNOSIS — E1122 Type 2 diabetes mellitus with diabetic chronic kidney disease: Secondary | ICD-10-CM | POA: Diagnosis not present

## 2015-07-30 DIAGNOSIS — Z0000 Encounter for general adult medical examination without abnormal findings: Secondary | ICD-10-CM | POA: Diagnosis not present

## 2015-07-30 DIAGNOSIS — G510 Bell's palsy: Secondary | ICD-10-CM | POA: Diagnosis not present

## 2015-07-30 DIAGNOSIS — N183 Chronic kidney disease, stage 3 unspecified: Secondary | ICD-10-CM

## 2015-07-30 DIAGNOSIS — I10 Essential (primary) hypertension: Secondary | ICD-10-CM | POA: Diagnosis not present

## 2015-07-30 DIAGNOSIS — R972 Elevated prostate specific antigen [PSA]: Secondary | ICD-10-CM

## 2015-07-30 DIAGNOSIS — E785 Hyperlipidemia, unspecified: Secondary | ICD-10-CM | POA: Diagnosis not present

## 2015-07-30 DIAGNOSIS — E039 Hypothyroidism, unspecified: Secondary | ICD-10-CM

## 2015-07-30 NOTE — Progress Notes (Signed)
Patient ID: Bobby Jensen, male   DOB: Jul 26, 1927, 82 y.o.   MRN: [id number] MMSE 29/30 passed clock drawing

## 2015-07-30 NOTE — Progress Notes (Deleted)
   Subjective:    Patient ID: Bobby Jensen, male    DOB: 03/10/1928, 82 y.o.   MRN: [id number]  HPI    Review of Systems     Objective:   Physical Exam        Assessment & Plan:

## 2015-07-30 NOTE — Progress Notes (Signed)
Patient ID: Bobby Jensen, male   DOB: [birth year], 82 y.o.   MRN: [id number]    HISTORY AND PHYSICAL  Location:    [place]   Place of Service:   OFFICE  Extended Emergency Contact Information Primary Emergency Contact: Mcginniss,Dorothy Address: [place], [place] Phone: [phone] Relation: Spouse  Advanced Directive information Does patient have an advance directive?: Yes, Type of Advance Directive: Living will  Chief Complaint  Patient presents with  . Annual Exam    Wellness exam   . Medical Management of Chronic Issues    blood pressure, cholesterol, thyroid, blood sugar, Bell's palsy left side, review labs  . MMSE    29/30 passed clock drawing    HPI:   Bell's palsy - Continues with left facial droop and slurred speech. This problem started in March 2017 and has shown no improvement.  Type 2 diabetes mellitus with stage 3 chronic kidney disease, without long-term current use of insulin ([place]) - under excellent control  Hypothyroidism, unspecified hypothyroidism type - compensated  Essential hypertension - controlled  Hyperlipidemia -controlled   Elevated prostate specific antigen (PSA) - 2.7 on 07/25/2015    Past Medical History  Diagnosis Date  . Hypertension 07/08/1998  . Type II or unspecified type diabetes mellitus without mention of complication, not stated as uncontrolled 07/15/2006  . Kidney calculi 08/20/2009  . Unspecified constipation 08/05/2010  . Unspecified hypothyroidism 12/17/2009  . Pain in joint, pelvic region and thigh 12/17/2009  . Rosacea 08/20/2009  . Inguinal hernia without mention of obstruction or gangrene, unilateral or unspecified, (not specified as recurrent) 11/20/2008  . Bladder neck obstruction 03/28/2008  . Other and unspecified hyperlipidemia 12/23/2004  . Other malaise and fatigue 12/23/2004  . Pain in joint, shoulder region 11/01/2003  . Insomnia, unspecified  11/01/2003  . Peptic ulcer, unspecified site, unspecified as acute or chronic, without mention of hemorrhage, perforation, or obstruction 06/25/2003  . 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 Elevated prostate specific antigen (PSA) 03/15/1998  . Gastric ulcer, unspecified as acute or chronic, without mention of hemorrhage, perforation, or obstruction 03/15/1993  . Hypertrophy of prostate without urinary obstruction and other lower urinary tract symptoms (LUTS) 03/15/1989  . Contact with or exposure to tuberculosis 03/15/1992  . Personal history of malaria 03/16/1939    Past Surgical History  Procedure Laterality Date  . Prostate surgery    . Total hip arthroplasty Right 07/10/2009    Dr. Telford Nab  . Appendectomy  1956  . Pilonidal cyst excision  1963  . Hemorroidectomy  06/2002    Dr. Lennie Hummer  . Hernia repair Bilateral 11/2008    inguinal    Patient Care Team: Estill Dooms, MD as PCP - General [name], MD as Consulting Physician (Ophthalmology)  Social History   Social History  . Marital Status: Married    Spouse Name: N/A  . Number of Children: N/A  . Years of Education: N/A   Occupational History  . Not on file.   Social History Main Topics  . Smoking status: Former Smoker    Quit date: 07/31/1978  . Smokeless tobacco: Not on file  . Alcohol Use: Yes  . Drug Use: No  . Sexual Activity:    Partners: Female   Other Topics Concern  . Not on file   Social History Narrative    reports that he quit smoking about 37 years ago. He does  not have any smokeless tobacco history on file. He reports that he drinks alcohol. He reports that he does not use illicit drugs.  Family History  Problem Relation Age of Onset  . Heart disease Father    Family Status  Relation Status Death Age  . Father Deceased 46    MI  . Mother Deceased 51    natural causes  . Sister Alive     Age 77  . Daughter Alive   . Son Alive   . Daughter Alive     Immunization  History  Administered Date(s) Administered  . Influenza Split 12/28/2011  . Influenza,inj,Quad PF,36+ Mos 04/01/2015  . Influenza-Unspecified 01/14/2013, 12/10/2013, 12/03/2014  . Pneumococcal-Unspecified 02/15/1992  . Td 03/15/1990  . Tdap 03/05/2013    Allergies  Allergen Reactions  . Fioricet [Butalbital-Apap-Caffeine]     Unaware of this allergy per patient.   . Other Other (See Comments)    Profen II DM= urinary outlet obstruction Unaware of this allergy per patient.  [name] [Guaifenesin]     Unaware of this allergy per patient.    Medications: Patient's Medications  New Prescriptions   No medications on file  Previous Medications   ALPRAZOLAM (XANAX) 0.25 MG TABLET    Take one tablet by mouth up to three times daily for anxiety   ASPIRIN 81 MG TABLET    Take 81 mg by mouth daily.   BLOOD GLUCOSE MONITORING SUPPL (ONE TOUCH ULTRA 2) W/DEVICE KIT    Check blood sugar once daily as directed DX 250.00   CARBOXYMETHYLCELLUL-GLYCERIN (REFRESH OPTIVE OP)    Apply to eye. One drop left eye every hour   CHOLECALCIFEROL (VITAMIN D PO)    Take 1,000 Units by mouth daily.    ERYTHROMYCIN OPHTHALMIC OINTMENT    Place 1 application into the left eye 4 (four) times daily.   GLIMEPIRIDE (AMARYL) 1 MG TABLET    TAKE 1 TABLET BY MOUTH ONCE DAILY   LOSARTAN-HYDROCHLOROTHIAZIDE (HYZAAR) 100-25 MG TABLET    1/2 by mouth daily for blood pressure   NITROGLYCERIN (NITROSTAT) 0.4 MG SL TABLET    Place 1 tablet (0.4 mg total) under the tongue every 5 (five) minutes as needed for chest pain.   ONE TOUCH ULTRA TEST TEST STRIP    CHECK BLOOD SUGAR ONCE DAILY AS DIRECTED   ONETOUCH DELICA LANCETS 32T MISC    CHECK BLOOD SUGAR ONCE DAILY AS DIRECTED   SIMVASTATIN (ZOCOR) 20 MG TABLET    TAKE 1 TABLET BY MOUTH EVERY NIGHT AT BEDTIME   TEMAZEPAM (RESTORIL) 15 MG CAPSULE    TAKE 1 CAPSULE BY MOUTH EVERY NIGHT AT BEDTIME AS NEEDED FOR SLEEP   TRIAMCINOLONE CREAM (KENALOG) 0.1 %    Apply topically 2  (two) times daily. Apply to rash as needed.  Modified Medications   Modified Medication Previous Medication   LEVOTHYROXINE (SYNTHROID, LEVOTHROID) 50 MCG TABLET levothyroxine (SYNTHROID, LEVOTHROID) 50 MCG tablet      TAKE 1 TABLET DAILY FOR THYROID SUPPLEMENT.    TAKE 1 TABLET BY MOUTH ONCE DAILY FOR THYROID SUPPLEMENT  Discontinued Medications   HYDROXYPROPYL METHYLCELLULOSE / HYPROMELLOSE (ISOPTO TEARS / GONIOVISC) 2.5 % OPHTHALMIC SOLUTION    Place 1 drop into the left eye every 1 hour x 2 doses.    Review of Systems  Constitutional: Negative.  Negative for fever, fatigue and unexpected weight change.  HENT:       Left facial droop onset 05/03/2015.  Eyes: Positive for visual disturbance (corrective  lenses).  Respiratory: Negative for cough and shortness of breath.   Cardiovascular: Positive for chest pain (tightness). Negative for palpitations and leg swelling.  Gastrointestinal: Negative for diarrhea and constipation.  Endocrine:       Diabetic.  Genitourinary: Negative.   Musculoskeletal: Positive for arthralgias. Negative for myalgias, back pain and joint swelling.       Unsteady gait. Denies arthritis. Chronic discomfort in the right hip area.  Skin: Negative.   Allergic/Immunologic: Negative.   Neurological: Positive for dizziness. Negative for weakness and headaches.       Some difficulty with balance. Has a slightly unsteady gait.  Hematological: Negative.   Psychiatric/Behavioral:       Chronic mild anxiety. Worries about his wife. Reports recent irritability    Filed Vitals:   07/30/15 1529  BP: 130/72  Pulse: 52  Temp: 97.3 F (36.3 C)  TempSrc: Oral  Height: 5' 5"  (1.651 m)  Weight: 162 lb (73.483 kg)  SpO2: 97%   Body mass index is 26.96 kg/(m^2). Filed Weights   07/30/15 1529  Weight: 162 lb (73.483 kg)     Physical Exam  Constitutional: He is oriented to person, place, and time. He appears well-developed and well-nourished. No distress.  Mildly  overweight.  HENT:  Head: Normocephalic and atraumatic.  Right Ear: External ear normal.  Left Ear: External ear normal.  Nose: Nose normal.  Mouth/Throat: Oropharynx is clear and moist.  Eyes: Conjunctivae and EOM are normal. Pupils are equal, round, and reactive to light.  Wears corrective lenses. Reduced visual acuity bilaterally.  Neck: Neck supple. No JVD present. No tracheal deviation present. No thyromegaly present.  Cardiovascular: Regular rhythm, normal heart sounds and intact distal pulses.  Exam reveals no gallop and no friction rub.   No murmur heard. Pulmonary/Chest: Breath sounds normal. No respiratory distress. He has no wheezes. He has no rales. He exhibits no tenderness.  Abdominal: Bowel sounds are normal. He exhibits no distension and no mass. There is no tenderness.  Musculoskeletal: Normal range of motion. He exhibits no edema.  Tenderness right hip. Mild gait instability.  Lymphadenopathy:    He has no cervical adenopathy.  Neurological: He is alert and oriented to person, place, and time. No cranial nerve deficit. Coordination normal.  Normal response to vibratory testing. Severe left facial drooping Stitch in the lateral  corner of the left eye.  Skin: No rash noted. No erythema. No pallor.  Psychiatric: He has a normal mood and affect. His behavior is normal. Judgment and thought content normal.    Labs reviewed: Lab Summary Latest Ref Rng 07/25/2015 03/28/2015 09/11/2014 05/15/2014  Hemoglobin 13.0-17.0 g/dL (None) (None) (None) (None)  Hematocrit 39.0-52.0 % (None) (None) (None) (None)  White count - (None) (None) (None) (None)  Platelet count - (None) (None) (None) (None)  Sodium 134 - 144 mmol/L 140 142 141 140  Potassium 3.5 - 5.2 mmol/L 4.1 4.1 4.5 3.9  Calcium 8.6 - 10.2 mg/dL 9.6 9.0 9.2 9.2  Phosphorus - (None) (None) (None) (None)  Creatinine 0.76 - 1.27 mg/dL 1.13 1.18 1.11 1.25  AST 0 - 40 IU/L 18 (None) (None) (None)  Alk Phos 39 - 117 IU/L 63  (None) (None) (None)  Bilirubin 0.0 - 1.2 mg/dL 0.6 (None) (None) (None)  Glucose 65 - 99 mg/dL 82 87 90 88  Cholesterol - (None) (None) (None) (None)  HDL cholesterol >39 mg/dL 54 50 48 60  Triglycerides 0 - 149 mg/dL 76 101 95 77  LDL Direct - (  None) (None) (None) (None)  LDL Calc 0 - 99 mg/dL 89 85 80 105(H)  Total protein - (None) (None) (None) (None)  Albumin 3.5 - 4.7 g/dL 4.2 (None) (None) (None)   Lab Results  Component Value Date   BUN 20 07/25/2015   Lab Results  Component Value Date   HGBA1C 6.2* 07/25/2015   Lab Results  Component Value Date   TSH 4.150 07/25/2015   07/25/2015 PSA: 2.7   Assessment/Plan  1. Bell's palsy No signs of improvement  2. Type 2 diabetes mellitus with stage 3 chronic kidney disease, without long-term current use of insulin ([facility]) - Comprehensive metabolic panel; Future - Hemoglobin A1c; Future - Microalbumin, urine; Future  3. Hypothyroidism, unspecified hypothyroidism type Compensated. Continue current dose levothyroxin.  4. Essential hypertension Continue current medications - Comprehensive metabolic panel; Future  5. Hyperlipidemia - Lipid panel; Future  6. Elevated prostate specific antigen (PSA) Normal when last checked

## 2015-08-27 DIAGNOSIS — H16212 Exposure keratoconjunctivitis, left eye: Secondary | ICD-10-CM | POA: Diagnosis not present

## 2015-09-15 ENCOUNTER — Telehealth: Payer: Self-pay

## 2015-09-15 NOTE — Telephone Encounter (Signed)
Patient called c/o Bell's Palsy symptoms x 4 months with no improvement. Patient is requesting physical therapy or any recommendations to help improve his symptoms   Please advise

## 2015-09-17 NOTE — Telephone Encounter (Signed)
I'm not sure that physical therapy has anything to offer in this situation. Perhaps he would like to see a neurologist to see if there are any other ideas that would be helpful. If patient is agreeable, please place the consultation request.

## 2015-09-17 NOTE — Telephone Encounter (Signed)
I will forward to in-office Medical Assistant to address with patient

## 2015-09-18 ENCOUNTER — Other Ambulatory Visit: Payer: Self-pay | Admitting: Internal Medicine

## 2015-09-19 NOTE — Telephone Encounter (Signed)
Spoke with patient. Patient will call if he would like to pursue referral

## 2015-10-27 ENCOUNTER — Other Ambulatory Visit: Payer: Self-pay | Admitting: Internal Medicine

## 2015-10-31 ENCOUNTER — Encounter: Payer: Self-pay | Admitting: *Deleted

## 2015-10-31 DIAGNOSIS — H5203 Hypermetropia, bilateral: Secondary | ICD-10-CM | POA: Diagnosis not present

## 2015-10-31 DIAGNOSIS — H2513 Age-related nuclear cataract, bilateral: Secondary | ICD-10-CM | POA: Diagnosis not present

## 2015-10-31 DIAGNOSIS — G510 Bell's palsy: Secondary | ICD-10-CM | POA: Diagnosis not present

## 2015-10-31 DIAGNOSIS — E119 Type 2 diabetes mellitus without complications: Secondary | ICD-10-CM | POA: Diagnosis not present

## 2015-10-31 LAB — HM DIABETES EYE EXAM

## 2015-11-04 ENCOUNTER — Encounter: Payer: Self-pay | Admitting: *Deleted

## 2015-11-05 NOTE — Addendum Note (Signed)
Addended by: Moshe Cipro Janice Seales A on: 11/05/2015 08:59 AM   Modules accepted: Orders

## 2015-11-07 ENCOUNTER — Other Ambulatory Visit: Payer: Self-pay | Admitting: Internal Medicine

## 2015-11-19 ENCOUNTER — Other Ambulatory Visit: Payer: Self-pay | Admitting: Internal Medicine

## 2015-11-25 ENCOUNTER — Other Ambulatory Visit: Payer: Self-pay | Admitting: Nurse Practitioner

## 2015-11-26 ENCOUNTER — Other Ambulatory Visit: Payer: Self-pay | Admitting: *Deleted

## 2015-11-26 MED ORDER — TEMAZEPAM 15 MG PO CAPS: ORAL_CAPSULE | ORAL | 0 refills | Status: DC

## 2015-11-26 NOTE — Telephone Encounter (Signed)
[facility] called and requested.

## 2015-12-12 ENCOUNTER — Other Ambulatory Visit: Payer: Medicare Other

## 2015-12-16 ENCOUNTER — Other Ambulatory Visit: Payer: Medicare Other

## 2015-12-16 ENCOUNTER — Ambulatory Visit: Payer: Medicare Other | Admitting: Internal Medicine

## 2015-12-16 DIAGNOSIS — I10 Essential (primary) hypertension: Secondary | ICD-10-CM | POA: Diagnosis not present

## 2015-12-16 DIAGNOSIS — E1122 Type 2 diabetes mellitus with diabetic chronic kidney disease: Secondary | ICD-10-CM

## 2015-12-16 DIAGNOSIS — N183 Chronic kidney disease, stage 3 unspecified: Secondary | ICD-10-CM

## 2015-12-16 DIAGNOSIS — E785 Hyperlipidemia, unspecified: Secondary | ICD-10-CM

## 2015-12-17 LAB — LIPID PANEL
CHOL/HDL RATIO: 3.2 ratio (ref <=5.0)
CHOLESTEROL: 157 mg/dL (ref 125–200)
HDL: 49 mg/dL (ref >=40)
LDL Cholesterol: 95 mg/dL (ref <130)
TRIGLYCERIDES: 65 mg/dL (ref <150)
VLDL: 13 mg/dL (ref <30)

## 2015-12-17 LAB — MICROALBUMIN, URINE: Microalb, Ur: 2.5 mg/dL

## 2015-12-17 LAB — COMPREHENSIVE METABOLIC PANEL
ALBUMIN: 3.8 g/dL (ref 3.6–5.1)
ALT: 11 U/L (ref 9–46)
AST: 16 U/L (ref 10–35)
Alkaline Phosphatase: 54 U/L (ref 40–115)
BILIRUBIN TOTAL: 0.6 mg/dL (ref 0.2–1.2)
BUN: 28 mg/dL — AB (ref 7–25)
CHLORIDE: 106 mmol/L (ref 98–110)
CO2: 24 mmol/L (ref 20–31)
CREATININE: 1.12 mg/dL — AB (ref 0.70–1.11)
Calcium: 9.1 mg/dL (ref 8.6–10.3)
Glucose, Bld: 91 mg/dL (ref 65–99)
Potassium: 4.3 mmol/L (ref 3.5–5.3)
Sodium: 140 mmol/L (ref 135–146)
TOTAL PROTEIN: 6.1 g/dL (ref 6.1–8.1)

## 2015-12-17 LAB — HEMOGLOBIN A1C
HEMOGLOBIN A1C: 5.8 % — AB (ref <5.7)
MEAN PLASMA GLUCOSE: 120 mg/dL

## 2015-12-24 ENCOUNTER — Ambulatory Visit (INDEPENDENT_AMBULATORY_CARE_PROVIDER_SITE_OTHER): Payer: Medicare Other | Admitting: Internal Medicine

## 2015-12-24 ENCOUNTER — Encounter: Payer: Self-pay | Admitting: Internal Medicine

## 2015-12-24 VITALS — BP 124/68 | HR 54 | Temp 97.6°F | Ht 65.0 in | Wt 161.0 lb

## 2015-12-24 DIAGNOSIS — E039 Hypothyroidism, unspecified: Secondary | ICD-10-CM | POA: Diagnosis not present

## 2015-12-24 DIAGNOSIS — E785 Hyperlipidemia, unspecified: Secondary | ICD-10-CM

## 2015-12-24 DIAGNOSIS — I10 Essential (primary) hypertension: Secondary | ICD-10-CM | POA: Diagnosis not present

## 2015-12-24 DIAGNOSIS — E1122 Type 2 diabetes mellitus with diabetic chronic kidney disease: Secondary | ICD-10-CM

## 2015-12-24 DIAGNOSIS — N183 Chronic kidney disease, stage 3 unspecified: Secondary | ICD-10-CM

## 2015-12-24 DIAGNOSIS — G510 Bell's palsy: Secondary | ICD-10-CM | POA: Diagnosis not present

## 2015-12-24 DIAGNOSIS — Z23 Encounter for immunization: Secondary | ICD-10-CM

## 2015-12-24 NOTE — Progress Notes (Signed)
Facility  [facility]    Place of Service:   OFFICE    Allergies  Allergen Reactions  . Fioricet [Butalbital-Apap-Caffeine]     Unaware of this allergy per patient.   . Other Other (See Comments)    Profen II DM= urinary outlet obstruction Unaware of this allergy per patient.  [name] [Guaifenesin]     Unaware of this allergy per patient.    Chief Complaint  Patient presents with  . Medical Management of Chronic Issues    4 month medication management blood pressure, blood sugar, thyroid    HPI:  Bell's palsy - slight if any improvement. Has quit biting cheek when he chews.  Essential hypertension - controlled  Hypothyroidism, unspecified type - compensated  Type 2 diabetes mellitus with stage 3 chronic kidney disease, without long-term current use of insulin (HCC) - stable  Hyperlipidemia, unspecified hyperlipidemia type - controlled    Medications: Patient's Medications  New Prescriptions   No medications on file  Previous Medications   ALPRAZOLAM (XANAX) 0.25 MG TABLET    TAKE 1 TABLET BY MOUTH UP TO THREE TIMES DAILY FOR ANXIETY   ASPIRIN 81 MG TABLET    Take 81 mg by mouth daily.   BLOOD GLUCOSE MONITORING SUPPL (ONE TOUCH ULTRA 2) W/DEVICE KIT    Check blood sugar once daily as directed DX 250.00   CARBOXYMETHYLCELLUL-GLYCERIN (REFRESH OPTIVE OP)    Apply to eye. One drop left eye every hour   CHOLECALCIFEROL (VITAMIN D PO)    Take 1,000 Units by mouth daily.    ERYTHROMYCIN OPHTHALMIC OINTMENT    Place 1 application into the left eye 4 (four) times daily.   GLIMEPIRIDE (AMARYL) 1 MG TABLET    TAKE 1 TABLET BY MOUTH ONCE DAILY   LEVOTHYROXINE (SYNTHROID, LEVOTHROID) 50 MCG TABLET    TAKE 1 TABLET DAILY FOR THYROID SUPPLEMENT.   LOSARTAN-HYDROCHLOROTHIAZIDE (HYZAAR) 100-25 MG TABLET    1/2 by mouth daily for blood pressure   NITROGLYCERIN (NITROSTAT) 0.4 MG SL TABLET    Place 1 tablet (0.4 mg total) under the tongue every 5 (five) minutes as needed for chest  pain.   ONE TOUCH ULTRA TEST TEST STRIP    CHECK BLOOD SUGAR ONCE DAILY AS DIRECTED   ONETOUCH DELICA LANCETS 56L MISC    CHECK BLOOD SUGAR ONCE DAILY AS DIRECTED   SIMVASTATIN (ZOCOR) 20 MG TABLET    TAKE 1 TABLET BY MOUTH EVERY NIGHT AT BEDTIME   TEMAZEPAM (RESTORIL) 15 MG CAPSULE    Take one capsule by mouth at bedtime as needed for sleep   TRIAMCINOLONE CREAM (KENALOG) 0.1 %    Apply topically 2 (two) times daily. Apply to rash as needed.  Modified Medications   No medications on file  Discontinued Medications   No medications on file    Review of Systems  Constitutional: Negative.  Negative for fatigue, fever and unexpected weight change.  HENT:       Left facial droop onset 05/03/2015.  Eyes: Positive for visual disturbance (corrective lenses).  Respiratory: Negative for cough and shortness of breath.   Cardiovascular: Positive for chest pain (tightness). Negative for palpitations and leg swelling.  Gastrointestinal: Negative for constipation and diarrhea.  Endocrine:       Diabetic.  Genitourinary: Negative.   Musculoskeletal: Positive for arthralgias. Negative for back pain, joint swelling and myalgias.       Unsteady gait. Denies arthritis. Chronic discomfort in the right hip area.  Skin: Negative.  Allergic/Immunologic: Negative.   Neurological: Positive for dizziness. Negative for weakness and headaches.       Bell's pa;lsy with left facial droop. Some difficulty with balance. Has a slightly unsteady gait.  Hematological: Negative.   Psychiatric/Behavioral:       Chronic mild anxiety. Worries about his wife. Reports recent irritability    Vitals:   12/24/15 1552  BP: 124/68  Pulse: (!) 54  Temp: 97.6 F (36.4 C)  TempSrc: Oral  SpO2: 95%  Weight: 161 lb (73 kg)  Height: 5' 5"  (1.651 m)   Body mass index is 26.79 kg/m. Wt Readings from Last 3 Encounters:  12/24/15 161 lb (73 kg)  07/30/15 162 lb (73.5 kg)  06/03/15 165 lb 3.2 oz (74.9 kg)       Physical Exam  Constitutional: He is oriented to person, place, and time. He appears well-developed and well-nourished. No distress.  Mildly overweight.  HENT:  Head: Normocephalic and atraumatic.  Right Ear: External ear normal.  Left Ear: External ear normal.  Nose: Nose normal.  Mouth/Throat: Oropharynx is clear and moist.  Eyes: Conjunctivae and EOM are normal. Pupils are equal, round, and reactive to light.  Wears corrective lenses. Reduced visual acuity bilaterally.  Neck: Neck supple. No JVD present. No tracheal deviation present. No thyromegaly present.  Cardiovascular: Regular rhythm, normal heart sounds and intact distal pulses.  Exam reveals no gallop and no friction rub.   No murmur heard. Pulmonary/Chest: Breath sounds normal. No respiratory distress. He has no wheezes. He has no rales. He exhibits no tenderness.  Abdominal: Bowel sounds are normal. He exhibits no distension and no mass. There is no tenderness.  Musculoskeletal: Normal range of motion. He exhibits no edema.  Tenderness right hip. Mild gait instability.  Lymphadenopathy:    He has no cervical adenopathy.  Neurological: He is alert and oriented to person, place, and time. No cranial nerve deficit. Coordination normal.  Normal response to vibratory testing. Severe left facial drooping   Skin: No rash noted. No erythema. No pallor.  Psychiatric: He has a normal mood and affect. His behavior is normal. Judgment and thought content normal.    Labs reviewed: Lab Summary Latest Ref Rng & Units 12/16/2015 07/25/2015 03/28/2015  Hemoglobin 13.0-17.0 g/dL (None) (None) (None)  Hematocrit 39.0-52.0 % (None) (None) (None)  White count - (None) (None) (None)  Platelet count - (None) (None) (None)  Sodium 135 - 146 mmol/L 140 140 142  Potassium 3.5 - 5.3 mmol/L 4.3 4.1 4.1  Calcium 8.6 - 10.3 mg/dL 9.1 9.6 9.0  Phosphorus - (None) (None) (None)  Creatinine 0.70 - 1.11 mg/dL 1.12(H) 1.13 1.18  AST 10 - 35  U/L 16 18 (None)  Alk Phos 40 - 115 U/L 54 63 (None)  Bilirubin 0.2 - 1.2 mg/dL 0.6 0.6 (None)  Glucose 65 - 99 mg/dL 91 82 87  Cholesterol 125 - 200 mg/dL 157 (None) (None)  HDL cholesterol >=40 mg/dL 49 54 50  Triglycerides <150 mg/dL 65 76 101  LDL Direct - (None) (None) (None)  LDL Calc <130 mg/dL 95 89 85  Total protein 6.1 - 8.1 g/dL 6.1 (None) (None)  Albumin 3.6 - 5.1 g/dL 3.8 4.2 (None)  Some recent data might be hidden   Lab Results  Component Value Date   TSH 4.150 07/25/2015   TSH 3.970 03/28/2015   TSH 3.320 05/15/2014   Lab Results  Component Value Date   BUN 28 (H) 12/16/2015   BUN 20 07/25/2015  BUN 20 03/28/2015   Lab Results  Component Value Date   HGBA1C 5.8 (H) 12/16/2015   HGBA1C 6.2 (H) 07/25/2015   HGBA1C 6.2 (H) 03/28/2015    Assessment/Plan  1. Bell's palsy unchanged  2. Essential hypertension controlled  3. Hypothyroidism, unspecified type - TSH; Future  4. Type 2 diabetes mellitus with stage 3 chronic kidney disease, without long-term current use of insulin (HCC) - Hemoglobin A1c; Future - Basic metabolic panel; Future  5. Hyperlipidemia, unspecified hyperlipidemia type - Lipid panel; Future  6. Encounter for immunization - Flu Vaccine QUAD 36+ mos IM

## 2016-01-20 DIAGNOSIS — H02834 Dermatochalasis of left upper eyelid: Secondary | ICD-10-CM | POA: Diagnosis not present

## 2016-01-20 DIAGNOSIS — H02132 Senile ectropion of right lower eyelid: Secondary | ICD-10-CM | POA: Diagnosis not present

## 2016-01-20 DIAGNOSIS — H02831 Dermatochalasis of right upper eyelid: Secondary | ICD-10-CM | POA: Diagnosis not present

## 2016-01-20 DIAGNOSIS — H02135 Senile ectropion of left lower eyelid: Secondary | ICD-10-CM | POA: Diagnosis not present

## 2016-01-20 DIAGNOSIS — G510 Bell's palsy: Secondary | ICD-10-CM | POA: Diagnosis not present

## 2016-02-06 ENCOUNTER — Other Ambulatory Visit: Payer: Self-pay | Admitting: Internal Medicine

## 2016-02-11 ENCOUNTER — Other Ambulatory Visit: Payer: Self-pay

## 2016-02-11 MED ORDER — LOSARTAN POTASSIUM-HCTZ 100-25 MG PO TABS: ORAL_TABLET | ORAL | 3 refills | Status: DC

## 2016-03-16 ENCOUNTER — Ambulatory Visit: Payer: Medicare Other

## 2016-03-16 ENCOUNTER — Other Ambulatory Visit: Payer: Self-pay | Admitting: *Deleted

## 2016-03-16 MED ORDER — TEMAZEPAM 15 MG PO CAPS: ORAL_CAPSULE | ORAL | 0 refills | Status: DC

## 2016-03-16 MED ORDER — ALPRAZOLAM 0.25 MG PO TABS: ORAL_TABLET | ORAL | 1 refills | Status: DC

## 2016-03-16 NOTE — Telephone Encounter (Signed)
[facility] 

## 2016-03-23 ENCOUNTER — Ambulatory Visit (INDEPENDENT_AMBULATORY_CARE_PROVIDER_SITE_OTHER): Payer: Medicare Other

## 2016-03-23 VITALS — BP 142/76 | HR 57 | Temp 97.9°F | Ht 65.0 in | Wt 165.4 lb

## 2016-03-23 DIAGNOSIS — Z0000 Encounter for general adult medical examination without abnormal findings: Secondary | ICD-10-CM

## 2016-03-23 DIAGNOSIS — Z23 Encounter for immunization: Secondary | ICD-10-CM | POA: Diagnosis not present

## 2016-03-23 NOTE — Progress Notes (Signed)
Quick Notes   Health Maintenance:  Pn13 given today; pt due for foot exam.     Abnormal Screen: None; Last MMSE done 07/2015.     Patient Concerns:  Pt is still very concerned about his Bell's Palsy. He stated his son (physician in [place]) thinks it could be [place] and would like your thoughts on this. Pt would also like to know if you think it would be beneficial for him to see a Neurologist and/or Infectious Disease Specialist. If so, who would you recommend?    Nurse Concerns:  None  I have reviewed the information entered by the Health Advisor. I was present in the office during the time of patient interaction and was available for consultation. I agree with the documentation and advice.  Viviann Spare Nyoka Cowden, MD

## 2016-03-23 NOTE — Progress Notes (Signed)
Subjective:   Bobby Jensen is a 83 y.o. male who presents for an Initial Medicare Annual Wellness Visit.  Review of Systems   Cardiac Risk Factors include: advanced age (>96mn, >>54women);diabetes mellitus;hypertension;male gender;family history of premature cardiovascular disease;smoking/ tobacco exposure    Objective:    Today's Vitals   03/23/16 1348  BP: (!) 142/76  Pulse: (!) 57  Temp: 97.9 F (36.6 C)  TempSrc: Oral  SpO2: 98%  Weight: 165 lb 6.4 oz (75 kg)  Height: 5' 5"  (1.651 m)   Body mass index is 27.52 kg/m.  Current Medications (verified) Outpatient Encounter Prescriptions as of 03/23/2016  Medication Sig  . ALPRAZolam (XANAX) 0.25 MG tablet Take one tablet by mouth up to three times daily for anxiety  . aspirin 81 MG tablet Take 81 mg by mouth daily.  . Blood Glucose Monitoring Suppl (ONE TOUCH ULTRA 2) W/DEVICE KIT Check blood sugar once daily as directed DX 250.00  . Carboxymethylcellul-Glycerin (REFRESH OPTIVE OP) Apply to eye. One drop left eye every hour  . Cholecalciferol (VITAMIN D PO) Take 1,000 Units by mouth daily.   .Marland Kitchenglimepiride (AMARYL) 1 MG tablet TAKE 1 TABLET BY MOUTH ONCE DAILY  . levothyroxine (SYNTHROID, LEVOTHROID) 50 MCG tablet TAKE 1 TABLET BY MOUTH EVERY DAY FOR THYROID SUPPLEMENT.  .Marland Kitchenlosartan-hydrochlorothiazide (HYZAAR) 100-25 MG tablet 1/2 by mouth daily for blood pressure  . nitroGLYCERIN (NITROSTAT) 0.4 MG SL tablet Place 1 tablet (0.4 mg total) under the tongue every 5 (five) minutes as needed for chest pain.  . ONE TOUCH ULTRA TEST test strip CHECK BLOOD SUGAR ONCE DAILY AS DIRECTED  . ONETOUCH DELICA LANCETS 315BMISC CHECK BLOOD SUGAR ONCE DAILY AS DIRECTED  . simvastatin (ZOCOR) 20 MG tablet TAKE 1 TABLET BY MOUTH EVERY NIGHT AT BEDTIME  . temazepam (RESTORIL) 15 MG capsule Take one capsule by mouth at bedtime as needed for sleep  . triamcinolone cream (KENALOG) 0.1 % Apply topically 2 (two) times daily. Apply to rash as  needed.  . [DISCONTINUED] erythromycin ophthalmic ointment Place 1 application into the left eye 4 (four) times daily.   No facility-administered encounter medications on file as of 03/23/2016.     Allergies (verified) Fioricet [butalbital-apap-caffeine]; Other; and Tussin [guaifenesin]   History: Past Medical History:  Diagnosis Date  . Bladder neck obstruction 03/28/2008  . Contact with or exposure to tuberculosis 03/15/1992  . Elevated prostate specific antigen (PSA) 03/15/1998  . Gastric ulcer, unspecified as acute or chronic, without mention of hemorrhage, perforation, or obstruction 03/15/1993  . Hypertension 07/08/1998  . Hypertrophy of prostate without urinary obstruction and other lower urinary tract symptoms (LUTS) 03/15/1989  . 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 Inguinal hernia without mention of obstruction or gangrene, unilateral or unspecified, (not specified as recurrent) 11/20/2008  . Insomnia, unspecified 11/01/2003  . Kidney calculi 08/20/2009  . Other and unspecified hyperlipidemia 12/23/2004  . Other malaise and fatigue 12/23/2004  . Pain in joint, pelvic region and thigh 12/17/2009  . Pain in joint, shoulder region 11/01/2003  . Peptic ulcer, unspecified site, unspecified as acute or chronic, without mention of hemorrhage, perforation, or obstruction 06/25/2003  . Personal history of malaria 03/16/1939  . Rosacea 08/20/2009  . Type II or unspecified type diabetes mellitus without mention of complication, not stated as uncontrolled 07/15/2006  . Unspecified constipation 08/05/2010  . Unspecified hypothyroidism 12/17/2009   Past Surgical History:  Procedure Laterality Date  . APPENDECTOMY  1956  . HEMORROIDECTOMY  06/2002  Dr. Lennie Hummer  . HERNIA REPAIR Bilateral 11/2008   inguinal  . PILONIDAL CYST EXCISION  1963  . PROSTATE SURGERY    . TOTAL HIP ARTHROPLASTY Right 07/10/2009   Dr. Telford Nab   Family History  Problem Relation Age of Onset  .  Heart disease Father    Social History   Occupational History  . Not on file.   Social History Main Topics  . Smoking status: Former Smoker    Quit date: 07/31/1978  . Smokeless tobacco: Never Used  . Alcohol use Yes     Comment: Occ. glass of wine  . Drug use: No  . Sexual activity: Yes    Partners: Female   Tobacco Counseling Counseling given: No   Activities of Daily Living In your present state of health, do you have any difficulty performing the following activities: 03/23/2016  Hearing? N  Vision? N  Difficulty concentrating or making decisions? N  Walking or climbing stairs? N  Dressing or bathing? N  Doing errands, shopping? N  Preparing Food and eating ? N  Using the Toilet? N  In the past six months, have you accidently leaked urine? N  Do you have problems with loss of bowel control? N  Managing your Medications? N  Managing your Finances? N  Housekeeping or managing your Housekeeping? N  Some recent data might be hidden    Immunizations and Health Maintenance Immunization History  Administered Date(s) Administered  . Influenza Split 12/28/2011  . Influenza,inj,Quad PF,36+ Mos 04/01/2015, 12/24/2015  . Influenza-Unspecified 01/14/2013, 12/10/2013, 12/03/2014  . Pneumococcal Conjugate-13 03/23/2016  . Pneumococcal-Unspecified 02/15/1992  . Td 03/15/1990  . Tdap 03/05/2013   Health Maintenance Due  Topic Date Due  . FOOT EXAM  05/22/2015    Patient Care Team: Estill Dooms, MD as PCP - General [name], MD as Consulting Physician (Ophthalmology)  Indicate any recent Medical Services you may have received from other than [facility] providers in the past year (date may be approximate).    Assessment:   This is a routine wellness examination for Bobby Jensen.   Hearing/Vision screen  Hearing Screening   125Hz  250Hz  500Hz  1000Hz  2000Hz  3000Hz  4000Hz  6000Hz  8000Hz   Right ear:   100 100 100  0    Left ear:   100 100 100  0    Comments: Pt has not had  a hearing screen in many years. Denies any hearing concerns.   Vision Screening Comments: Last eye exam done in 2017 with Dr. Eleanora Neighbor. Due to pt having Bell's Palsy and the effects on his left eye. Pt concerned about the amount of tears he is now producing; when he talks or eats.   Dietary issues and exercise activities discussed: Current Exercise Habits: Home exercise routine, Type of exercise: walking;stretching, Time (Minutes): 20, Frequency (Times/Week): 7, Weekly Exercise (Minutes/Week): 140, Intensity: Mild, Exercise limited by: None identified  Goals    . <enter goal here>          Starting 03/23/2016, I will maintain my current lifestyle.       Depression Screen PHQ 2/9 Scores 03/23/2016 12/24/2015 09/18/2014 05/23/2014  PHQ - 2 Score 0 0 0 0    Fall Risk Fall Risk  03/23/2016 12/24/2015 05/06/2015 04/01/2015 09/18/2014  Falls in the past year? No No No No No    Cognitive Function: MMSE - Mini Mental State Exam 03/23/2016 07/30/2015  Not completed: (No Data) -  Orientation to time - 5  Orientation to Place - 5  Registration - 3  Attention/ Calculation - 5  Recall - 2  Language- name 2 objects - 2  Language- repeat - 1  Language- follow 3 step command - 3  Language- read & follow direction - 1  Write a sentence - 1  Copy design - 1  Total score - 29        Screening Tests Health Maintenance  Topic Date Due  . FOOT EXAM  05/22/2015  . ZOSTAVAX  03/15/2019 (Originally 10/20/1989)  . HEMOGLOBIN A1C  06/15/2016  . OPHTHALMOLOGY EXAM  10/30/2016  . PNA vac Low Risk Adult (2 of 2 - PPSV23) 03/23/2017  . TETANUS/TDAP  03/06/2023  . INFLUENZA VACCINE  Completed        Plan:    I have personally reviewed and addressed the Medicare Annual Wellness questionnaire and have noted the following in the patient's chart:  A. Medical and social history B. Use of alcohol, tobacco or illicit drugs  C. Current medications and supplements D. Functional ability and status E.    Nutritional status F.  Physical activity G. Advance directives H. List of other physicians I.  Hospitalizations, surgeries, and ER visits in previous 12 months J.  [place] to include hearing, vision, cognitive, depression L. Referrals and appointments - none  In addition, I have reviewed and discussed with patient certain preventive protocols, quality metrics, and best practice recommendations. A written personalized care plan for preventive services as well as general preventive health recommendations were provided to patient.  See attached scanned questionnaire for additional information.   Signed,   Allyn Kenner, LPN Health Advisor   I have reviewed the information entered by the Health Advisor. I was present in the office during the time of patient interaction and was available for consultation. I agree with the documentation and advice.  Viviann Spare Nyoka Cowden, MD

## 2016-03-23 NOTE — Patient Instructions (Addendum)
Bobby Jensen , Thank you for taking time to come for your Medicare Wellness Visit. I appreciate your ongoing commitment to your health goals. Please review the following plan we discussed and let me know if I can assist you in the future.   These are the goals we discussed: Goals    . <enter goal here>          Starting 03/23/2016, I will maintain my current lifestyle.        This is a list of the screening recommended for you and due dates:  Health Maintenance  Topic Date Due  . Complete foot exam   05/22/2015  . Shingles Vaccine  03/15/2019*  . Hemoglobin A1C  06/15/2016  . Eye exam for diabetics  10/30/2016  . Pneumonia vaccines (2 of 2 - PPSV23) 03/23/2017  . Tetanus Vaccine  03/06/2023  . Flu Shot  Completed  *Topic was postponed. The date shown is not the original due date.  Preventive Care for Adults  A healthy lifestyle and preventive care can promote health and wellness. Preventive health guidelines for adults include the following key practices.  . A routine yearly physical is a good way to check with your health care provider about your health and preventive screening. It is a chance to share any concerns and updates on your health and to receive a thorough exam.  . Visit your dentist for a routine exam and preventive care every 6 months. Brush your teeth twice a day and floss once a day. Good oral hygiene prevents tooth decay and gum disease.  . The frequency of eye exams is based on your age, health, family medical history, use  of contact lenses, and other factors. Follow your health care provider's ecommendations for frequency of eye exams.  . Eat a healthy diet. Foods like vegetables, fruits, whole grains, low-fat dairy products, and lean protein foods contain the nutrients you need without too many calories. Decrease your intake of foods high in solid fats, added sugars, and salt. Eat the right amount of calories for you. Get information about a proper diet from your  health care provider, if necessary.  . Regular physical exercise is one of the most important things you can do for your health. Most adults should get at least 150 minutes of moderate-intensity exercise (any activity that increases your heart rate and causes you to sweat) each week. In addition, most adults need muscle-strengthening exercises on 2 or more days a week.  Silver Sneakers may be a benefit available to you. To determine eligibility, you may visit the website: www.silversneakers.com or contact program at 757-329-7178 Mon-Fri between 8AM-8PM.   . Maintain a healthy weight. The body mass index (BMI) is a screening tool to identify possible weight problems. It provides an estimate of body fat based on height and weight. Your health care provider can find your BMI and can help you achieve or maintain a healthy weight.   For adults 20 years and older: ? A BMI below 18.5 is considered underweight. ? A BMI of 18.5 to 24.9 is normal. ? A BMI of 25 to 29.9 is considered overweight. ? A BMI of 30 and above is considered obese.   . Maintain normal blood lipids and cholesterol levels by exercising and minimizing your intake of saturated fat. Eat a balanced diet with plenty of fruit and vegetables. Blood tests for lipids and cholesterol should begin at age 53 and be repeated every 5 years. If your lipid or cholesterol levels  are high, you are over 50, or you are at high risk for heart disease, you may need your cholesterol levels checked more frequently. Ongoing high lipid and cholesterol levels should be treated with medicines if diet and exercise are not working.  . If you smoke, find out from your health care provider how to quit. If you do not use tobacco, please do not start.  . If you choose to drink alcohol, please do not consume more than 2 drinks per day. One drink is considered to be 12 ounces (355 mL) of beer, 5 ounces (148 mL) of wine, or 1.5 ounces (44 mL) of liquor.  . If you are  82-10 years old, ask your health care provider if you should take aspirin to prevent strokes.  . Use sunscreen. Apply sunscreen liberally and repeatedly throughout the day. You should seek shade when your shadow is shorter than you. Protect yourself by wearing long sleeves, pants, a wide-brimmed hat, and sunglasses year round, whenever you are outdoors.  . Once a month, do a whole body skin exam, using a mirror to look at the skin on your back. Tell your health care provider of new moles, moles that have irregular borders, moles that are larger than a pencil eraser, or moles that have changed in shape or color.

## 2016-03-30 ENCOUNTER — Other Ambulatory Visit: Payer: Self-pay | Admitting: Internal Medicine

## 2016-03-30 DIAGNOSIS — G510 Bell's palsy: Secondary | ICD-10-CM

## 2016-05-10 ENCOUNTER — Ambulatory Visit: Payer: Medicare Other | Admitting: Cardiology

## 2016-05-12 ENCOUNTER — Encounter: Payer: Self-pay | Admitting: Cardiology

## 2016-05-29 ENCOUNTER — Other Ambulatory Visit: Payer: Self-pay | Admitting: Internal Medicine

## 2016-06-01 ENCOUNTER — Other Ambulatory Visit: Payer: Self-pay | Admitting: Nurse Practitioner

## 2016-06-04 ENCOUNTER — Encounter: Payer: Self-pay | Admitting: Neurology

## 2016-06-04 ENCOUNTER — Ambulatory Visit (INDEPENDENT_AMBULATORY_CARE_PROVIDER_SITE_OTHER): Payer: Medicare Other | Admitting: Neurology

## 2016-06-04 VITALS — BP 124/72 | HR 52 | Ht 65.0 in | Wt 159.4 lb

## 2016-06-04 DIAGNOSIS — G510 Bell's palsy: Secondary | ICD-10-CM

## 2016-06-04 NOTE — Progress Notes (Signed)
NEUROLOGY CONSULTATION NOTE  Bobby Jensen MRN: [id number] DOB: 06/11/1927  Referring provider: Dr. Jeanmarie Hubert Primary care provider: Dr. Jeanmarie Hubert  Reason for consult:  Bell's palsy  Dear Dr Nyoka Cowden:  Thank you for your kind referral of Bobby Jensen for consultation of the above symptoms. Although his history is well known to you, please allow me to reiterate it for the purpose of our medical record. Records and images were personally reviewed where available.  HISTORY OF PRESENT ILLNESS: This is a very pleasant 83 year old right-handed man with a history of hypertension, hyperlipidemia, diabetes, presenting for evaluation of left facial weakness. He reports that symptoms started on 05/03/2015, he started having symptoms after waking up with irritation of the left eye, he kept biting inside his left cheek. He went to the ER where he was diagnosed with Bell's palsy and discharged home with a 5-day course of Prednisone. He provides additional information today that around 5-6 days before the Bell's palsy, he had blisters in his left ear. He denied any pain. He has seen ophthalmology and has had tarsorrhaphy done 2 or 3 times and has eye drops. He is frustrated that he has not seen much improvement, and 4 months later, he feels that "things have switched direction." He feels the right side of his face is droopy and has tried taping it up. He continues to have hyperlacrimation, and now has lacrimation when he eats or talks. He is frustrated he cannot talk to anybody without crying. He has noticed some decreased hearing in his left ear. He has occasional dizziness. He denies any headaches, diplopia, dysarthria/dysphagia, neck/back pain, focal numbness/tingling/weakness, bowel/bladder dysfunction. He denies any facial numbness, paresthesias, or pain. He denies any prior history of Bell's palsy.  Laboratory Data: Lab Results  Component Value Date   WBC 7.1 07/14/2009   HGB 16.0  07/31/2011   HCT 47.0 07/31/2011   MCV 91.2 07/14/2009   PLT 164 REPEATED TO VERIFY DELTA CHECK NOTED 07/14/2009     Chemistry      Component Value Date/Time   NA 140 12/16/2015 0805   NA 140 07/25/2015 0825   K 4.3 12/16/2015 0805   CL 106 12/16/2015 0805   CO2 24 12/16/2015 0805   BUN 28 (H) 12/16/2015 0805   BUN 20 07/25/2015 0825   CREATININE 1.12 (H) 12/16/2015 0805      Component Value Date/Time   CALCIUM 9.1 12/16/2015 0805   ALKPHOS 54 12/16/2015 0805   AST 16 12/16/2015 0805   ALT 11 12/16/2015 0805   BILITOT 0.6 12/16/2015 0805   BILITOT 0.6 07/25/2015 0825     Lab Results  Component Value Date   HGBA1C 5.8 (H) 12/16/2015     PAST MEDICAL HISTORY: Past Medical History:  Diagnosis Date  . Bladder neck obstruction 03/28/2008  . Contact with or exposure to tuberculosis 03/15/1992  . Elevated prostate specific antigen (PSA) 03/15/1998  . Gastric ulcer, unspecified as acute or chronic, without mention of hemorrhage, perforation, or obstruction 03/15/1993  . Hypertension 07/08/1998  . Hypertrophy of prostate without urinary obstruction and other lower urinary tract symptoms (LUTS) 03/15/1989  . 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 Inguinal hernia without mention of obstruction or gangrene, unilateral or unspecified, (not specified as recurrent) 11/20/2008  . Insomnia, unspecified 11/01/2003  . Kidney calculi 08/20/2009  . Other and unspecified hyperlipidemia 12/23/2004  . Other malaise and fatigue 12/23/2004  . Pain in joint, pelvic region and thigh 12/17/2009  .  Pain in joint, shoulder region 11/01/2003  . Peptic ulcer, unspecified site, unspecified as acute or chronic, without mention of hemorrhage, perforation, or obstruction 06/25/2003  . Personal history of malaria 03/16/1939  . Rosacea 08/20/2009  . Type II or unspecified type diabetes mellitus without mention of complication, not stated as uncontrolled 07/15/2006  . Unspecified constipation  08/05/2010  . Unspecified hypothyroidism 12/17/2009    PAST SURGICAL HISTORY: Past Surgical History:  Procedure Laterality Date  . APPENDECTOMY  1956  . HEMORROIDECTOMY  06/2002   Dr. Lennie Hummer  . HERNIA REPAIR Bilateral 11/2008   inguinal  . PILONIDAL CYST EXCISION  1963  . PROSTATE SURGERY    . TOTAL HIP ARTHROPLASTY Right 07/10/2009   Dr. Telford Nab    MEDICATIONS: Current Outpatient Prescriptions on File Prior to Visit  Medication Sig Dispense Refill  . ALPRAZolam (XANAX) 0.25 MG tablet Take one tablet by mouth up to three times daily for anxiety 90 tablet 1  . aspirin 81 MG tablet Take 81 mg by mouth daily.    . Blood Glucose Monitoring Suppl (ONE TOUCH ULTRA 2) W/DEVICE KIT Check blood sugar once daily as directed DX 250.00 1 each 0  . Carboxymethylcellul-Glycerin (REFRESH OPTIVE OP) Apply to eye. One drop left eye every hour    . Cholecalciferol (VITAMIN D PO) Take 1,000 Units by mouth daily.     Marland Kitchen glimepiride (AMARYL) 1 MG tablet TAKE 1 TABLET BY MOUTH ONCE DAILY 90 tablet 0  . levothyroxine (SYNTHROID, LEVOTHROID) 50 MCG tablet TAKE 1 TABLET BY MOUTH EVERY DAY FOR THYROID SUPPLEMENT. 90 tablet 1  . losartan-hydrochlorothiazide (HYZAAR) 100-25 MG tablet 1/2 by mouth daily for blood pressure 45 tablet 3  . nitroGLYCERIN (NITROSTAT) 0.4 MG SL tablet Place 1 tablet (0.4 mg total) under the tongue every 5 (five) minutes as needed for chest pain. 90 tablet 3  . ONE TOUCH ULTRA TEST test strip CHECK BLOOD SUGAR ONCE DAILY AS DIRECTED. 100 each 3  . ONETOUCH DELICA LANCETS 29J MISC CHECK BLOOD SUGAR ONCE DAILY AS DIRECTED 100 each 3  . simvastatin (ZOCOR) 20 MG tablet TAKE 1 TABLET BY MOUTH EVERY NIGHT AT BEDTIME 120 tablet 1  . temazepam (RESTORIL) 15 MG capsule TAKE ONE CAPSULE BY MOUTH AT BEDTIME AS NEEDED FOR SLEEP 90 capsule 0  . triamcinolone cream (KENALOG) 0.1 % Apply topically 2 (two) times daily. Apply to rash as needed. 30 g 3   No current facility-administered  medications on file prior to visit.     ALLERGIES: Allergies  Allergen Reactions  . Fioricet [Butalbital-Apap-Caffeine]     Unaware of this allergy per patient.   . Other Other (See Comments)    Profen II DM= urinary outlet obstruction Unaware of this allergy per patient.  [name] [Guaifenesin]     Unaware of this allergy per patient.    FAMILY HISTORY: Family History  Problem Relation Age of Onset  . Heart disease Father     SOCIAL HISTORY: Social History   Social History  . Marital status: Married    Spouse name: N/A  . Number of children: N/A  . Years of education: N/A   Occupational History  . Not on file.   Social History Main Topics  . Smoking status: Former Smoker    Quit date: 07/31/1978  . Smokeless tobacco: Never Used  . Alcohol use Yes     Comment: Occ. glass of wine  . Drug use: No  . Sexual activity: Yes    Partners:  Female   Other Topics Concern  . Not on file   Social History Narrative  . No narrative on file    REVIEW OF SYSTEMS: Constitutional: No fevers, chills, or sweats, no generalized fatigue, change in appetite Eyes: No visual changes, double vision, eye pain Ear, nose and throat: No hearing loss, ear pain, nasal congestion, sore throat Cardiovascular: No chest pain, palpitations Respiratory:  No shortness of breath at rest or with exertion, wheezes GastrointestinaI: No nausea, vomiting, diarrhea, abdominal pain, fecal incontinence Genitourinary:  No dysuria, urinary retention or frequency Musculoskeletal:  No neck pain, back pain Integumentary: No rash, pruritus, skin lesions Neurological: as above Psychiatric: No depression, insomnia, anxiety Endocrine: No palpitations, fatigue, diaphoresis, mood swings, change in appetite, change in weight, increased thirst Hematologic/Lymphatic:  No anemia, purpura, petechiae. Allergic/Immunologic: no itchy/runny eyes, nasal congestion, recent allergic reactions, rashes  PHYSICAL  EXAM: Vitals:   06/04/16 1342  BP: 124/72  Pulse: (!) 52   General: No acute distress Head:  Normocephalic/atraumatic, no vesicles in ear canals Eyes: Fundoscopic exam shows bilateral sharp discs, no vessel changes, exudates, or hemorrhages Neck: supple, no paraspinal tenderness, full range of motion Back: No paraspinal tenderness Heart: regular rate and rhythm Lungs: Clear to auscultation bilaterally. Vascular: No carotid bruits. Skin/Extremities: No rash, no edema Neurological Exam: Mental status: alert and oriented to person, place, and time, no dysarthria or aphasia, Fund of knowledge is appropriate.  Recent and remote memory are intact.  Attention and concentration are normal.    Able to name objects and repeat phrases. Cranial nerves: CN I: not tested CN II: pupils equal, round and reactive to light, visual fields intact, fundi unremarkable. CN III, IV, VI:  Restricted movement of the right eye laterally (?lateral rectus palsy), normal medial and vertical eye movements, normal eye movements on left eye. no nystagmus, no ptosis CN V: facial sensation intact CN VII: +facial asymmetry at rest with weakness of left frontalis, orbicularis oculi, and orbicularis oris (House-Brackmann scale IV, severe), with synkinesis (no abnormal movements but with hypertonic contracture of the orbicularis oris and hyperlacrimation) CN VIII: decreased finger rub on the left ear CN IX, X: gag intact, uvula midline CN XI: sternocleidomastoid and trapezius muscles intact CN XII: tongue midline Bulk & Tone: normal, no fasciculations. Motor: 5/5 throughout with no pronator drift. Sensation: intact to light touch, cold, pin, vibration and joint position sense.  No extinction to double simultaneous stimulation.  Romberg test negative Deep Tendon Reflexes: +2 throughout, no ankle clonus Plantar responses: downgoing bilaterally Cerebellar: no incoordination on finger to nose Gait: narrow-based and steady,  able to tandem walk adequately. Tremor: none  IMPRESSION: This is a pleasant 83 year old right-handed man with a history of hypertension, hyperlipidemia, diabetes, presenting with a 1-year history of left facial weakness consistent with Bell's palsy (severe). He now reports that 5-6 days prior to the Bell's palsy, he had blisters inside his left ear, raising the possibility of Ramsey-Hunt syndrome, which typically does have more severe facial paralysis. Since there has been no apparent recovery in >4 months, and with note of ?right lateral rectus palsy, an MRI brain with and without contrast will be ordered to assess for underlying structural abnormality. He now has synkinesis on the left side of his face, which makes him think that the right side is now affected, I explained to him the contracture on the left side is causing this apparent asymmetry. I gave him information about synkinesis, physical therapy (facial retraining therapy) may be helpful,  as well as Botox. We discussed the hyperlacrimation (crocodile tears), which is also a type of synkinesis, has been shown to respond to Botox directly into the lacrimal gland, I will ask if his ophthalmologist would be able to do this. He will follow-up in 4 months and knows to call for any changes.    Thank you for allowing me to participate in the care of this patient. Please do not hesitate to call for any questions or concerns.   Ellouise Newer, M.D.  CC: Dr. Nyoka Cowden

## 2016-06-04 NOTE — Patient Instructions (Addendum)
1. Schedule MRI brain with and without contrast 2. Refer to Physical Therapy for facial therapy after Bell's palsy 3. We will look into Botox and if surgery is an option 4. Follow-up in 4 months, call for any changes  Synkinesis refers to involuntary movements of the face - usually as a result of incomplete recovery from facial nerve damage (facial paralysis). Symptoms include: abnormal muscle contractions of the eye, mouth, mid-face and neck, tightness, rigidity and hyperlacrimation (watery eyes). Treatments aim at reducing synkinetic activity while preserving muscle tone.  The facial nerve (7'th cranial nerve) contains multiple (5-10.000) fine nerve cell extensions (called nerve fibres or axons). Each of these controls a sub-segment of one of more than 20 individual muscles in the face and neck. Facial nerve fibres are essential for voluntary and spontaneous facial expressions, social interaction (non-verbal-communication) and the function of mouth and eyelids.   Misdirection of regenerating nerve fibres If the facial nerve is damaged (Bell's palsy, trauma, infection, inflammation, tumours or surgery) it has the potential for self-repair. Biological systems inside the nerve will facilitate growth of nerve fibres as they regenerate towards their original muscle-segments in order to recover function. However, scar tissue or less ideal growth conditions, may divert outgrowing nerve fibres to incorrect targets (i.e. different muscle segments, different muscle groups, or the lacrimal gland).  All severe damage to the facial nerve results in initial facial paralysis, followed by synkinesis once muscle function is regained. Various symptom patterns exist; the most disabling being related to eye-closure, oral function, facial mass-movements, muscle rigidity or hyperlacrimation (excessive tear flow).   Treatment of synkinesis Synkinesis affects each individual differently. Some cases are mild and do not  require treatment as the symptoms gradually improve, others will benefit from a selective reduction of muscle activity.  ? Physiotherapy  Muscle exercises and functional repetitions in front of a mirror can benefit all patients recovering from facial paralysis. When synkinesis develops, the exercises aim at controlling the new movement patterns. Through neuromuscular retraining both facial function and expressions can be improved several years after injury.   ? Botulinium toxin  Botulinum toxin A is effective in providing a targeted relaxation of problematic sub-components of the facial muscles. Although the treatment in principle, requires repetition every four to five months, many patients experience a lasting improvement after a sequence of injections. In addition, transconjunctival botulinum toxin is successful in reducing tear production in the overactive lacrimal gland.   Surgical intervention (neurectomy) is occasionally indicated in severe cases of synkinesis.  Some patients are unaware that they suffer from synkinesis. Individuals with muscle rigidity whose symptoms may be misinterpreted as lack-of-recovery, may actually have well-innervated opposing muscle groups mutually blocking each other.

## 2016-06-07 ENCOUNTER — Telehealth: Payer: Self-pay | Admitting: Neurology

## 2016-06-07 NOTE — Telephone Encounter (Signed)
Called his ophthalmologist Dr. Ellie Lunch [phone]) regarding Botox for hyperlacrimation (crocodile tears). She has not done it and would ask their oculoplastics specialist, however she is concerned that drying him out would not be a good thing for the incomplete eye closure (further dry eyes). Her office will call him to set up appt to see if there is anything else that could be done.

## 2016-06-08 DIAGNOSIS — H04222 Epiphora due to insufficient drainage, left lacrimal gland: Secondary | ICD-10-CM | POA: Diagnosis not present

## 2016-06-09 ENCOUNTER — Telehealth: Payer: Self-pay | Admitting: Neurology

## 2016-06-09 NOTE — Telephone Encounter (Signed)
Patient called wanting to see about his MRI being scheduled? He was seen on 06/04/16. He said he is having eye issues and needs to  have the MRI soon.  Thank you

## 2016-06-09 NOTE — Telephone Encounter (Signed)
[facility] - Bobby Jensen will contact pt to schedule MRI.

## 2016-06-16 ENCOUNTER — Other Ambulatory Visit: Payer: Medicare Other

## 2016-06-16 DIAGNOSIS — N183 Chronic kidney disease, stage 3 (moderate): Secondary | ICD-10-CM | POA: Diagnosis not present

## 2016-06-16 DIAGNOSIS — E039 Hypothyroidism, unspecified: Secondary | ICD-10-CM | POA: Diagnosis not present

## 2016-06-16 DIAGNOSIS — E785 Hyperlipidemia, unspecified: Secondary | ICD-10-CM | POA: Diagnosis not present

## 2016-06-16 DIAGNOSIS — E1122 Type 2 diabetes mellitus with diabetic chronic kidney disease: Secondary | ICD-10-CM

## 2016-06-16 LAB — LIPID PANEL
CHOL/HDL RATIO: 2.7 ratio (ref <5.0)
CHOLESTEROL: 141 mg/dL (ref <200)
HDL: 52 mg/dL (ref >40)
LDL Cholesterol: 76 mg/dL (ref <100)
TRIGLYCERIDES: 67 mg/dL (ref <150)
VLDL: 13 mg/dL (ref <30)

## 2016-06-16 LAB — BASIC METABOLIC PANEL
BUN: 20 mg/dL (ref 7–25)
CHLORIDE: 105 mmol/L (ref 98–110)
CO2: 26 mmol/L (ref 20–31)
Calcium: 9 mg/dL (ref 8.6–10.3)
Creat: 1.1 mg/dL (ref 0.70–1.11)
GLUCOSE: 88 mg/dL (ref 65–99)
POTASSIUM: 4.4 mmol/L (ref 3.5–5.3)
SODIUM: 138 mmol/L (ref 135–146)

## 2016-06-16 LAB — TSH: TSH: 3.26 mIU/L (ref 0.40–4.50)

## 2016-06-17 LAB — HEMOGLOBIN A1C
HEMOGLOBIN A1C: 5.6 % (ref <5.7)
Mean Plasma Glucose: 114 mg/dL

## 2016-06-21 ENCOUNTER — Ambulatory Visit
Admission: RE | Admit: 2016-06-21 | Discharge: 2016-06-21 | Disposition: A | Discharge status: Home / Self Care | Payer: Medicare Other | Source: Ambulatory Visit | Attending: Neurology | Admitting: Neurology

## 2016-06-21 DIAGNOSIS — G510 Bell's palsy: Secondary | ICD-10-CM | POA: Diagnosis not present

## 2016-06-21 MED ORDER — GADOBENATE DIMEGLUMINE 529 MG/ML IV SOLN
13.0000 mL | Freq: Once | INTRAVENOUS | Status: AC | PRN
  Administered 2016-06-21: 13 mL via INTRAVENOUS

## 2016-06-23 ENCOUNTER — Ambulatory Visit (INDEPENDENT_AMBULATORY_CARE_PROVIDER_SITE_OTHER): Payer: Medicare Other | Admitting: Internal Medicine

## 2016-06-23 ENCOUNTER — Encounter: Payer: Self-pay | Admitting: Internal Medicine

## 2016-06-23 VITALS — BP 140/78 | HR 56 | Temp 97.6°F | Ht 65.0 in | Wt 162.4 lb

## 2016-06-23 DIAGNOSIS — E039 Hypothyroidism, unspecified: Secondary | ICD-10-CM | POA: Diagnosis not present

## 2016-06-23 DIAGNOSIS — G510 Bell's palsy: Secondary | ICD-10-CM

## 2016-06-23 DIAGNOSIS — N183 Chronic kidney disease, stage 3 (moderate): Secondary | ICD-10-CM | POA: Diagnosis not present

## 2016-06-23 DIAGNOSIS — I10 Essential (primary) hypertension: Secondary | ICD-10-CM

## 2016-06-23 DIAGNOSIS — E1122 Type 2 diabetes mellitus with diabetic chronic kidney disease: Secondary | ICD-10-CM | POA: Diagnosis not present

## 2016-06-23 DIAGNOSIS — M7071 Other bursitis of hip, right hip: Secondary | ICD-10-CM | POA: Diagnosis not present

## 2016-06-23 DIAGNOSIS — E785 Hyperlipidemia, unspecified: Secondary | ICD-10-CM | POA: Diagnosis not present

## 2016-06-23 NOTE — Progress Notes (Signed)
Facility  [facility]    Place of Service:   OFFICE    Allergies  Allergen Reactions  . Fioricet [Butalbital-Apap-Caffeine]     Unaware of this allergy per patient.   . Other Other (See Comments)    Profen II DM= urinary outlet obstruction Unaware of this allergy per patient.  Bobby Jensen [Guaifenesin]     Unaware of this allergy per patient.    Chief Complaint  Patient presents with  . Medical Management of Chronic Issues    Medical Management of Bell's Palsy,HTN,Thyroid,DM and Cholesterol     HPI:  Type 2 diabetes mellitus with stage 3 chronic kidney disease, without long-term current use of insulin (HCC) - excellent control  Essential hypertension -controlled  Hypothyroidism, unspecified type - compensataed  Hyperlipidemia, unspecified hyperlipidemia type - controlled  Bell's palsy - persistent right facial droop. 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 See notes from Dr. Carman Jensen, neurologist  Bursitis of right hip, unspecified bursa - stil with variable pains. Plays golf and gets around town [place]. Drives.    Medications: Patient's Medications  New Prescriptions   No medications on file  Previous Medications   ALPRAZOLAM (XANAX) 0.25 MG TABLET    Take one tablet by mouth up to three times daily for anxiety   ASPIRIN 81 MG TABLET    Take 81 mg by mouth daily.   BLOOD GLUCOSE MONITORING SUPPL (ONE TOUCH ULTRA 2) W/DEVICE KIT    Check blood sugar once daily as directed DX 250.00   CARBOXYMETHYLCELLUL-GLYCERIN (REFRESH OPTIVE OP)    Apply to eye. One drop left eye every hour   CHOLECALCIFEROL (VITAMIN D PO)    Take 1,000 Units by mouth daily.    GLIMEPIRIDE (AMARYL) 1 MG TABLET    TAKE 1 TABLET BY MOUTH ONCE DAILY   LEVOTHYROXINE (SYNTHROID, LEVOTHROID) 50 MCG TABLET    TAKE 1 TABLET BY MOUTH EVERY DAY FOR THYROID SUPPLEMENT.   LOSARTAN-HYDROCHLOROTHIAZIDE (HYZAAR) 100-25 MG TABLET    1/2 by mouth daily for blood pressure   NITROGLYCERIN (NITROSTAT) 0.4 MG SL TABLET    Place 1 tablet  (0.4 mg total) under the tongue every 5 (five) minutes as needed for chest pain.   ONE TOUCH ULTRA TEST TEST STRIP    CHECK BLOOD SUGAR ONCE DAILY AS DIRECTED.   ONETOUCH DELICA LANCETS 69C MISC    CHECK BLOOD SUGAR ONCE DAILY AS DIRECTED   SIMVASTATIN (ZOCOR) 20 MG TABLET    TAKE 1 TABLET BY MOUTH EVERY NIGHT AT BEDTIME   TEMAZEPAM (RESTORIL) 15 MG CAPSULE    TAKE ONE CAPSULE BY MOUTH AT BEDTIME AS NEEDED FOR SLEEP   TRIAMCINOLONE CREAM (KENALOG) 0.1 %    Apply topically 2 (two) times daily. Apply to rash as needed.  Modified Medications   No medications on file  Discontinued Medications   No medications on file    Review of Systems  Constitutional: Negative.  Negative for fatigue, fever and unexpected weight change.  HENT:       Left facial droop onset 05/03/2015.  Eyes: Positive for visual disturbance (corrective lenses).  Respiratory: Negative for cough and shortness of breath.   Cardiovascular: Positive for chest pain (tightness). Negative for palpitations and leg swelling.  Gastrointestinal: Negative for constipation and diarrhea.  Endocrine:       Diabetic.  Genitourinary: Negative.   Musculoskeletal: Positive for arthralgias. Negative for back pain, joint swelling and myalgias.       Unsteady gait. Denies arthritis. Chronic discomfort in the right hip  area.  Skin: Negative.   Allergic/Immunologic: Negative.   Neurological: Positive for dizziness. Negative for weakness and headaches.       Bell's pa;lsy with left facial droop. Some difficulty with balance. Has a slightly unsteady gait.  Hematological: Negative.   Psychiatric/Behavioral:       Chronic mild anxiety. Worries about his wife. Reports recent irritability    Vitals:   06/23/16 1321  BP: 140/78  Pulse: (!) 56  Temp: 97.6 F (36.4 C)  TempSrc: Oral  Weight: 162 lb 6.4 oz (73.7 kg)  Height: 5' 5"  (1.651 m)   Body mass index is 27.02 kg/m. Wt Readings from Last 3 Encounters:  06/23/16 162 lb 6.4 oz (73.7  kg)  06/04/16 159 lb 6.4 oz (72.3 kg)  03/23/16 165 lb 6.4 oz (75 kg)      Physical Exam  Constitutional: He is oriented to person, place, and time. He appears well-developed and well-nourished. No distress.  HENT:  Head: Normocephalic and atraumatic.  Right Ear: External ear normal.  Left Ear: External ear normal.  Nose: Nose normal.  Mouth/Throat: Oropharynx is clear and moist.  Eyes: Conjunctivae and EOM are normal. Pupils are equal, round, and reactive to light.  Wears corrective lenses. Reduced visual acuity bilaterally.  Neck: Neck supple. No JVD present. No tracheal deviation present. No thyromegaly present.  Cardiovascular: Regular rhythm, normal heart sounds and intact distal pulses.  Exam reveals no gallop and no friction rub.   No murmur heard. Pulmonary/Chest: Breath sounds normal. No respiratory distress. He has no wheezes. He has no rales. He exhibits no tenderness.  Abdominal: Bowel sounds are normal. He exhibits no distension and no mass. There is no tenderness.  Musculoskeletal: Normal range of motion. He exhibits no edema.  Tenderness right hip. Mild gait instability.  Lymphadenopathy:    He has no cervical adenopathy.  Neurological: He is alert and oriented to person, place, and time. No cranial nerve deficit. Coordination normal.  Normal response to vibratory testing. Severe left facial drooping   Skin: No rash noted. No erythema. No pallor.  Psychiatric: He has a normal mood and affect. His behavior is normal. Judgment and thought content normal.    Labs reviewed: Lab Summary Latest Ref Rng & Units 06/16/2016 12/16/2015 07/25/2015  Hemoglobin 13.0-17.0 g/dL (None) (None) (None)  Hematocrit 39.0-52.0 % (None) (None) (None)  White count - (None) (None) (None)  Platelet count - (None) (None) (None)  Sodium 135 - 146 mmol/L 138 140 140  Potassium 3.5 - 5.3 mmol/L 4.4 4.3 4.1  Calcium 8.6 - 10.3 mg/dL 9.0 9.1 9.6  Phosphorus - (None) (None) (None)  Creatinine  0.70 - 1.11 mg/dL 1.10 1.12(H) 1.13  AST 10 - 35 U/L (None) 16 18  Alk Phos 40 - 115 U/L (None) 54 63  Bilirubin 0.2 - 1.2 mg/dL (None) 0.6 0.6  Glucose 65 - 99 mg/dL 88 91 82  Cholesterol <200 mg/dL 141 157 (None)  HDL cholesterol >40 mg/dL 52 49 54  Triglycerides <150 mg/dL 67 65 76  LDL Direct - (None) (None) (None)  LDL Calc <100 mg/dL 76 95 89  Total protein 6.1 - 8.1 g/dL (None) 6.1 (None)  Albumin 3.6 - 5.1 g/dL (None) 3.8 4.2  Some recent data might be hidden   Lab Results  Component Value Date   TSH 3.26 06/16/2016   TSH 4.150 07/25/2015   TSH 3.970 03/28/2015   Lab Results  Component Value Date   BUN 20 06/16/2016   BUN 28 (  H) 12/16/2015   BUN 20 07/25/2015   Lab Results  Component Value Date   HGBA1C 5.6 06/16/2016   HGBA1C 5.8 (H) 12/16/2015   HGBA1C 6.2 (H) 07/25/2015    Assessment/Plan  1. Type 2 diabetes mellitus with stage 3 chronic kidney disease, without long-term current use of insulin (HCC) The current medical regimen is effective;  continue present plan and medications. - Hemoglobin A1c; Future - Comprehensive metabolic panel; Future - Microalbumin, urine; Future  2. Essential hypertension The current medical regimen is effective;  continue present plan and medications.  3. Hypothyroidism, unspecified type The current medical regimen is effective;  continue present plan and medications. - TSH; Future  4. Hyperlipidemia, unspecified hyperlipidemia type The current medical regimen is effective;  continue present plan and medications. - Lipid panel; Future  5. Bell's palsy He has been scheduled to see ophth about the excessive tearing and potential benefit of botox in lacrimal gland.  6. Bursitis of right hip, unspecified bursa Continue with mild analgesics as needed.

## 2016-06-25 ENCOUNTER — Telehealth: Payer: Self-pay | Admitting: Neurology

## 2016-06-25 NOTE — Telephone Encounter (Signed)
Patient was out of house. Spoke to wife regarding MRI. 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 Also left message that I spoke to his ophtho about any other options to help with his tearing.

## 2016-06-30 ENCOUNTER — Other Ambulatory Visit: Payer: Self-pay | Admitting: Internal Medicine

## 2016-07-06 DIAGNOSIS — H04562 Stenosis of left lacrimal punctum: Secondary | ICD-10-CM | POA: Diagnosis not present

## 2016-07-06 DIAGNOSIS — H02135 Senile ectropion of left lower eyelid: Secondary | ICD-10-CM | POA: Diagnosis not present

## 2016-07-08 ENCOUNTER — Telehealth: Payer: Self-pay | Admitting: Neurology

## 2016-07-08 NOTE — Telephone Encounter (Signed)
Caller: [place]  Urgent? Yes   Reason for the call: He was called on 06/25/16 by Dr. Delice Lesch to get results from his MRI. He would like to speak with her regarding the results and he also has some questions for Dr. Delice Lesch. Thanks

## 2016-07-08 NOTE — Telephone Encounter (Signed)
Made patient aware that Dr. Delice Lesch would be in this afternoon

## 2016-07-09 NOTE — Telephone Encounter (Signed)
Called patient back, left VM on home and mobile phone listed

## 2016-07-09 NOTE — Telephone Encounter (Signed)
Saw Dr. Dema Severin, oculoplastics who did a procedure to open the ducts in his eye. He is calling to say that has not worked and was told that a more aggressive procedure may be done. Discussed that with MRI showing changes a year out, damage appears permanent. Would concentrate efforts on symptomatic treatment.

## 2016-07-20 DIAGNOSIS — H04552 Acquired stenosis of left nasolacrimal duct: Secondary | ICD-10-CM | POA: Diagnosis not present

## 2016-07-20 DIAGNOSIS — H04551 Acquired stenosis of right nasolacrimal duct: Secondary | ICD-10-CM | POA: Diagnosis not present

## 2016-07-20 DIAGNOSIS — H04213 Epiphora due to excess lacrimation, bilateral lacrimal glands: Secondary | ICD-10-CM | POA: Diagnosis not present

## 2016-07-27 DIAGNOSIS — L7452 Secondary focal hyperhidrosis: Secondary | ICD-10-CM | POA: Diagnosis not present

## 2016-07-27 DIAGNOSIS — G244 Idiopathic orofacial dystonia: Secondary | ICD-10-CM | POA: Diagnosis not present

## 2016-07-27 DIAGNOSIS — H04213 Epiphora due to excess lacrimation, bilateral lacrimal glands: Secondary | ICD-10-CM | POA: Diagnosis not present

## 2016-07-27 DIAGNOSIS — H04212 Epiphora due to excess lacrimation, left lacrimal gland: Secondary | ICD-10-CM | POA: Diagnosis not present

## 2016-08-18 ENCOUNTER — Other Ambulatory Visit: Payer: Self-pay | Admitting: Internal Medicine

## 2016-08-27 ENCOUNTER — Encounter (INDEPENDENT_AMBULATORY_CARE_PROVIDER_SITE_OTHER): Payer: Self-pay

## 2016-08-27 ENCOUNTER — Encounter: Payer: Self-pay | Admitting: Cardiology

## 2016-08-27 ENCOUNTER — Ambulatory Visit (INDEPENDENT_AMBULATORY_CARE_PROVIDER_SITE_OTHER): Payer: Medicare Other | Admitting: Cardiology

## 2016-08-27 VITALS — BP 116/58 | HR 72 | Ht 65.0 in | Wt 157.0 lb

## 2016-08-27 DIAGNOSIS — I2510 Atherosclerotic heart disease of native coronary artery without angina pectoris: Secondary | ICD-10-CM | POA: Diagnosis not present

## 2016-08-27 DIAGNOSIS — R079 Chest pain, unspecified: Secondary | ICD-10-CM

## 2016-08-27 DIAGNOSIS — E785 Hyperlipidemia, unspecified: Secondary | ICD-10-CM

## 2016-08-27 DIAGNOSIS — I10 Essential (primary) hypertension: Secondary | ICD-10-CM | POA: Diagnosis not present

## 2016-08-27 DIAGNOSIS — R42 Dizziness and giddiness: Secondary | ICD-10-CM

## 2016-08-27 DIAGNOSIS — R001 Bradycardia, unspecified: Secondary | ICD-10-CM | POA: Diagnosis not present

## 2016-08-27 DIAGNOSIS — I2583 Coronary atherosclerosis due to lipid rich plaque: Secondary | ICD-10-CM | POA: Diagnosis not present

## 2016-08-27 MED ORDER — NITROGLYCERIN 0.4 MG SL SUBL: 0.4000 mg | SUBLINGUAL_TABLET | SUBLINGUAL | 3 refills | Status: DC | PRN

## 2016-08-27 NOTE — Patient Instructions (Signed)
Medication Instructions:  Your physician recommends that you continue on your current medications as directed. Please refer to the Current Medication list given to you today.   Labwork: None ordered  Testing/Procedures: None ordered  Follow-Up: Your physician wants you to follow-up in: 6 months with Dr.Nelson You will receive a reminder letter in the mail two months in advance. If you don't receive a letter, please call our office to schedule the follow-up appointment.   Any Other Special Instructions Will Be Listed Below (If Applicable).     If you need a refill on your cardiac medications before your next appointment, please call your pharmacy.   

## 2016-08-27 NOTE — Progress Notes (Signed)
Patient ID: Bobby Jensen, male   DOB: 1927/12/02, 83 y.o.   MRN: [id number]    Patient Name: Bobby Jensen Date of Encounter: 08/27/2016  Primary Care Provider:  Gayland Curry, DO Primary Cardiologist: Ena Dawley  Problem List   Past Medical History:  Diagnosis Date  . Bladder neck obstruction 03/28/2008  . Contact with or exposure to tuberculosis 03/15/1992  . Elevated prostate specific antigen (PSA) 03/15/1998  . Gastric ulcer, unspecified as acute or chronic, without mention of hemorrhage, perforation, or obstruction 03/15/1993  . Hypertension 07/08/1998  . Hypertrophy of prostate without urinary obstruction and other lower urinary tract symptoms (LUTS) 03/15/1989  . 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 Inguinal hernia without mention of obstruction or gangrene, unilateral or unspecified, (not specified as recurrent) 11/20/2008  . Insomnia, unspecified 11/01/2003  . Kidney calculi 08/20/2009  . Other and unspecified hyperlipidemia 12/23/2004  . Other malaise and fatigue 12/23/2004  . Pain in joint, pelvic region and thigh 12/17/2009  . Pain in joint, shoulder region 11/01/2003  . Peptic ulcer, unspecified site, unspecified as acute or chronic, without mention of hemorrhage, perforation, or obstruction 06/25/2003  . Personal history of malaria 03/16/1939  . Rosacea 08/20/2009  . Type II or unspecified type diabetes mellitus without mention of complication, not stated as uncontrolled 07/15/2006  . Unspecified constipation 08/05/2010  . Unspecified hypothyroidism 12/17/2009   Past Surgical History:  Procedure Laterality Date  . APPENDECTOMY  1956  . HEMORROIDECTOMY  06/2002   Dr. Lennie Hummer  . HERNIA REPAIR Bilateral 11/2008   inguinal  . PILONIDAL CYST EXCISION  1963  . PROSTATE SURGERY    . TOTAL HIP ARTHROPLASTY Right 07/10/2009   Dr. Telford Nab   Allergies  Allergies  Allergen Reactions  . Fioricet [Butalbital-Apap-Caffeine]     Unaware of this  allergy per patient.   . Other Other (See Comments)    Profen II DM= urinary outlet obstruction Unaware of this allergy per patient.  [name] [Guaifenesin]     Unaware of this allergy per patient.   HPI  A very pleasant younger appearing 83 year old gentleman who is originally from [place] who was referred to [place] by his primary care physician for concern of bradycardia and risk stratification for coronary artery disease. The patient has a history of known insulin-dependent diabetes mellitus, hypertension and hyperlipidemia. He has family history of premature coronary artery disease, his father died of myocardial infarction in his 41s. His mother lived to age of 75 his 81 year old sister still alive. The patient is very active physically and intellectually. He has worked until he was 39 traveling around the [place]. He is a Social research officer, government. Golf on a regular basis and other than he is hip replacement he has no limitations in regards to chest pain, shortness of breath, palpitations or syncope. He occasionally feels dizzy but has never experienced syncopal episode. He is very compliant with his medications.  03/01/2014 - the patient is coming after 6 months. Meanwhile he was evaluated for possible sick sinus syndrome that was ruled out on the 24-hour Holter monitor. It only showed 2.1 second part at night otherwise no significant bradycardia and no other arrhythmia such as atrial fibrillation were identified. The patient states that he continues being very active playing called and denies any chest pain or shortness of breath. His most limiting factor right now is his hip pain for which he is being evaluated. He stays active helping at home on for example yesterday he vacuumed three-story  house and he denied any symptoms during that. He is very compliant with his medicines. On at the last visit we perform a stress test that showed a scar in the inferolateral wall with mild peri-infarct ischemia, however  considering leg of symptoms we intensified medical therapy and patient continues being asymptomatic.  11/01/14 - 10 months follow up, he feels great, occasional chest pain when emotionally upset, plays golf twice a week, no CP, stable Dyspnea on moderate exertion. Frequent dizziness, no presyncope or syncope, his losartan-hctz was recently decreased from 100-25 to 50-12.5, he feels slightly better. Complaint with his meds.   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 He denies any chest pain or shortness of breath, he has been able to play golf he is limiting factor is IT when. He denies any palpitation and has been experiencing occasional dizziness however this hasn't changed in the last 2 years. He denies any syncope or falls.  Home Medications  Prior to Admission medications   Medication Sig Start Date End Date Taking? Authorizing Provider  Blood Glucose Monitoring Suppl (ONE TOUCH ULTRA 2) W/DEVICE KIT Check blood sugar once daily as directed DX 250.00 03/06/13  Yes Estill Dooms, MD  Cholecalciferol (VITAMIN D PO) Take 1,000 Units by mouth daily.    Yes Historical Provider, MD  glimepiride (AMARYL) 1 MG tablet Take one tablet by mouth once daily to control blood sugar 08/08/13  Yes Estill Dooms, MD  Glucose Blood (FREESTYLE LITE TEST VI)  02/24/13  Yes Historical Provider, MD  glucose blood (ONE TOUCH ULTRA TEST) test strip Check blood sugar twice daily 07/11/13  Yes Estill Dooms, MD  levothyroxine (SYNTHROID, LEVOTHROID) 50 MCG tablet Take one tablet once a day for thyroid supplement 09/06/12  Yes Estill Dooms, MD  losartan-hydrochlorothiazide [facility]) 100-25 MG per tablet Take one tablet by mouth once daily to control blood pressure 04/30/13  Yes Tiffany L Reed, DO  ONETOUCH DELICA LANCETS 16X MISC Check blood sugar once daily as directed DX  250.00 03/06/13  Yes Estill Dooms, MD  simvastatin (ZOCOR) 40 MG tablet TAKE 1/2 TABLET BY MOUTH AT BEDTIME TO LOWER CHOLESTEROL   Yes Estill Dooms, MD  temazepam (RESTORIL) 15 MG capsule TAKE 1 CAPSULE BY MOUTH AT BEDTIME AS NEEDED FOR SLEEP 07/11/13  Yes Estill Dooms, MD  triamcinolone cream (KENALOG) 0.1 % Apply topically 2 (two) times daily. Apply to rash as needed. 09/06/12  Yes Estill Dooms, MD    Family History  Family History  Problem Relation Age of Onset  . Heart disease Father     Social History  Social History   Social History  . Marital status: Married    Spouse name: N/A  . Number of children: N/A  . Years of education: N/A   Occupational History  . Not on file.   Social History Main Topics  . Smoking status: Former Smoker    Quit date: 07/31/1978  . Smokeless tobacco: Never Used  . Alcohol use Yes     Comment: Occ. glass of wine  . Drug use: No  . Sexual activity: Yes    Partners: Female   Other Topics Concern  . Not on file   Social History Narrative  . No narrative on file     Review of Systems, as per HPI, otherwise negative  General:  No chills, fever, night sweats or weight changes.  Cardiovascular:  No chest pain, dyspnea on exertion, edema, orthopnea, palpitations, paroxysmal nocturnal dyspnea. Dermatological: No rash, lesions/masses Respiratory: No cough, dyspnea Urologic: No hematuria, dysuria Abdominal:   No nausea, vomiting, diarrhea, bright red blood per rectum, melena, or hematemesis Neurologic:  No visual changes, wkns, changes in mental status. All other systems reviewed and are otherwise negative except as noted above.  Physical Exam  Blood pressure (!) 116/58, pulse 72, height 5' 5"  (1.651 m), weight 157 lb (71.2 kg).  General: Pleasant, NAD Psych: Normal affect. Neuro: Alert and oriented X 3. Moves all extremities spontaneously. HEENT: Normal  Neck: Supple without bruits or JVD. Lungs:  Resp regular and unlabored,  CTA. Heart: RRR no s3, s4, or murmurs. Abdomen: Soft, non-tender, non-distended, BS + x 4.  Extremities: No clubbing, cyanosis or edema. DP/PT/Radials 2+ and equal bilaterally.  Labs:  No results for input(s): CKTOTAL, CKMB, TROPONINI in the last 72 hours. Lab Results  Component Value Date   WBC 7.1 07/14/2009   HGB 16.0 07/31/2011   HCT 47.0 07/31/2011   MCV 91.2 07/14/2009   PLT 164 REPEATED TO VERIFY DELTA CHECK NOTED 07/14/2009    No results found for: DDIMER Invalid input(s): POCBNP    Component Value Date/Time   NA 138 06/16/2016 0833   NA 140 07/25/2015 0825   K 4.4 06/16/2016 0833   CL 105 06/16/2016 0833   CO2 26 06/16/2016 0833   GLUCOSE 88 06/16/2016 0833   BUN 20 06/16/2016 0833   BUN 20 07/25/2015 0825   CREATININE 1.10 06/16/2016 0833   CALCIUM 9.0 06/16/2016 0833   PROT 6.1 12/16/2015 0805   PROT 6.5 07/25/2015 0825   ALBUMIN 3.8 12/16/2015 0805   ALBUMIN 4.2 07/25/2015 0825   AST 16 12/16/2015 0805   ALT 11 12/16/2015 0805   ALKPHOS 54 12/16/2015 0805   BILITOT 0.6 12/16/2015 0805   BILITOT 0.6 07/25/2015 0825   GFRNONAA 59 (L) 07/25/2015 0825   GFRAA 68 07/25/2015 0825   Lab Results  Component Value Date   CHOL 141 06/16/2016   HDL 52 06/16/2016   LDLCALC 76 06/16/2016   TRIG 67 06/16/2016    Accessory Clinical Findings  Echocardiogram - none  ECG - sinus bradycardia, 53 beats per minute, sinus pauses and frequent PACs.  Nuclear stress test: 08/31/2013 Impression Exercise Capacity: Lexiscan with low level exercise. BP Response: Normal blood pressure response. Clinical Symptoms: Chest pain ECG Impression: No significant ST segment change suggestive of ischemia. Comparison with Prior Nuclear Study: No images to compare  Overall Impression: This is a low/medium risk scan. There is scar affecting the entire inferolateral wall and the mid/apical anterolateral wall. There is peri-infarct ischemia at the base of the anterolateral  wall.  LV Ejection Fraction: 63%. LV Wall Motion: Wall motion is good overall. There is decreased motion in the lateral wall.  Dola Argyle, MD  EKG performed today 08/27/2016 showed normal sinus rhythm with complete right Dr. Aaron Edelman block occasional PVCs otherwise unchanged from prior. This was personally reviewed.   Assessment & Plan  1. Bradycardia with sinus pauses and frequent PACs. 24-hour Holter monitoring in 2016 showed no significant pauses - the longest was 2.1 seconds. No signs of SSS, no a-fib found.  He has stable infrequent dizziness, no falls or syncope, no indication for a pacer or repeat Holter monitoring right now.  2. CAD - patient is very active for his age and fairly asymptomatic  however significant risk factors and family history. A Lexiscan nuclear stress test showed scar in the inferolateral wall with mild peri-infarct ischemia. The patient is completely asymptomatic on exertion, he experiences occasional chest tightness when he gets upset, this is relieved by nitroglycerin, will prescribe refill.   3. Hypertension - controlled  4. Hyperlipidemia - he is tolerating simvastatin 20 mg daily well in all his lipids are at goal.  5. Dizziness - B/L carotid US in August 2016 showed bilateral 1-39% stenosis, follow-up just as needed..  Followup in 1 year.    Ena Dawley, MD, [facility] 08/27/2016, 9:44 AM

## 2016-08-28 ENCOUNTER — Other Ambulatory Visit: Payer: Self-pay | Admitting: Internal Medicine

## 2016-08-28 ENCOUNTER — Other Ambulatory Visit: Payer: Self-pay | Admitting: Nurse Practitioner

## 2016-09-10 ENCOUNTER — Ambulatory Visit: Payer: Medicare Other | Admitting: Neurology

## 2016-09-10 NOTE — Addendum Note (Signed)
Addended by: Royann Shivers A on: 09/10/2016 03:59 PM   Modules accepted: Orders

## 2016-10-01 ENCOUNTER — Other Ambulatory Visit: Payer: Self-pay | Admitting: Internal Medicine

## 2016-10-25 ENCOUNTER — Other Ambulatory Visit: Payer: Self-pay | Admitting: *Deleted

## 2016-10-25 DIAGNOSIS — E039 Hypothyroidism, unspecified: Secondary | ICD-10-CM

## 2016-10-25 DIAGNOSIS — I10 Essential (primary) hypertension: Secondary | ICD-10-CM

## 2016-10-25 DIAGNOSIS — E785 Hyperlipidemia, unspecified: Secondary | ICD-10-CM

## 2016-11-09 ENCOUNTER — Other Ambulatory Visit: Payer: Self-pay | Admitting: Internal Medicine

## 2016-12-07 ENCOUNTER — Other Ambulatory Visit: Payer: Self-pay | Admitting: Internal Medicine

## 2016-12-07 NOTE — Telephone Encounter (Signed)
rx called into pharmacy

## 2016-12-07 NOTE — Telephone Encounter (Signed)
Ok to [place]? Last filled 08/30/2016 #[id number]

## 2016-12-07 NOTE — Telephone Encounter (Signed)
Let's give him just 30 so we can discuss it at his appt in October.

## 2016-12-09 ENCOUNTER — Other Ambulatory Visit: Payer: Self-pay | Admitting: Internal Medicine

## 2016-12-20 ENCOUNTER — Other Ambulatory Visit: Payer: Medicare Other

## 2016-12-23 ENCOUNTER — Ambulatory Visit: Payer: Medicare Other | Admitting: Internal Medicine

## 2017-01-01 LAB — HM DIABETES EYE EXAM

## 2017-01-07 DIAGNOSIS — E119 Type 2 diabetes mellitus without complications: Secondary | ICD-10-CM | POA: Diagnosis not present

## 2017-01-07 DIAGNOSIS — H2513 Age-related nuclear cataract, bilateral: Secondary | ICD-10-CM | POA: Diagnosis not present

## 2017-01-07 DIAGNOSIS — H5203 Hypermetropia, bilateral: Secondary | ICD-10-CM | POA: Diagnosis not present

## 2017-01-10 ENCOUNTER — Encounter: Payer: Self-pay | Admitting: *Deleted

## 2017-01-18 IMAGING — US US RENAL
1 series · 14 of 25 positions shown · non-contrast
Comparison: Noncontrast CT 05/07/2008. No previous MRI is available
for comparison.

CLINICAL DATA: Abnormal MRI, question mass.

EXAM:
RENAL/URINARY TRACT ULTRASOUND COMPLETE

[Series 1: us renal · 0.28mm/px · 14 of 48 slices shown]
[im 1/48]
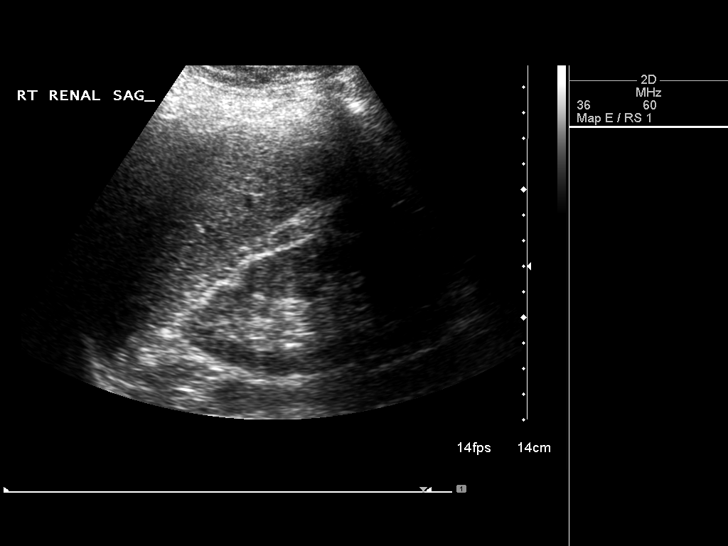
[im 4/48]
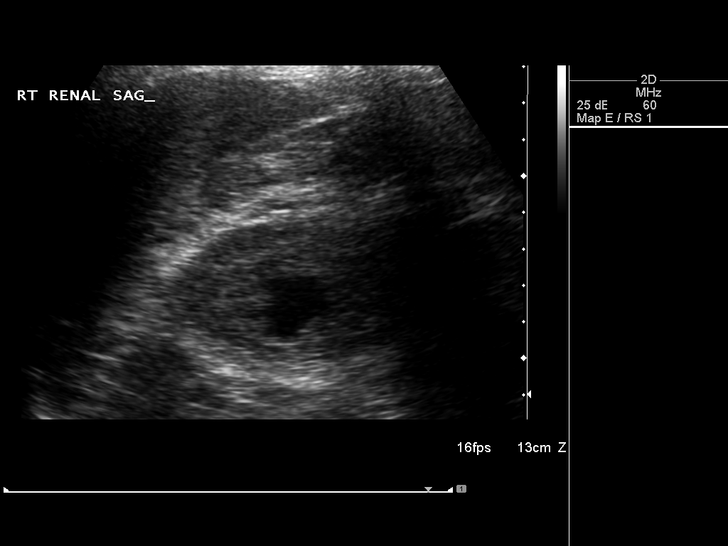
[im 8/48]
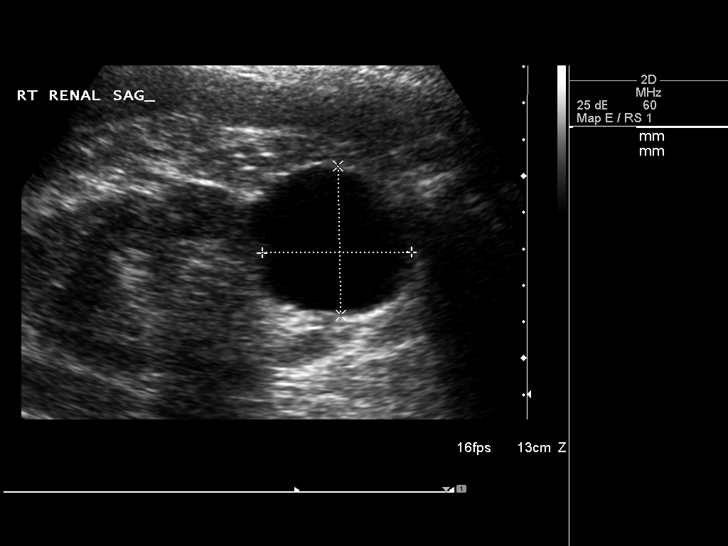
[im 12/48]
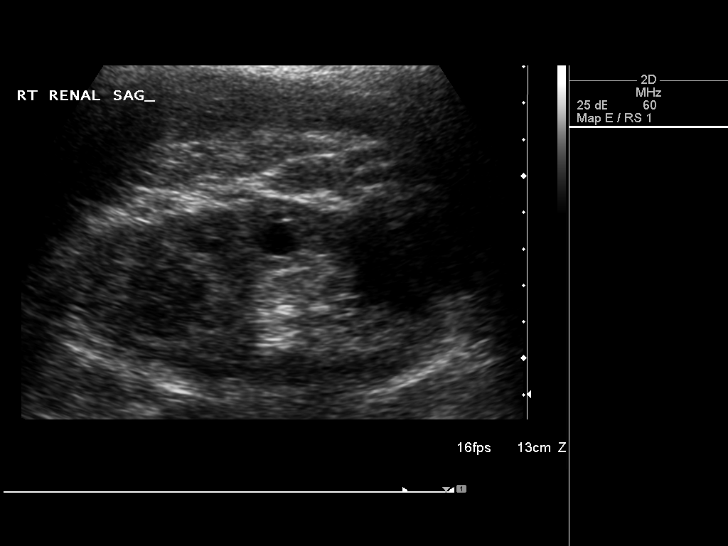
[im 16/48]
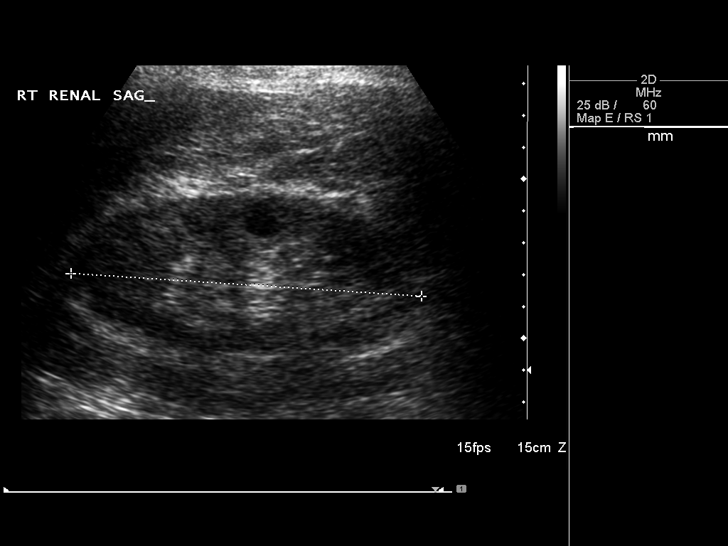
[im 18/48]
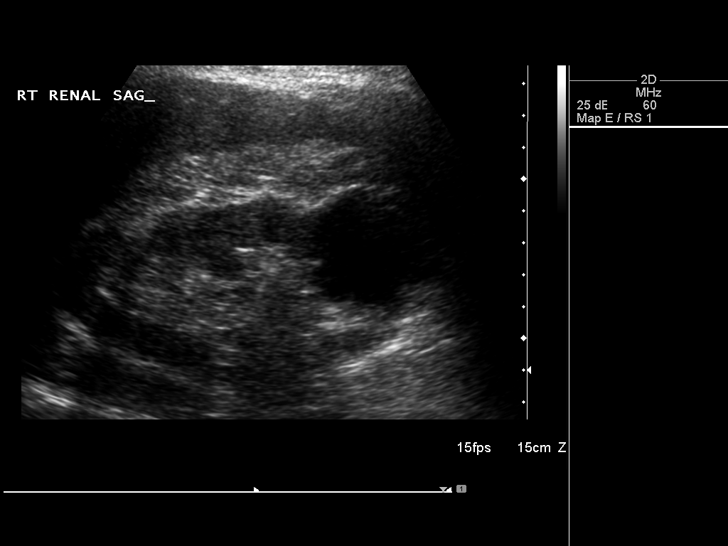
[im 22/48]
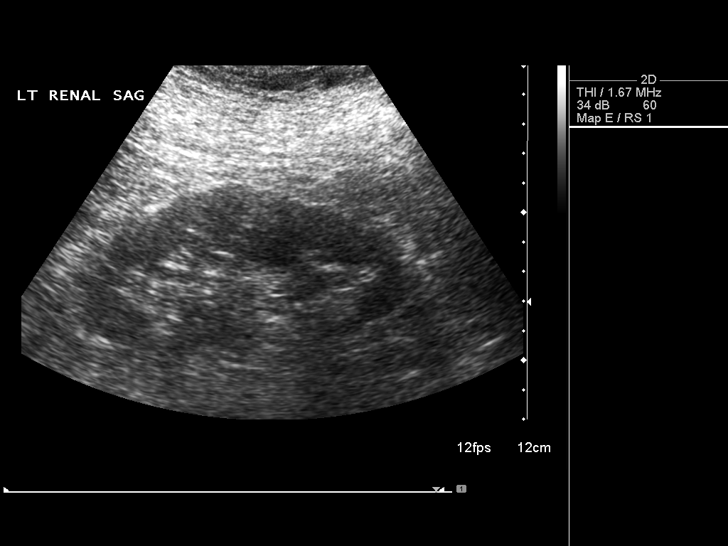
[im 26/48]
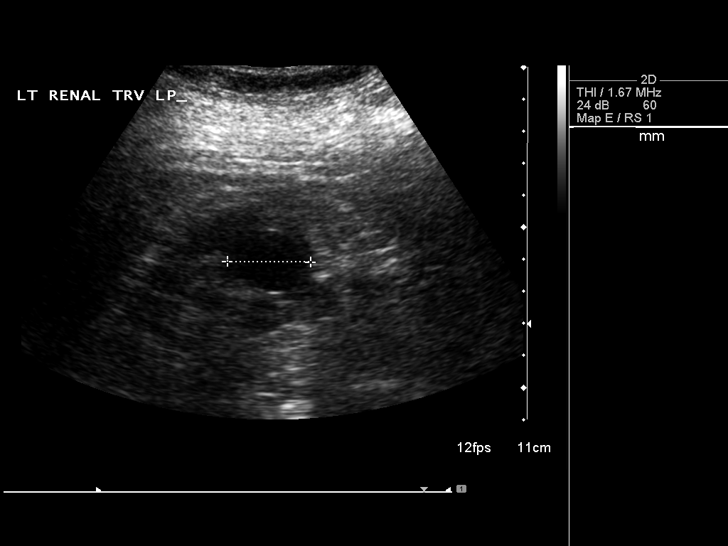
[im 30/48]
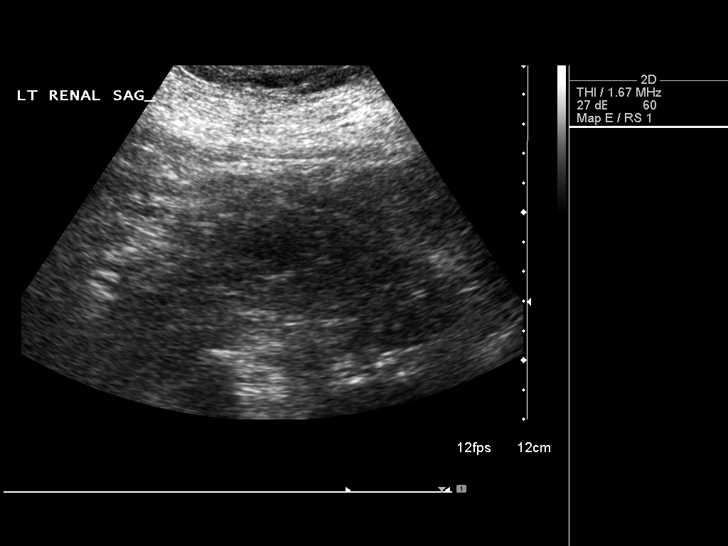
[im 32/48]
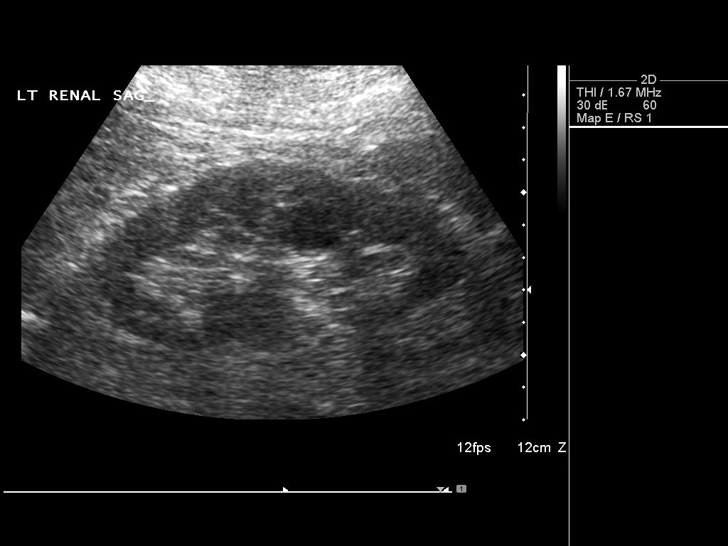
[im 36/48]
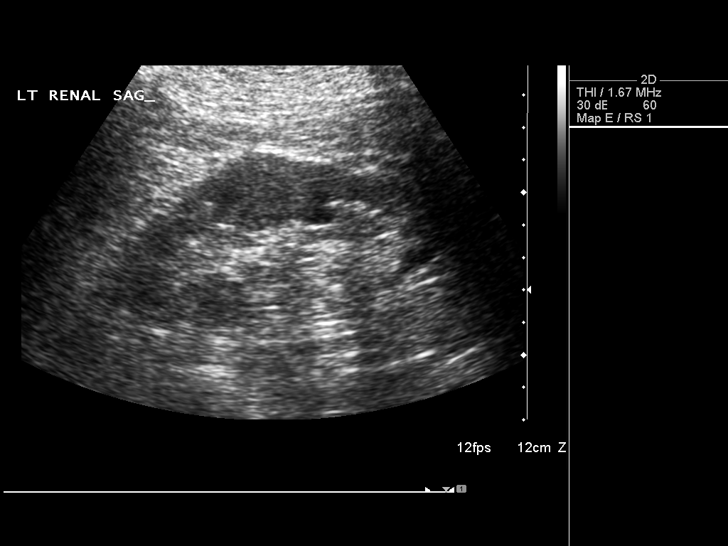
[im 40/48]
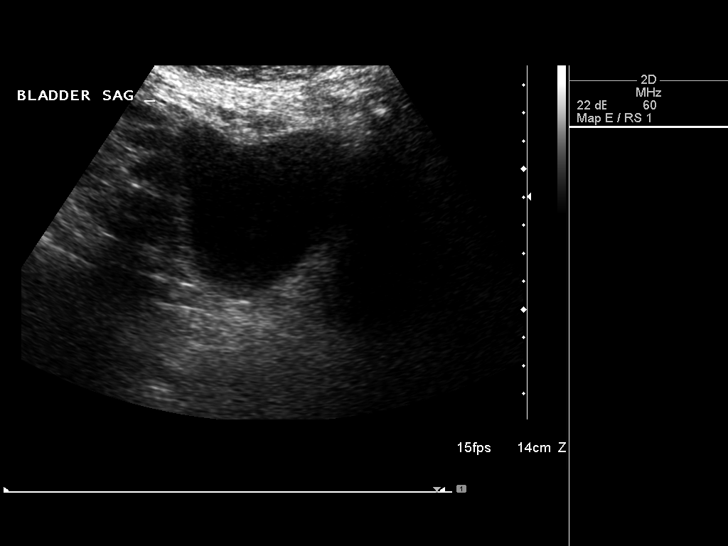
[im 44/48]
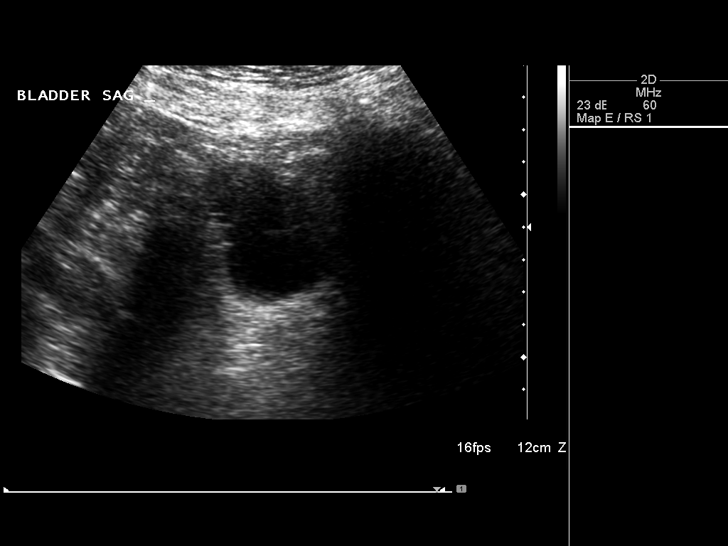
[im 48/48]
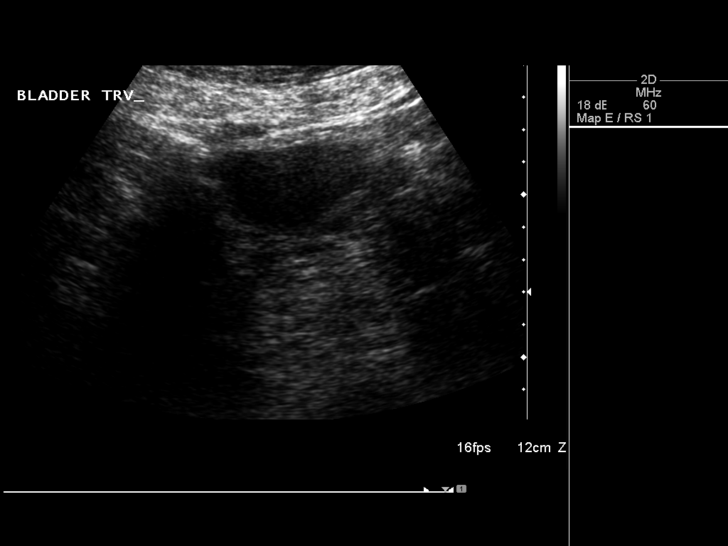

[14 of 25 positions shown; findings below may reference images not displayed]

FINDINGS: Right Kidney:

Length: 11.0 cm. Simple appearing cysts within the the right kidney,
the largest in the lower pole measuring up to 4.1 cm. Normal
echotexture. No hydronephrosis.

Left Kidney:

Length: 11.5 cm. Midpole simple appearing cyst measuring 2.6 cm.
Normal echotexture. No hydronephrosis.

Bladder:

Appears normal for degree of bladder distention. Mildly prominent
prostate.
IMPRESSION: Bilateral simple appearing renal cysts, the largest in the right
lower pole measuring 4.1 cm. No hydronephrosis. No suspicious mass.
No acute findings.

## 2017-02-04 ENCOUNTER — Other Ambulatory Visit: Payer: Self-pay | Admitting: Internal Medicine

## 2017-02-07 ENCOUNTER — Other Ambulatory Visit: Payer: Self-pay | Admitting: Internal Medicine

## 2017-02-07 NOTE — Telephone Encounter (Signed)
Only gave 30 day supply, pt has not established with Dr. Mariea Clonts yet, canceled last appt.

## 2017-02-26 ENCOUNTER — Other Ambulatory Visit: Payer: Self-pay | Admitting: Internal Medicine

## 2017-02-28 NOTE — Telephone Encounter (Signed)
Approve xanax for 30 days, no refills, decline temazepam for now.  Has appt coming up with me and labs.  Hopefully, we'll be able to make some changes to get him off of these meds.

## 2017-02-28 NOTE — Telephone Encounter (Signed)
Temazepam last filled 02/07/17, too early, Xanax last filled 12/09/2016

## 2017-03-09 ENCOUNTER — Other Ambulatory Visit: Payer: Self-pay | Admitting: Internal Medicine

## 2017-03-11 ENCOUNTER — Other Ambulatory Visit: Payer: Medicare Other

## 2017-03-11 DIAGNOSIS — I10 Essential (primary) hypertension: Secondary | ICD-10-CM | POA: Diagnosis not present

## 2017-03-11 DIAGNOSIS — N183 Chronic kidney disease, stage 3 unspecified: Secondary | ICD-10-CM

## 2017-03-11 DIAGNOSIS — E039 Hypothyroidism, unspecified: Secondary | ICD-10-CM

## 2017-03-11 DIAGNOSIS — E785 Hyperlipidemia, unspecified: Secondary | ICD-10-CM | POA: Diagnosis not present

## 2017-03-11 DIAGNOSIS — E1122 Type 2 diabetes mellitus with diabetic chronic kidney disease: Secondary | ICD-10-CM | POA: Diagnosis not present

## 2017-03-12 LAB — COMPREHENSIVE METABOLIC PANEL
AG Ratio: 1.8 (calc) (ref 1.0–2.5)
ALT: 12 U/L (ref 9–46)
AST: 17 U/L (ref 10–35)
Albumin: 4 g/dL (ref 3.6–5.1)
Alkaline phosphatase (APISO): 52 U/L (ref 40–115)
BUN/Creatinine Ratio: 20 (calc) (ref 6–22)
BUN: 23 mg/dL (ref 7–25)
CO2: 27 mmol/L (ref 20–32)
Calcium: 9.1 mg/dL (ref 8.6–10.3)
Chloride: 104 mmol/L (ref 98–110)
Creat: 1.17 mg/dL — ABNORMAL HIGH (ref 0.70–1.11)
Globulin: 2.2 g/dL (calc) (ref 1.9–3.7)
Glucose, Bld: 87 mg/dL (ref 65–99)
Potassium: 3.9 mmol/L (ref 3.5–5.3)
Sodium: 139 mmol/L (ref 135–146)
Total Bilirubin: 0.7 mg/dL (ref 0.2–1.2)
Total Protein: 6.2 g/dL (ref 6.1–8.1)

## 2017-03-12 LAB — HEMOGLOBIN A1C
Hgb A1c MFr Bld: 5.7 % of total Hgb — ABNORMAL HIGH (ref <5.7)
Mean Plasma Glucose: 117 (calc)
eAG (mmol/L): 6.5 (calc)

## 2017-03-12 LAB — LIPID PANEL
Cholesterol: 155 mg/dL (ref <200)
HDL: 56 mg/dL (ref >40)
LDL Cholesterol (Calc): 83 mg/dL (calc)
Non-HDL Cholesterol (Calc): 99 mg/dL (calc) (ref <130)
Total CHOL/HDL Ratio: 2.8 (calc) (ref <5.0)
Triglycerides: 81 mg/dL (ref <150)

## 2017-03-12 LAB — TSH: TSH: 2.86 mIU/L (ref 0.40–4.50)

## 2017-03-12 LAB — MICROALBUMIN, URINE: Microalb, Ur: 4 mg/dL

## 2017-03-17 ENCOUNTER — Encounter: Payer: Self-pay | Admitting: Internal Medicine

## 2017-03-17 ENCOUNTER — Ambulatory Visit (INDEPENDENT_AMBULATORY_CARE_PROVIDER_SITE_OTHER): Payer: Medicare Other | Admitting: Internal Medicine

## 2017-03-17 VITALS — BP 120/60 | HR 51 | Temp 97.5°F | Ht 65.0 in | Wt 158.0 lb

## 2017-03-17 DIAGNOSIS — N183 Chronic kidney disease, stage 3 unspecified: Secondary | ICD-10-CM

## 2017-03-17 DIAGNOSIS — I10 Essential (primary) hypertension: Secondary | ICD-10-CM

## 2017-03-17 DIAGNOSIS — M1611 Unilateral primary osteoarthritis, right hip: Secondary | ICD-10-CM

## 2017-03-17 DIAGNOSIS — E039 Hypothyroidism, unspecified: Secondary | ICD-10-CM | POA: Diagnosis not present

## 2017-03-17 DIAGNOSIS — E785 Hyperlipidemia, unspecified: Secondary | ICD-10-CM | POA: Diagnosis not present

## 2017-03-17 DIAGNOSIS — E1122 Type 2 diabetes mellitus with diabetic chronic kidney disease: Secondary | ICD-10-CM

## 2017-03-17 DIAGNOSIS — G510 Bell's palsy: Secondary | ICD-10-CM

## 2017-03-17 NOTE — Progress Notes (Signed)
Location:  [facility] clinic Provider:  Selene Peltzer L. Mariea Clonts, D.O., C.M.D.  Code Status: DNR, says when he passes away, it's God's will per his religious preferences  Goals of Care:  Advanced Directives 03/23/2016  Does Patient Have a Medical Advance Directive? Yes  Type of Advance Directive Living will  Copy of [place] in Chart? -   Chief Complaint  Patient presents with  . Medical Management of Chronic Issues    26mh follow-up, transfer from Dr. GNyoka Cowden   HPI: Patient is a 84y.o. male seen today for medical management of chronic diseases and first visit with me after Dr. GRolly Salterretirement.    [place].  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  Retired at [employer]    He has seen Dr. GNyoka Cowdenas long as he's been in practice.    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  Had to have the corners of his lids sewn shut to get drops in.  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    Outside of that, he's in good health.  They have a split level house and his bedroom is upstairs and he's able to go up and down the stairs.  [place]  Still driving.  DMII:  On glipizide 16mdaily.  CBG 99 today.  He monitors it daily.  Has CKD3.  Had foot exam last visit and did great!  Likes sweets and bread.  Still maintains good levels with hba1c 5.7.    Hypothyroidism:  Synthroid 5055m    Takes temazepam for 30+ years b/c of traveling in different time zones.  He was getting 90 pills at a time.  Likes a 90 day supply with refills of his meds.     He used to [facility] [place] has flown airMining engineerlays golf now.  Mother lives to 100yo.  Has three children, 5 greats, 3  great greats.  Will be married 66 73ars coming up.  [place] doctor [place]    He's taken the shingles shot since that event.    BPH:  Had prostate partially removed.  No longer needing examinations for 15 years.    He just saw Dr. McCEllie Lunchd no retinopathy noted.   Past Medical History:  Diagnosis Date  . Bladder neck obstruction 03/28/2008  . Contact with or exposure to tuberculosis 03/15/1992  . Elevated prostate specific antigen (PSA) 03/15/1998  . Gastric ulcer, unspecified as acute or chronic, without mention of hemorrhage, perforation, or obstruction 03/15/1993  . Hypertension 07/08/1998  . Hypertrophy of prostate without urinary obstruction and other lower urinary tract symptoms (LUTS) 03/15/1989  . 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 Inguinal hernia without mention of obstruction or gangrene, unilateral or unspecified, (not specified as recurrent) 11/20/2008  . Insomnia, unspecified 11/01/2003  . Kidney calculi 08/20/2009  . Other and unspecified hyperlipidemia 12/23/2004  . Other malaise and fatigue 12/23/2004  . Pain in joint, pelvic region and thigh 12/17/2009  . Pain in joint, shoulder region 11/01/2003  . Peptic ulcer, unspecified site, unspecified as  acute or chronic, without mention of hemorrhage, perforation, or obstruction 06/25/2003  . Personal history of malaria 03/16/1939  . Rosacea 08/20/2009  . Type II or unspecified type diabetes mellitus without mention of complication, not stated as uncontrolled 07/15/2006  . Unspecified constipation 08/05/2010  . Unspecified hypothyroidism 12/17/2009    Past Surgical History:  Procedure Laterality Date  . APPENDECTOMY  1956  . HEMORROIDECTOMY  06/2002   Dr. Lennie Hummer  . HERNIA REPAIR Bilateral 11/2008   inguinal  . PILONIDAL CYST EXCISION  1963  . PROSTATE SURGERY    . TOTAL HIP ARTHROPLASTY Right 07/10/2009   Dr. Telford Nab    Allergies  Allergen Reactions  . Fioricet [Butalbital-Apap-Caffeine]       Unaware of this allergy per patient.   . Other Other (See Comments)    Profen II DM= urinary outlet obstruction Unaware of this allergy per patient.  [name] [Guaifenesin]     Unaware of this allergy per patient.    Outpatient Encounter Medications as of 03/17/2017  Medication Sig  . ALPRAZolam (XANAX) 0.25 MG tablet Take 0.125-0.25 mg by mouth daily as needed for anxiety.  Marland Kitchen aspirin 81 MG tablet Take 81 mg by mouth daily.  . Carboxymethylcellul-Glycerin (REFRESH OPTIVE OP) Place 1 drop into the left eye daily.   . Cholecalciferol (VITAMIN D PO) Take 1,000 Units by mouth daily.   Marland Kitchen glimepiride (AMARYL) 1 MG tablet TAKE 1 TABLET BY MOUTH ONCE DAILY  . levothyroxine (SYNTHROID, LEVOTHROID) 50 MCG tablet TAKE 1 TABLET BY MOUTH EVERY DAY FOR THYROID SUPPLEMENT  . losartan-hydrochlorothiazide (HYZAAR) 100-25 MG tablet 1/2 by mouth daily for blood pressure  . nitroGLYCERIN (NITROSTAT) 0.4 MG SL tablet Place 1 tablet (0.4 mg total) under the tongue every 5 (five) minutes as needed for chest pain.  . ONE TOUCH ULTRA TEST test strip CHECK BLOOD SUGAR ONCE DAILY AS DIRECTED.  Marland Kitchen ONETOUCH DELICA LANCETS 76B MISC Check blood sugar once daily as directed E11.22  . simvastatin (ZOCOR) 20 MG tablet TAKE 1 TABLET BY MOUTH EVERY NIGHT AT BEDTIME  . temazepam (RESTORIL) 15 MG capsule TAKE ONE CAPSULE BY MOUTH AT BEDTIME AS NEEDED FOR SLEEP. MUST KEEP APPOINTMENT FOR REFILLS  . triamcinolone cream (KENALOG) 0.1 % APPLY TO RASH TWICE DAILY AS NEEDED  . [DISCONTINUED] ALPRAZolam (XANAX) 0.25 MG tablet TAKE 1 TABLET BY MOUTH UP TO THREE TIMES DAILY FOR ANXIETY  . [DISCONTINUED] Blood Glucose Monitoring Suppl (ONE TOUCH ULTRA 2) W/DEVICE KIT Check blood sugar once daily as directed DX 250.00  . [DISCONTINUED] ONETOUCH DELICA LANCETS 34L MISC CHECK BLOOD SUGAR ONCE DAILY AS DIRECTED   No facility-administered encounter medications on file as of 03/17/2017.     Review of Systems:  Review of Systems   Constitutional: Negative for chills, fever and malaise/fatigue.  HENT: Negative for congestion.   Eyes: Negative for blurred vision.  Respiratory: Negative for cough and shortness of breath.   Cardiovascular: Negative for chest pain and palpitations.  Gastrointestinal: Negative for abdominal pain, blood in stool, constipation, diarrhea, heartburn, melena, nausea and vomiting.  Genitourinary: Negative for dysuria, frequency and urgency.  Musculoskeletal: Positive for joint pain.       Right hip pain  Skin: Negative for itching and rash.  Neurological: Negative for dizziness, loss of consciousness and weakness.  Psychiatric/Behavioral: Negative for depression and memory loss. The patient is nervous/anxious and has insomnia.     Health Maintenance  Topic Date Due  . FOOT EXAM  05/22/2015  . PNA vac  Low Risk Adult (2 of 2 - PPSV23) 03/23/2017  . HEMOGLOBIN A1C  09/09/2017  . OPHTHALMOLOGY EXAM  01/01/2018  . TETANUS/TDAP  03/06/2023  . INFLUENZA VACCINE  Completed    Physical Exam: Vitals:   03/17/17 0835  BP: 120/60  Pulse: (!) 51  Temp: (!) 97.5 F (36.4 C)  TempSrc: Oral  SpO2: 98%  Weight: 158 lb (71.7 kg)  Height: 5' 5"  (1.651 m)   Body mass index is 26.29 kg/m. Physical Exam  Constitutional: He is oriented to person, place, and time. He appears well-developed and well-nourished. No distress.  HENT:  Head: Normocephalic and atraumatic.  Neck: Neck supple.  Cardiovascular: Normal rate, regular rhythm, normal heart sounds and intact distal pulses.  Pulmonary/Chest: Effort normal and breath sounds normal. No respiratory distress.  Abdominal: Soft. Bowel sounds are normal. He exhibits no distension. There is no tenderness.  Musculoskeletal: Normal range of motion.  Neurological: He is alert and oriented to person, place, and time.  Bell's palsy chronic  Skin: Skin is warm and dry.  Psychiatric: He has a normal mood and affect. His behavior is normal. Judgment and  thought content normal.    Labs reviewed: Basic Metabolic Panel: Recent Labs    06/16/16 0833 03/11/17 0944  NA 138 139  K 4.4 3.9  CL 105 104  CO2 26 27  GLUCOSE 88 87  BUN 20 23  CREATININE 1.10 1.17*  CALCIUM 9.0 9.1  TSH 3.26 2.86   Liver Function Tests: Recent Labs    03/11/17 0944  AST 17  ALT 12  BILITOT 0.7  PROT 6.2   No results for input(s): LIPASE, AMYLASE in the last 8760 hours. No results for input(s): AMMONIA in the last 8760 hours. CBC: No results for input(s): WBC, NEUTROABS, HGB, HCT, MCV, PLT in the last 8760 hours. Lipid Panel: Recent Labs    06/16/16 0833 03/11/17 0944  CHOL 141 155  HDL 52 56  LDLCALC 76  --   TRIG 67 81  CHOLHDL 2.7 2.8   Lab Results  Component Value Date   HGBA1C 5.7 (H) 03/11/2017    Assessment/Plan 1. Essential hypertension - bp at goal -cont current regimen - CBC with Differential/Platelet; Future - COMPLETE METABOLIC PANEL WITH GFR; Future  2. Type 2 diabetes mellitus with stage 3 chronic kidney disease, without long-term current use of insulin (HCC) -well controlled with stable renal function, cont glipizide and arb, statin - Hemoglobin A1c; Future - Lipid panel; Future -foot exam at physical -had eye exam with Dr. Ellie Lunch w/o retinopathy  3. Hypothyroidism, unspecified type -cont levothyroxine, tsh at goal - TSH; Future  4. Hyperlipidemia, unspecified hyperlipidemia type -lipids satisfactory for 84 yo on zocor which he's tolerating - Lipid panel; Future  5. Bell's palsy -with ramsay-hunt syndrome, says it went from one side to the next -got shingrix vaccines at pharmacy  6. Primary osteoarthritis of right hip -cont rare ibuprofen use when bothersome  Labs/tests ordered:   Orders Placed This Encounter  Procedures  . CBC with Differential/Platelet    Standing Status:   Future    Standing Expiration Date:   03/17/2018  . COMPLETE METABOLIC PANEL WITH GFR    Standing Status:   Future     Standing Expiration Date:   03/17/2018  . Hemoglobin A1c    Standing Status:   Future    Standing Expiration Date:   03/17/2018  . Lipid panel    Standing Status:   Future    Standing  Expiration Date:   03/17/2018  . TSH    Standing Status:   Future    Standing Expiration Date:   03/17/2018   Next appt:  6 mos for CPE, labs before  Elijah Michaelis L. Marqui Formby, D.O. [facility] [address], [place] [address] Cell Phone (Mon-Fri 8am-5pm):  [phone] On Call:  [phone] & follow prompts after 5pm & weekends Office Phone:  [phone] Office Fax:  [phone]

## 2017-04-02 ENCOUNTER — Other Ambulatory Visit: Payer: Self-pay | Admitting: Internal Medicine

## 2017-04-05 ENCOUNTER — Other Ambulatory Visit: Payer: Self-pay | Admitting: Internal Medicine

## 2017-04-05 NOTE — Telephone Encounter (Signed)
A refill request was received from the pharmacy for alprazolam 0.25 mg tablets. Rx was called in to pharmacy after verifying last fill date, provider, and quantity on PMP [place].

## 2017-04-06 ENCOUNTER — Other Ambulatory Visit: Payer: Self-pay | Admitting: Internal Medicine

## 2017-04-07 ENCOUNTER — Other Ambulatory Visit: Payer: Self-pay | Admitting: *Deleted

## 2017-04-07 MED ORDER — TEMAZEPAM 15 MG PO CAPS: ORAL_CAPSULE | ORAL | 0 refills | Status: DC

## 2017-04-07 NOTE — Telephone Encounter (Signed)
Patient requested. Phoned to pharmacy.  

## 2017-04-22 ENCOUNTER — Other Ambulatory Visit: Payer: Self-pay | Admitting: Internal Medicine

## 2017-04-28 ENCOUNTER — Other Ambulatory Visit: Payer: Self-pay | Admitting: Internal Medicine

## 2017-05-03 ENCOUNTER — Other Ambulatory Visit: Payer: Self-pay | Admitting: Internal Medicine

## 2017-05-03 ENCOUNTER — Other Ambulatory Visit: Payer: Self-pay | Admitting: *Deleted

## 2017-05-03 MED ORDER — ONETOUCH DELICA LANCETS 33G MISC: 5 refills | Status: DC

## 2017-05-03 MED ORDER — LOSARTAN POTASSIUM-HCTZ 100-25 MG PO TABS: ORAL_TABLET | ORAL | 0 refills | Status: DC

## 2017-05-03 MED ORDER — TEMAZEPAM 15 MG PO CAPS: ORAL_CAPSULE | ORAL | 3 refills | Status: DC

## 2017-05-03 NOTE — Telephone Encounter (Signed)
Patient requested. faxed

## 2017-05-26 ENCOUNTER — Other Ambulatory Visit: Payer: Self-pay | Admitting: Internal Medicine

## 2017-05-30 ENCOUNTER — Ambulatory Visit (INDEPENDENT_AMBULATORY_CARE_PROVIDER_SITE_OTHER): Payer: Medicare Other

## 2017-05-30 VITALS — BP 132/80 | HR 54 | Temp 97.8°F | Ht 65.0 in | Wt 159.0 lb

## 2017-05-30 DIAGNOSIS — Z0000 Encounter for general adult medical examination without abnormal findings: Secondary | ICD-10-CM | POA: Diagnosis not present

## 2017-05-30 NOTE — Patient Instructions (Addendum)
[name] , Thank you for taking time to come for your Medicare Wellness Visit. I appreciate your ongoing commitment to your health goals. Please review the following plan we discussed and let me know if I can assist you in the future.   Screening recommendations/referrals: Colonoscopy excluded, you are over age 82 Recommended yearly ophthalmology/optometry visit for glaucoma screening and checkup Recommended yearly dental visit for hygiene and checkup  Vaccinations: Influenza vaccine up to date, due 2019 fall season Pneumococcal vaccine up to date, completed Tdap vaccine up to date, due 03/06/2023 Shingles vaccine up to date, completed    Advanced directives: please fill out your living will and health care power of attorney and bring [place] a copy  Conditions/risks identified: none  Next appointment: Labs 09/20/2017 @ 8:30am  Dr. Mariea Clonts 09/22/2017 @ 1:30pm  Tyson Dense, 09/25/2018 @ 1:45pm  Preventive Care 65 Years and Older, Male Preventive care refers to lifestyle choices and visits with your health care provider that can promote health and wellness. What does preventive care include?  A yearly physical exam. This is also called an annual well check.  Dental exams once or twice a year.  Routine eye exams. Ask your health care provider how often you should have your eyes checked.  Personal lifestyle choices, including:  Daily care of your teeth and gums.  Regular physical activity.  Eating a healthy diet.  Avoiding tobacco and drug use.  Limiting alcohol use.  Practicing safe sex.  Taking low doses of aspirin every day.  Taking vitamin and mineral supplements as recommended by your health care provider. What happens during an annual well check? The services and screenings done by your health care provider during your annual well check will depend on your age, overall health, lifestyle risk factors, and family history of disease. Counseling  Your health care provider  may ask you questions about your:  Alcohol use.  Tobacco use.  Drug use.  Emotional well-being.  Home and relationship well-being.  Sexual activity.  Eating habits.  History of falls.  Memory and ability to understand (cognition).  Work and work Statistician. Screening  You may have the following tests or measurements:  Height, weight, and BMI.  Blood pressure.  Lipid and cholesterol levels. These may be checked every 5 years, or more frequently if you are over 76 years old.  Skin check.  Lung cancer screening. You may have this screening every year starting at age 36 if you have a 30-pack-year history of smoking and currently smoke or have quit within the past 15 years.  Fecal occult blood test (FOBT) of the stool. You may have this test every year starting at age 8.  Flexible sigmoidoscopy or colonoscopy. You may have a sigmoidoscopy every 5 years or a colonoscopy every 10 years starting at age 33.  Prostate cancer screening. Recommendations will vary depending on your family history and other risks.  Hepatitis C blood test.  Hepatitis B blood test.  Sexually transmitted disease (STD) testing.  Diabetes screening. This is done by checking your blood sugar (glucose) after you have not eaten for a while (fasting). You may have this done every 1-3 years.  Abdominal aortic aneurysm (AAA) screening. You may need this if you are a current or former smoker.  Osteoporosis. You may be screened starting at age 16 if you are at high risk. Talk with your health care provider about your test results, treatment options, and if necessary, the need for more tests. Vaccines  Your health  care provider may recommend certain vaccines, such as:  Influenza vaccine. This is recommended every year.  Tetanus, diphtheria, and acellular pertussis (Tdap, Td) vaccine. You may need a Td booster every 10 years.  Zoster vaccine. You may need this after age 23.  Pneumococcal 13-valent  conjugate (PCV13) vaccine. One dose is recommended after age 79.  Pneumococcal polysaccharide (PPSV23) vaccine. One dose is recommended after age 70. Talk to your health care provider about which screenings and vaccines you need and how often you need them. This information is not intended to replace advice given to you by your health care provider. Make sure you discuss any questions you have with your health care provider. Document Released: 03/28/2015 Document Revised: 11/19/2015 Document Reviewed: 12/31/2014 Elsevier Interactive Patient Education  2017 [place] Prevention in the Home Falls can cause injuries. They can happen to people of all ages. There are many things you can do to make your home safe and to help prevent falls. What can I do on the outside of my home?  Regularly fix the edges of walkways and driveways and fix any cracks.  Remove anything that might make you trip as you walk through a door, such as a raised step or threshold.  Trim any bushes or trees on the path to your home.  Use bright outdoor lighting.  Clear any walking paths of anything that might make someone trip, such as rocks or tools.  Regularly check to see if handrails are loose or broken. Make sure that both sides of any steps have handrails.  Any raised decks and porches should have guardrails on the edges.  Have any leaves, snow, or ice cleared regularly.  Use sand or salt on walking paths during winter.  Clean up any spills in your garage right away. This includes oil or grease spills. What can I do in the bathroom?  Use night lights.  Install grab bars by the toilet and in the tub and shower. Do not use towel bars as grab bars.  Use non-skid mats or decals in the tub or shower.  If you need to sit down in the shower, use a plastic, non-slip stool.  Keep the floor dry. Clean up any water that spills on the floor as soon as it happens.  Remove soap buildup in the tub or  shower regularly.  Attach bath mats securely with double-sided non-slip rug tape.  Do not have throw rugs and other things on the floor that can make you trip. What can I do in the bedroom?  Use night lights.  Make sure that you have a light by your bed that is easy to reach.  Do not use any sheets or blankets that are too big for your bed. They should not hang down onto the floor.  Have a firm chair that has side arms. You can use this for support while you get dressed.  Do not have throw rugs and other things on the floor that can make you trip. What can I do in the kitchen?  Clean up any spills right away.  Avoid walking on wet floors.  Keep items that you use a lot in easy-to-reach places.  If you need to reach something above you, use a strong step stool that has a grab bar.  Keep electrical cords out of the way.  Do not use floor polish or wax that makes floors slippery. If you must use wax, use non-skid floor wax.  Do not have  throw rugs and other things on the floor that can make you trip. What can I do with my stairs?  Do not leave any items on the stairs.  Make sure that there are handrails on both sides of the stairs and use them. Fix handrails that are broken or loose. Make sure that handrails are as long as the stairways.  Check any carpeting to make sure that it is firmly attached to the stairs. Fix any carpet that is loose or worn.  Avoid having throw rugs at the top or bottom of the stairs. If you do have throw rugs, attach them to the floor with carpet tape.  Make sure that you have a light switch at the top of the stairs and the bottom of the stairs. If you do not have them, ask someone to add them for you. What else can I do to help prevent falls?  Wear shoes that:  Do not have high heels.  Have rubber bottoms.  Are comfortable and fit you well.  Are closed at the toe. Do not wear sandals.  If you use a stepladder:  Make sure that it is fully  opened. Do not climb a closed stepladder.  Make sure that both sides of the stepladder are locked into place.  Ask someone to hold it for you, if possible.  Clearly mark and make sure that you can see:  Any grab bars or handrails.  First and last steps.  Where the edge of each step is.  Use tools that help you move around (mobility aids) if they are needed. These include:  Canes.  Walkers.  Scooters.  Crutches.  Turn on the lights when you go into a dark area. Replace any light bulbs as soon as they burn out.  Set up your furniture so you have a clear path. Avoid moving your furniture around.  If any of your floors are uneven, fix them.  If there are any pets around you, be aware of where they are.  Review your medicines with your doctor. Some medicines can make you feel dizzy. This can increase your chance of falling. Ask your doctor what other things that you can do to help prevent falls. This information is not intended to replace advice given to you by your health care provider. Make sure you discuss any questions you have with your health care provider. Document Released: 12/26/2008 Document Revised: 08/07/2015 Document Reviewed: 04/05/2014 Elsevier Interactive Patient Education  2017 [employer].

## 2017-05-30 NOTE — Progress Notes (Signed)
Subjective:   Bobby Jensen is a 84 y.o. male who presents for Medicare Annual/Subsequent preventive examination.  Last AWV- 03/23/2016    Objective:    Vitals: BP 132/80 (BP Location: Left Arm, Patient Position: Sitting)   Pulse (!) 54   Temp 97.8 F (36.6 C) (Oral)   Ht 5\' 5"  (1.651 m)   Wt 159 lb (72.1 kg)   SpO2 97%   BMI 26.46 kg/m   Body mass index is 26.46 kg/m.  Advanced Directives 05/30/2017 03/23/2016 12/24/2015 07/30/2015 04/01/2015  Does Patient Have a Medical Advance Directive? No Yes Yes Yes No  Type of Advance Directive - Living will Living will Living will -  Copy of [place] in Chart? - - No - copy requested No - copy requested -  Would patient like information on creating a medical advance directive? Yes (MAU/Ambulatory/Procedural Areas - Information given) - - - -    Tobacco Social History   Tobacco Use  Smoking Status Former Smoker  . Last attempt to quit: 07/31/1978  . Years since quitting: 38.8  Smokeless Tobacco Never Used     Counseling given: Not Answered   Clinical Intake:  Pre-visit preparation completed: No  Pain : No/denies pain     Nutritional Risks: None Diabetes: No  How often do you need to have someone help you when you read instructions, pamphlets, or other written materials from your doctor or pharmacy?: 1 - Never What is the last grade level you completed in school?: College  Interpreter Needed?: No  Information entered by :: Bobby Dense, RN  Past Medical History:  Diagnosis Date  . Bladder neck obstruction 03/28/2008  . Contact with or exposure to tuberculosis 03/15/1992  . Elevated prostate specific antigen (PSA) 03/15/1998  . Gastric ulcer, unspecified as acute or chronic, without mention of hemorrhage, perforation, or obstruction 03/15/1993  . Hypertension 07/08/1998  . Hypertrophy of prostate without urinary obstruction and other lower urinary tract symptoms (LUTS) 03/15/1989  . 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 Inguinal hernia without mention of obstruction or gangrene, unilateral or unspecified, (not specified as recurrent) 11/20/2008  . Insomnia, unspecified 11/01/2003  . Kidney calculi 08/20/2009  . Other and unspecified hyperlipidemia 12/23/2004  . Other malaise and fatigue 12/23/2004  . Pain in joint, pelvic region and thigh 12/17/2009  . Pain in joint, shoulder region 11/01/2003  . Peptic ulcer, unspecified site, unspecified as acute or chronic, without mention of hemorrhage, perforation, or obstruction 06/25/2003  . Personal history of malaria 03/16/1939  . Rosacea 08/20/2009  . Type II or unspecified type diabetes mellitus without mention of complication, not stated as uncontrolled 07/15/2006  . Unspecified constipation 08/05/2010  . Unspecified hypothyroidism 12/17/2009   Past Surgical History:  Procedure Laterality Date  . APPENDECTOMY  1956  . HEMORROIDECTOMY  06/2002   Dr. Lennie Jensen  . HERNIA REPAIR Bilateral 11/2008   inguinal  . PILONIDAL CYST EXCISION  1963  . PROSTATE SURGERY    . TOTAL HIP ARTHROPLASTY Right 07/10/2009   Dr. Telford Jensen   Family History  Problem Relation Age of Onset  . Heart disease Father    Social History   Socioeconomic History  . Marital status: Married    Spouse name: None  . Number of children: None  . Years of education: None  . Highest education level: None  Social Needs  . Financial resource strain: Not hard at all  . Food insecurity - worry: Never true  . Food  insecurity - inability: Never true  . Transportation needs - medical: No  . Transportation needs - non-medical: No  Occupational History  . None  Tobacco Use  . Smoking status: Former Smoker    Last attempt to quit: 07/31/1978    Years since quitting: 38.8  . Smokeless tobacco: Never Used  Substance and Sexual Activity  . Alcohol use: Yes    Comment: Occ. glass of wine  . Drug use: No  . Sexual activity: Yes    Partners: Female  Other  Topics Concern  . None  Social History Narrative  . None    Outpatient Encounter Medications as of 05/30/2017  Medication Sig  . ALPRAZolam (XANAX) 0.25 MG tablet TAKE 1 TABLET BY MOUTH UP TO THREE TIMES DAILY  . aspirin 81 MG tablet Take 81 mg by mouth daily.  . Carboxymethylcellul-Glycerin (REFRESH OPTIVE OP) Place 1 drop into the left eye daily.   . Cholecalciferol (VITAMIN D PO) Take 1,000 Units by mouth daily.   Marland Kitchen glimepiride (AMARYL) 1 MG tablet TAKE 1 TABLET BY MOUTH ONCE DAILY  . levothyroxine (SYNTHROID, LEVOTHROID) 50 MCG tablet TAKE 1 TABLET BY MOUTH EVERY DAY FOR THYROID  . losartan-hydrochlorothiazide (HYZAAR) 100-25 MG tablet TAKE 1/2 TABLET BY MOUTH DAILY FOR BLOOD PRESSURE  . nitroGLYCERIN (NITROSTAT) 0.4 MG SL tablet Place 1 tablet (0.4 mg total) under the tongue every 5 (five) minutes as needed for chest pain.  . ONE TOUCH ULTRA TEST test strip CHECK BLOOD SUGAR ONCE DAILY AS DIRECTED.  Marland Kitchen ONETOUCH DELICA LANCETS 31S MISC Check blood sugar once daily as directed E11.22  . simvastatin (ZOCOR) 20 MG tablet TAKE 1 TABLET BY MOUTH EVERY NIGHT AT BEDTIME  . temazepam (RESTORIL) 15 MG capsule Take one capsule by mouth at bedtime as needed for sleep  . triamcinolone cream (KENALOG) 0.1 % APPLY TO RASH TWICE DAILY AS NEEDED   No facility-administered encounter medications on file as of 05/30/2017.     Activities of Daily Living In your present state of health, do you have any difficulty performing the following activities: 05/30/2017  Hearing? Y  Comment due to bells palsey  Vision? Y  Difficulty concentrating or making decisions? N  Walking or climbing stairs? N  Dressing or bathing? N  Doing errands, shopping? N  Preparing Food and eating ? N  Using the Toilet? N  In the past six months, have you accidently leaked urine? Y  Do you have problems with loss of bowel control? N  Managing your Medications? N  Managing your Finances? N  Housekeeping or managing your  Housekeeping? N  Some recent data might be hidden    Patient Care Team: Bobby Curry, DO as PCP - General (Geriatric Medicine) Bobby Mutter, MD as Consulting Physician (Ophthalmology)   Assessment:   This is a routine wellness examination for Bobby Jensen.  Exercise Activities and Dietary recommendations Current Exercise Habits: The patient does not participate in regular exercise at present, Exercise limited by: None identified  Goals    None      Fall Risk Fall Risk  05/30/2017 03/17/2017 06/04/2016 03/23/2016 12/24/2015  Falls in the past year? No No No No No   Is the patient's home free of loose throw rugs in walkways, pet beds, electrical cords, etc?   yes      Grab bars in the bathroom? yes      Handrails on the stairs?   yes      Adequate lighting?   yes  Depression Screen PHQ 2/9 Scores 05/30/2017 03/17/2017 03/23/2016 12/24/2015  PHQ - 2 Score 0 0 0 0    Cognitive Function MMSE - Mini Mental State Exam 05/30/2017 03/23/2016 07/30/2015  Not completed: - (No Data) -  Orientation to time 5 - 5  Orientation to Place 5 - 5  Registration 3 - 3  Attention/ Calculation 5 - 5  Recall 2 - 2  Language- name 2 objects 2 - 2  Language- repeat 1 - 1  Language- follow 3 step command 3 - 3  Language- read & follow direction 1 - 1  Write a sentence 1 - 1  Copy design 1 - 1  Total score 29 - 29        Immunization History  Administered Date(s) Administered  . Influenza Split 12/28/2011  . Influenza,inj,Quad PF,6+ Mos 04/01/2015, 12/24/2015, 01/05/2017  . Influenza-Unspecified 01/14/2013, 12/10/2013, 12/03/2014  . Pneumococcal Conjugate-13 03/23/2016  . Pneumococcal-Unspecified 02/15/1992  . Td 03/15/1990  . Tdap 03/05/2013  . Zoster Recombinat (Shingrix) 06/30/2016, 03/11/2017    Qualifies for Shingles Vaccine? No, up to date  Screening Tests Health Maintenance  Topic Date Due  . FOOT EXAM  05/22/2015  . PNA vac Low Risk Adult (2 of 2 - PPSV23) 03/23/2017  .  HEMOGLOBIN A1C  09/09/2017  . OPHTHALMOLOGY EXAM  01/01/2018  . TETANUS/TDAP  03/06/2023  . INFLUENZA VACCINE  Completed   Cancer Screenings: Lung: Low Dose CT Chest recommended if Age 82-80 years, 30 pack-year currently smoking OR have quit w/in 15years. Patient does not qualify. Colorectal: up to date  Additional Screenings:  Hepatitis C Screening: declined    Plan:    I have personally reviewed and addressed the Medicare Annual Wellness questionnaire and have noted the following in the patient's chart:  A. Medical and social history B. Use of alcohol, tobacco or illicit drugs  C. Current medications and supplements D. Functional ability and status E.  Nutritional status F.  Physical activity G. Advance directives H. List of other physicians I.  Hospitalizations, surgeries, and ER visits in previous 12 months J.  [place] to include hearing, vision, cognitive, depression L. Referrals and appointments - none  In addition, I have reviewed and discussed with patient certain preventive protocols, quality metrics, and best practice recommendations. A written personalized care plan for preventive services as well as general preventive health recommendations were provided to patient.  See attached scanned questionnaire for additional information.   Signed,   Bobby Dense, RN Nurse Health Advisor  Patient concerns: None

## 2017-06-15 ENCOUNTER — Other Ambulatory Visit: Payer: Self-pay | Admitting: Internal Medicine

## 2017-06-24 ENCOUNTER — Other Ambulatory Visit: Payer: Self-pay | Admitting: Internal Medicine

## 2017-07-06 ENCOUNTER — Other Ambulatory Visit: Payer: Self-pay | Admitting: Internal Medicine

## 2017-07-07 NOTE — Telephone Encounter (Signed)
A medication refill was received from pharmacy for alprazolam 0.25 mg and kenalog 0.1% cream. Rx was called in to pharmacy after verifying last fill date, provider, and quantity on PMP [place] database.

## 2017-07-27 ENCOUNTER — Other Ambulatory Visit: Payer: Self-pay | Admitting: Internal Medicine

## 2017-07-30 ENCOUNTER — Other Ambulatory Visit: Payer: Self-pay | Admitting: Internal Medicine

## 2017-08-27 ENCOUNTER — Other Ambulatory Visit: Payer: Self-pay | Admitting: Internal Medicine

## 2017-08-30 ENCOUNTER — Other Ambulatory Visit: Payer: Self-pay | Admitting: Internal Medicine

## 2017-09-01 ENCOUNTER — Other Ambulatory Visit: Payer: Self-pay | Admitting: Internal Medicine

## 2017-09-02 ENCOUNTER — Other Ambulatory Visit: Payer: Self-pay | Admitting: Internal Medicine

## 2017-09-03 ENCOUNTER — Other Ambulatory Visit: Payer: Self-pay | Admitting: Internal Medicine

## 2017-09-05 NOTE — Telephone Encounter (Signed)
rx called into pharmacy

## 2017-09-19 ENCOUNTER — Other Ambulatory Visit: Payer: Medicare Other

## 2017-09-19 DIAGNOSIS — H02834 Dermatochalasis of left upper eyelid: Secondary | ICD-10-CM | POA: Diagnosis not present

## 2017-09-19 DIAGNOSIS — I10 Essential (primary) hypertension: Secondary | ICD-10-CM

## 2017-09-19 DIAGNOSIS — E1122 Type 2 diabetes mellitus with diabetic chronic kidney disease: Secondary | ICD-10-CM | POA: Diagnosis not present

## 2017-09-19 DIAGNOSIS — E785 Hyperlipidemia, unspecified: Secondary | ICD-10-CM

## 2017-09-19 DIAGNOSIS — N183 Chronic kidney disease, stage 3 unspecified: Secondary | ICD-10-CM

## 2017-09-19 DIAGNOSIS — G514 Facial myokymia: Secondary | ICD-10-CM | POA: Diagnosis not present

## 2017-09-19 DIAGNOSIS — E039 Hypothyroidism, unspecified: Secondary | ICD-10-CM

## 2017-09-19 DIAGNOSIS — G245 Blepharospasm: Secondary | ICD-10-CM | POA: Diagnosis not present

## 2017-09-20 ENCOUNTER — Other Ambulatory Visit: Payer: Medicare Other

## 2017-09-20 LAB — CBC WITH DIFFERENTIAL/PLATELET
Basophils Absolute: 33 cells/uL (ref 0–200)
Basophils Relative: 0.5 %
Eosinophils Absolute: 409 cells/uL (ref 15–500)
Eosinophils Relative: 6.2 %
HCT: 43.7 % (ref 38.5–50.0)
Hemoglobin: 14.8 g/dL (ref 13.2–17.1)
Lymphs Abs: 1769 cells/uL (ref 850–3900)
MCH: 30.3 pg (ref 27.0–33.0)
MCHC: 33.9 g/dL (ref 32.0–36.0)
MCV: 89.4 fL (ref 80.0–100.0)
MPV: 10.9 fL (ref 7.5–12.5)
Monocytes Relative: 8.8 %
Neutro Abs: 3808 cells/uL (ref 1500–7800)
Neutrophils Relative %: 57.7 %
Platelets: 165 10*3/uL (ref 140–400)
RBC: 4.89 10*6/uL (ref 4.20–5.80)
RDW: 13.2 % (ref 11.0–15.0)
Total Lymphocyte: 26.8 %
WBC mixed population: 581 cells/uL (ref 200–950)
WBC: 6.6 10*3/uL (ref 3.8–10.8)

## 2017-09-20 LAB — COMPLETE METABOLIC PANEL WITH GFR
AG Ratio: 1.8 (calc) (ref 1.0–2.5)
ALT: 12 U/L (ref 9–46)
AST: 15 U/L (ref 10–35)
Albumin: 4.3 g/dL (ref 3.6–5.1)
Alkaline phosphatase (APISO): 55 U/L (ref 40–115)
BUN/Creatinine Ratio: 22 (calc) (ref 6–22)
BUN: 26 mg/dL — ABNORMAL HIGH (ref 7–25)
CO2: 25 mmol/L (ref 20–32)
Calcium: 9.3 mg/dL (ref 8.6–10.3)
Chloride: 106 mmol/L (ref 98–110)
Creat: 1.18 mg/dL — ABNORMAL HIGH (ref 0.70–1.11)
GFR, Est African American: 64 mL/min/{1.73_m2} (ref >=60)
GFR, Est Non African American: 55 mL/min/{1.73_m2} — ABNORMAL LOW (ref >=60)
Globulin: 2.4 g/dL (calc) (ref 1.9–3.7)
Glucose, Bld: 88 mg/dL (ref 65–99)
Potassium: 4.3 mmol/L (ref 3.5–5.3)
Sodium: 140 mmol/L (ref 135–146)
Total Bilirubin: 0.7 mg/dL (ref 0.2–1.2)
Total Protein: 6.7 g/dL (ref 6.1–8.1)

## 2017-09-20 LAB — LIPID PANEL
Cholesterol: 147 mg/dL (ref <200)
HDL: 51 mg/dL (ref >40)
LDL Cholesterol (Calc): 80 mg/dL (calc)
Non-HDL Cholesterol (Calc): 96 mg/dL (calc) (ref <130)
Total CHOL/HDL Ratio: 2.9 (calc) (ref <5.0)
Triglycerides: 77 mg/dL (ref <150)

## 2017-09-20 LAB — HEMOGLOBIN A1C
Hgb A1c MFr Bld: 5.7 % of total Hgb — ABNORMAL HIGH (ref <5.7)
Mean Plasma Glucose: 117 (calc)
eAG (mmol/L): 6.5 (calc)

## 2017-09-20 LAB — TSH: TSH: 3.07 mIU/L (ref 0.40–4.50)

## 2017-09-22 ENCOUNTER — Encounter: Payer: Medicare Other | Admitting: Internal Medicine

## 2017-10-02 ENCOUNTER — Other Ambulatory Visit: Payer: Self-pay | Admitting: Internal Medicine

## 2017-10-03 MED ORDER — TEMAZEPAM 15 MG PO CAPS: ORAL_CAPSULE | ORAL | 0 refills | Status: DC

## 2017-10-03 NOTE — Addendum Note (Signed)
Addended by: Despina Hidden on: 10/03/2017 09:07 AM   Modules accepted: Orders

## 2017-10-19 ENCOUNTER — Other Ambulatory Visit: Payer: Self-pay | Admitting: Internal Medicine

## 2017-10-19 NOTE — Telephone Encounter (Signed)
A medication refill was received from pharmacy for alprazolam 0.25 mg. Rx was called in to pharmacy after verifying last fill date, provider, and quantity on PMP [place] database.   

## 2017-10-26 DIAGNOSIS — G514 Facial myokymia: Secondary | ICD-10-CM | POA: Diagnosis not present

## 2017-10-26 DIAGNOSIS — G245 Blepharospasm: Secondary | ICD-10-CM | POA: Diagnosis not present

## 2017-10-26 DIAGNOSIS — H02831 Dermatochalasis of right upper eyelid: Secondary | ICD-10-CM | POA: Diagnosis not present

## 2017-10-26 DIAGNOSIS — H02834 Dermatochalasis of left upper eyelid: Secondary | ICD-10-CM | POA: Diagnosis not present

## 2017-10-26 DIAGNOSIS — G519 Disorder of facial nerve, unspecified: Secondary | ICD-10-CM | POA: Diagnosis not present

## 2017-10-27 ENCOUNTER — Encounter: Payer: Self-pay | Admitting: Internal Medicine

## 2017-10-27 ENCOUNTER — Ambulatory Visit (INDEPENDENT_AMBULATORY_CARE_PROVIDER_SITE_OTHER): Payer: Medicare Other | Admitting: Internal Medicine

## 2017-10-27 VITALS — BP 120/60 | HR 50 | Temp 98.0°F | Ht 65.0 in | Wt 160.0 lb

## 2017-10-27 DIAGNOSIS — Z0000 Encounter for general adult medical examination without abnormal findings: Secondary | ICD-10-CM | POA: Diagnosis not present

## 2017-10-27 DIAGNOSIS — E039 Hypothyroidism, unspecified: Secondary | ICD-10-CM | POA: Diagnosis not present

## 2017-10-27 DIAGNOSIS — E1122 Type 2 diabetes mellitus with diabetic chronic kidney disease: Secondary | ICD-10-CM

## 2017-10-27 DIAGNOSIS — I10 Essential (primary) hypertension: Secondary | ICD-10-CM

## 2017-10-27 DIAGNOSIS — N183 Chronic kidney disease, stage 3 unspecified: Secondary | ICD-10-CM

## 2017-10-27 DIAGNOSIS — E785 Hyperlipidemia, unspecified: Secondary | ICD-10-CM

## 2017-10-27 DIAGNOSIS — G4709 Other insomnia: Secondary | ICD-10-CM

## 2017-10-27 DIAGNOSIS — G510 Bell's palsy: Secondary | ICD-10-CM

## 2017-10-27 MED ORDER — LEVOTHYROXINE SODIUM 50 MCG PO TABS: 50.0000 ug | ORAL_TABLET | Freq: Every day | ORAL | 3 refills | Status: DC

## 2017-10-27 MED ORDER — SIMVASTATIN 20 MG PO TABS: 20.0000 mg | ORAL_TABLET | Freq: Every day | ORAL | 3 refills | Status: DC

## 2017-10-27 MED ORDER — GLIMEPIRIDE 1 MG PO TABS: 1.0000 mg | ORAL_TABLET | Freq: Every day | ORAL | 3 refills | Status: DC

## 2017-10-27 MED ORDER — TEMAZEPAM 15 MG PO CAPS: ORAL_CAPSULE | ORAL | 3 refills | Status: DC

## 2017-10-27 MED ORDER — ALPRAZOLAM 0.25 MG PO TABS: 0.2500 mg | ORAL_TABLET | Freq: Every evening | ORAL | 1 refills | Status: DC | PRN

## 2017-10-27 NOTE — Patient Instructions (Signed)
Call us in October to see if you would like to get your flu shot in our flu clinic.

## 2017-10-27 NOTE — Progress Notes (Signed)
Location:  [facility] clinic Provider:  Briseis Aguilera L. Mariea Clonts, D.O., C.M.D.  Goals of Care:  Advanced Directives 05/30/2017  Does Patient Have a Medical Advance Directive? No  Type of Advance Directive -  Copy of [place] in Chart? -  Would patient like information on creating a medical advance directive? Yes (MAU/Ambulatory/Procedural Areas - Information given)    Chief Complaint  Patient presents with  . Annual Exam    annual exam    HPI: Patient is a 84 y.o. male seen today for medical management of chronic diseases / extended visit.      Says he eats liberally, but hba1c down to 5.7.  He takes his glimepiride.    Has been taking injections from the oculofacial surgeon.  Had 5 injections yesterday with xeomin to see if they can relax the muscle of his face.  He feels so badly since his Bell's palsy never went away.  He finds it demeaning.  He's always been particular about his looks.  His eye stays wet when he talks and eats.    He's going to [place] end of Sept for two weeks.  His wife has had a fall and hurt coccyx.  He's going to visit his sister.    Past Medical History:  Diagnosis Date  . Bladder neck obstruction 03/28/2008  . Contact with or exposure to tuberculosis 03/15/1992  . Elevated prostate specific antigen (PSA) 03/15/1998  . Gastric ulcer, unspecified as acute or chronic, without mention of hemorrhage, perforation, or obstruction 03/15/1993  . Hypertension 07/08/1998  . Hypertrophy of prostate without urinary obstruction and other lower urinary tract symptoms (LUTS) 03/15/1989  . 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 Inguinal hernia without mention of obstruction or gangrene, unilateral or unspecified, (not specified as recurrent) 11/20/2008  . Insomnia, unspecified 11/01/2003  . Kidney calculi 08/20/2009  . Other and unspecified hyperlipidemia 12/23/2004  . Other malaise and fatigue 12/23/2004  . Pain in joint, pelvic region and thigh 12/17/2009    . Pain in joint, shoulder region 11/01/2003  . Peptic ulcer, unspecified site, unspecified as acute or chronic, without mention of hemorrhage, perforation, or obstruction 06/25/2003  . Personal history of malaria 03/16/1939  . Rosacea 08/20/2009  . Type II or unspecified type diabetes mellitus without mention of complication, not stated as uncontrolled 07/15/2006  . Unspecified constipation 08/05/2010  . Unspecified hypothyroidism 12/17/2009    Past Surgical History:  Procedure Laterality Date  . APPENDECTOMY  1956  . HEMORROIDECTOMY  06/2002   Dr. Lennie Hummer  . HERNIA REPAIR Bilateral 11/2008   inguinal  . PILONIDAL CYST EXCISION  1963  . PROSTATE SURGERY    . TOTAL HIP ARTHROPLASTY Right 07/10/2009   Dr. Telford Nab    Allergies  Allergen Reactions  . Fioricet [Butalbital-Apap-Caffeine]     Unaware of this allergy per patient.   . Other Other (See Comments)    Profen II DM= urinary outlet obstruction Unaware of this allergy per patient.  [name] [Guaifenesin]     Unaware of this allergy per patient.    Outpatient Encounter Medications as of 10/27/2017  Medication Sig  . ALPRAZolam (XANAX) 0.25 MG tablet Take 1 tablet (0.25 mg total) by mouth at bedtime as needed for anxiety.  Marland Kitchen aspirin 81 MG tablet Take 81 mg by mouth daily.  . Cholecalciferol (VITAMIN D PO) Take 1,000 Units by mouth daily.   Marland Kitchen glimepiride (AMARYL) 1 MG tablet Take 1 tablet (1 mg total) by mouth daily.  Marland Kitchen  glucose blood (ONE TOUCH ULTRA TEST) test strip Use to check blood sugar once daily and as directed. E11.22  . levothyroxine (SYNTHROID, LEVOTHROID) 50 MCG tablet Take 1 tablet (50 mcg total) by mouth daily before breakfast.  . nitroGLYCERIN (NITROSTAT) 0.4 MG SL tablet Place 1 tablet (0.4 mg total) under the tongue every 5 (five) minutes as needed for chest pain.  Glory Rosebush DELICA LANCETS 16X MISC Check blood sugar once daily as directed E11.22  . simvastatin (ZOCOR) 20 MG tablet Take 1 tablet (20 mg  total) by mouth at bedtime.  . temazepam (RESTORIL) 15 MG capsule TAKE ONE CAPSULE BY MOUTH AT BEDTIME AS NEEDED FOR SLEEP  . triamcinolone cream (KENALOG) 0.1 % APPLY EXTERNALLY TO RASH TWICE DAILY AS NEEDED  . [DISCONTINUED] ALPRAZolam (XANAX) 0.25 MG tablet TAKE 1 TABLET BY MOUTH UP TO THREE TIMES DAILY  . [DISCONTINUED] glimepiride (AMARYL) 1 MG tablet TAKE 1 TABLET BY MOUTH ONCE DAILY  . [DISCONTINUED] levothyroxine (SYNTHROID, LEVOTHROID) 50 MCG tablet TAKE 1 TABLET BY MOUTH EVERY DAY FOR THYROID  . [DISCONTINUED] simvastatin (ZOCOR) 20 MG tablet TAKE 1 TABLET BY MOUTH EVERY NIGHT AT BEDTIME  . [DISCONTINUED] temazepam (RESTORIL) 15 MG capsule TAKE ONE CAPSULE BY MOUTH AT BEDTIME AS NEEDED FOR SLEEP  . [DISCONTINUED] Carboxymethylcellul-Glycerin (REFRESH OPTIVE OP) Place 1 drop into the left eye daily.    No facility-administered encounter medications on file as of 10/27/2017.     Review of Systems:  Review of Systems  Constitutional: Negative for chills, fever and malaise/fatigue.  HENT: Negative for congestion and hearing loss.   Eyes: Negative for blurred vision.       Left watery eyes; glasses--given new rx, but has not filled it b/c still reading well with current one  Respiratory: Negative for shortness of breath.   Cardiovascular: Negative for chest pain, palpitations and leg swelling.  Gastrointestinal: Negative for abdominal pain, blood in stool, constipation, diarrhea, heartburn and melena.  Genitourinary: Negative for dysuria.       Up once to urinate only at night  Musculoskeletal: Negative for falls and joint pain.  Neurological: Negative for dizziness, loss of consciousness and headaches.  Endo/Heme/Allergies: Does not bruise/bleed easily.  Psychiatric/Behavioral: Negative for memory loss. The patient has insomnia.        Word-finding difficulty; sleeps but only with benzo that he's taken many years after lots of foreign travel    Health Maintenance  Topic Date Due    . FOOT EXAM  05/22/2015  . PNA vac Low Risk Adult (2 of 2 - PPSV23) 03/23/2017  . INFLUENZA VACCINE  10/13/2017  . OPHTHALMOLOGY EXAM  01/01/2018  . URINE MICROALBUMIN  03/11/2018  . HEMOGLOBIN A1C  03/22/2018  . TETANUS/TDAP  03/06/2023    Physical Exam: Vitals:   10/27/17 1328  BP: 120/60  Pulse: (!) 50  Temp: 98 F (36.7 C)  TempSrc: Oral  SpO2: 97%  Weight: 160 lb (72.6 kg)  Height: 5\' 5"  (1.651 m)   Body mass index is 26.63 kg/m. Physical Exam  Constitutional: He is oriented to person, place, and time. He appears well-developed and well-nourished. No distress.  HENT:  Head: Normocephalic and atraumatic.  Right Ear: External ear normal.  Left Ear: External ear normal.  Nose: Nose normal.  Mouth/Throat: Oropharynx is clear and moist. No oropharyngeal exudate.  Eyes: Pupils are equal, round, and reactive to light. Conjunctivae and EOM are normal.  Cardiovascular: Normal rate, regular rhythm, normal heart sounds and intact distal pulses.  Pulmonary/Chest:  Effort normal and breath sounds normal. No respiratory distress.  Abdominal: Soft. Bowel sounds are normal. He exhibits no distension and no mass. There is no tenderness. There is no rebound and no guarding.  Neurological: He is alert and oriented to person, place, and time. He displays normal reflexes. No sensory deficit. He exhibits normal muscle tone. Coordination normal.  Bell's palsy  Skin: Skin is warm and dry.    Labs reviewed: Basic Metabolic Panel: Recent Labs    03/11/17 0944 09/19/17 0840  NA 139 140  K 3.9 4.3  CL 104 106  CO2 27 25  GLUCOSE 87 88  BUN 23 26*  CREATININE 1.17* 1.18*  CALCIUM 9.1 9.3  TSH 2.86 3.07   Liver Function Tests: Recent Labs    03/11/17 0944 09/19/17 0840  AST 17 15  ALT 12 12  BILITOT 0.7 0.7  PROT 6.2 6.7   No results for input(s): LIPASE, AMYLASE in the last 8760 hours. No results for input(s): AMMONIA in the last 8760 hours. CBC: Recent Labs     09/19/17 0840  WBC 6.6  NEUTROABS 3,808  HGB 14.8  HCT 43.7  MCV 89.4  PLT 165   Lipid Panel: Recent Labs    03/11/17 0944 09/19/17 0840  CHOL 155 147  HDL 56 51  LDLCALC 83 80  TRIG 81 77  CHOLHDL 2.8 2.9   Lab Results  Component Value Date   HGBA1C 5.7 (H) 09/19/2017    Assessment/Plan 1. Annual physical exam -performed today, doing well, up to date on vaccines--too early for flu shot in his opinion--will call back when he is ready  2. Essential hypertension - bp well controlled, cont same regimen and monitor - Basic metabolic panel; Future  3. Type 2 diabetes mellitus with stage 3 chronic kidney disease, without long-term current use of insulin (HCC) - continue current regimen - simvastatin (ZOCOR) 20 MG tablet; Take 1 tablet (20 mg total) by mouth at bedtime.  Dispense: 90 tablet; Refill: 3 - glimepiride (AMARYL) 1 MG tablet; Take 1 tablet (1 mg total) by mouth daily.  Dispense: 90 tablet; Refill: 3 - Hemoglobin A1c; Future - Basic metabolic panel; Future - CBC with Differential/Platelet; Future  4. Hypothyroidism, unspecified type -TSH normal - levothyroxine (SYNTHROID, LEVOTHROID) 50 MCG tablet; Take 1 tablet (50 mcg total) by mouth daily before breakfast.  Dispense: 90 tablet; Refill: 3  5. Hyperlipidemia, unspecified hyperlipidemia type - LDL above goal, but HDL good and pt is 84 yo now -simvastatin (ZOCOR) 20 MG tablet; Take 1 tablet (20 mg total) by mouth at bedtime.  Dispense: 90 tablet; Refill: 3  6. Bell's palsy -ongoing, getting injections done to try to correct his palsy to some degree b/c he's terribly bothered by it  7. Other insomnia - ongoing, cont his regimen which he's dependent on at this point - temazepam (RESTORIL) 15 MG capsule; TAKE ONE CAPSULE BY MOUTH AT BEDTIME AS NEEDED FOR SLEEP  Dispense: 90 capsule; Refill: 3 - ALPRAZolam (XANAX) 0.25 MG tablet; Take 1 tablet (0.25 mg total) by mouth at bedtime as needed for anxiety.  Dispense:  90 tablet; Refill: 1  Labs/tests ordered:   Orders Placed This Encounter  Procedures  . Hemoglobin A1c    Standing Status:   Future    Standing Expiration Date:   10/28/2018  . Basic metabolic panel    Standing Status:   Future    Standing Expiration Date:   10/28/2018    Order Specific Question:  Has the patient fasted?    Answer:   Yes  . CBC with Differential/Platelet    Standing Status:   Future    Standing Expiration Date:   10/28/2018   Next appt:  6 mos med mgt, labs before   Jammie Clink L. Anthonny Schiller, D.O. [facility] [address], [place] [address] Cell Phone (Mon-Fri 8am-5pm):  [phone] On Call:  [phone] & follow prompts after 5pm & weekends Office Phone:  [phone] Office Fax:  [phone]

## 2017-11-02 ENCOUNTER — Other Ambulatory Visit: Payer: Self-pay | Admitting: Internal Medicine

## 2017-11-02 DIAGNOSIS — G4709 Other insomnia: Secondary | ICD-10-CM

## 2017-11-24 ENCOUNTER — Other Ambulatory Visit: Payer: Self-pay | Admitting: Internal Medicine

## 2017-11-28 DIAGNOSIS — G514 Facial myokymia: Secondary | ICD-10-CM | POA: Diagnosis not present

## 2017-11-28 DIAGNOSIS — H02834 Dermatochalasis of left upper eyelid: Secondary | ICD-10-CM | POA: Diagnosis not present

## 2017-11-28 DIAGNOSIS — H02831 Dermatochalasis of right upper eyelid: Secondary | ICD-10-CM | POA: Diagnosis not present

## 2017-11-28 DIAGNOSIS — G245 Blepharospasm: Secondary | ICD-10-CM | POA: Diagnosis not present

## 2017-12-01 ENCOUNTER — Other Ambulatory Visit: Payer: Self-pay | Admitting: Internal Medicine

## 2017-12-01 DIAGNOSIS — G4709 Other insomnia: Secondary | ICD-10-CM

## 2017-12-05 ENCOUNTER — Other Ambulatory Visit: Payer: Self-pay

## 2017-12-05 NOTE — Patient Outreach (Signed)
[facility]) Care Management  12/05/2017  Blaine T Haggar 03/25/1927 [id number]   Medication Adherence call to Mr. Cuinn Bernabei  spoke with patient he said he still has medication and  is only taking 1/2 tablet of Glimepiride 1 mg depending on his glucose  In the morning and on Simvastatin he will fill when finished with what he has. Mr. Boule is showing past due under [place].  [place] Management Direct Dial [phone]  Fax [phone] [email or web address]

## 2017-12-07 ENCOUNTER — Other Ambulatory Visit: Payer: Self-pay | Admitting: Internal Medicine

## 2017-12-09 ENCOUNTER — Telehealth: Payer: Self-pay | Admitting: *Deleted

## 2017-12-09 MED ORDER — HYDROCHLOROTHIAZIDE 25 MG PO TABS: 25.0000 mg | ORAL_TABLET | Freq: Every day | ORAL | 1 refills | Status: DC

## 2017-12-09 MED ORDER — LOSARTAN POTASSIUM 100 MG PO TABS: 100.0000 mg | ORAL_TABLET | Freq: Every day | ORAL | 1 refills | Status: DC

## 2017-12-09 NOTE — Telephone Encounter (Signed)
Ok to change to losartan 100mg  daily and hydrochlorothiazide 25mg  daily.  Please also notify patient (pharmacy should be, but I've found that's not always true).

## 2017-12-09 NOTE — Telephone Encounter (Signed)
Received a fax from [facility] [phone] stating that patient's Losartan/HCTZ 100/25mg  is on backorder, and they are requesting a change to 2 separate prescriptions instead of the combination tablet. Please Advise.

## 2017-12-09 NOTE — Telephone Encounter (Signed)
Patient notified and agreed. Patient wants #[phone]. Faxed to pharmacy.

## 2017-12-12 ENCOUNTER — Other Ambulatory Visit: Payer: Self-pay | Admitting: *Deleted

## 2017-12-12 MED ORDER — ONETOUCH DELICA LANCETS 33G MISC: 5 refills | Status: DC

## 2017-12-12 NOTE — Telephone Encounter (Signed)
[facility] 

## 2017-12-31 ENCOUNTER — Other Ambulatory Visit: Payer: Self-pay | Admitting: Internal Medicine

## 2017-12-31 DIAGNOSIS — G4709 Other insomnia: Secondary | ICD-10-CM

## 2018-01-03 ENCOUNTER — Other Ambulatory Visit: Payer: Self-pay | Admitting: *Deleted

## 2018-01-03 DIAGNOSIS — G4709 Other insomnia: Secondary | ICD-10-CM

## 2018-01-03 MED ORDER — TEMAZEPAM 15 MG PO CAPS: ORAL_CAPSULE | ORAL | 1 refills | Status: DC

## 2018-01-03 NOTE — Telephone Encounter (Signed)
Received fax from [facility] [phone] stating patient is requesting a 90 day supply of his Temazepam 15mg  instead of 30. Please Advise.   Rx pended and sent to Dr. Mariea Clonts for approval.

## 2018-01-03 NOTE — Telephone Encounter (Signed)
I only sent in 30 day for this controlled med, called in yesterday. Pt stated Dr. Nyoka Cowden would send in 75mth supply.

## 2018-01-13 ENCOUNTER — Other Ambulatory Visit: Payer: Self-pay | Admitting: Internal Medicine

## 2018-01-13 ENCOUNTER — Encounter: Payer: Self-pay | Admitting: Internal Medicine

## 2018-01-13 DIAGNOSIS — H5203 Hypermetropia, bilateral: Secondary | ICD-10-CM | POA: Diagnosis not present

## 2018-01-13 DIAGNOSIS — H2513 Age-related nuclear cataract, bilateral: Secondary | ICD-10-CM | POA: Diagnosis not present

## 2018-01-13 DIAGNOSIS — E119 Type 2 diabetes mellitus without complications: Secondary | ICD-10-CM | POA: Diagnosis not present

## 2018-01-13 LAB — HM DIABETES EYE EXAM

## 2018-01-19 DIAGNOSIS — G5131 Clonic hemifacial spasm, right: Secondary | ICD-10-CM | POA: Diagnosis not present

## 2018-01-19 DIAGNOSIS — G5133 Clonic hemifacial spasm, bilateral: Secondary | ICD-10-CM | POA: Diagnosis not present

## 2018-01-19 DIAGNOSIS — G245 Blepharospasm: Secondary | ICD-10-CM | POA: Diagnosis not present

## 2018-01-19 DIAGNOSIS — G244 Idiopathic orofacial dystonia: Secondary | ICD-10-CM | POA: Diagnosis not present

## 2018-01-19 DIAGNOSIS — Z0100 Encounter for examination of eyes and vision without abnormal findings: Secondary | ICD-10-CM | POA: Diagnosis not present

## 2018-01-19 DIAGNOSIS — G5132 Clonic hemifacial spasm, left: Secondary | ICD-10-CM | POA: Diagnosis not present

## 2018-01-26 ENCOUNTER — Ambulatory Visit (INDEPENDENT_AMBULATORY_CARE_PROVIDER_SITE_OTHER): Payer: Medicare Other

## 2018-01-26 DIAGNOSIS — Z23 Encounter for immunization: Secondary | ICD-10-CM

## 2018-02-02 ENCOUNTER — Other Ambulatory Visit: Payer: Self-pay | Admitting: Internal Medicine

## 2018-02-02 DIAGNOSIS — G4709 Other insomnia: Secondary | ICD-10-CM

## 2018-02-11 ENCOUNTER — Other Ambulatory Visit: Payer: Self-pay | Admitting: Internal Medicine

## 2018-02-11 DIAGNOSIS — G4709 Other insomnia: Secondary | ICD-10-CM

## 2018-02-15 ENCOUNTER — Other Ambulatory Visit: Payer: Self-pay | Admitting: Internal Medicine

## 2018-02-15 DIAGNOSIS — G4709 Other insomnia: Secondary | ICD-10-CM

## 2018-02-15 NOTE — Telephone Encounter (Signed)
rx called into pharmacy For the 2nd time because they deleted by mistake

## 2018-02-26 ENCOUNTER — Other Ambulatory Visit: Payer: Self-pay | Admitting: Internal Medicine

## 2018-04-24 ENCOUNTER — Other Ambulatory Visit: Payer: Medicare Other

## 2018-04-24 DIAGNOSIS — N183 Chronic kidney disease, stage 3 (moderate): Principal | ICD-10-CM

## 2018-04-24 DIAGNOSIS — I10 Essential (primary) hypertension: Secondary | ICD-10-CM

## 2018-04-24 DIAGNOSIS — E1122 Type 2 diabetes mellitus with diabetic chronic kidney disease: Secondary | ICD-10-CM

## 2018-04-25 LAB — CBC WITH DIFFERENTIAL/PLATELET
Absolute Monocytes: 616 cells/uL (ref 200–950)
Basophils Absolute: 18 cells/uL (ref 0–200)
Basophils Relative: 0.3 %
Eosinophils Absolute: 268 cells/uL (ref 15–500)
Eosinophils Relative: 4.4 %
HCT: 44.2 % (ref 38.5–50.0)
Hemoglobin: 15.1 g/dL (ref 13.2–17.1)
Lymphs Abs: 1543 cells/uL (ref 850–3900)
MCH: 30.6 pg (ref 27.0–33.0)
MCHC: 34.2 g/dL (ref 32.0–36.0)
MCV: 89.7 fL (ref 80.0–100.0)
MPV: 11 fL (ref 7.5–12.5)
Monocytes Relative: 10.1 %
Neutro Abs: 3654 cells/uL (ref 1500–7800)
Neutrophils Relative %: 59.9 %
Platelets: 188 10*3/uL (ref 140–400)
RBC: 4.93 10*6/uL (ref 4.20–5.80)
RDW: 12.8 % (ref 11.0–15.0)
Total Lymphocyte: 25.3 %
WBC: 6.1 10*3/uL (ref 3.8–10.8)

## 2018-04-25 LAB — BASIC METABOLIC PANEL
BUN/Creatinine Ratio: 20 (calc) (ref 6–22)
BUN: 26 mg/dL — ABNORMAL HIGH (ref 7–25)
CO2: 26 mmol/L (ref 20–32)
Calcium: 9.2 mg/dL (ref 8.6–10.3)
Chloride: 106 mmol/L (ref 98–110)
Creat: 1.28 mg/dL — ABNORMAL HIGH (ref 0.70–1.11)
Glucose, Bld: 84 mg/dL (ref 65–99)
Potassium: 4.2 mmol/L (ref 3.5–5.3)
Sodium: 141 mmol/L (ref 135–146)

## 2018-04-25 LAB — HEMOGLOBIN A1C
Hgb A1c MFr Bld: 6 % of total Hgb — ABNORMAL HIGH (ref <5.7)
Mean Plasma Glucose: 126 (calc)
eAG (mmol/L): 7 (calc)

## 2018-05-01 ENCOUNTER — Ambulatory Visit (INDEPENDENT_AMBULATORY_CARE_PROVIDER_SITE_OTHER): Payer: Medicare Other | Admitting: Nurse Practitioner

## 2018-05-01 ENCOUNTER — Telehealth: Payer: Self-pay | Admitting: *Deleted

## 2018-05-01 ENCOUNTER — Encounter: Payer: Self-pay | Admitting: Nurse Practitioner

## 2018-05-01 ENCOUNTER — Ambulatory Visit: Payer: Medicare Other | Admitting: Internal Medicine

## 2018-05-01 VITALS — BP 118/64 | HR 61 | Temp 97.5°F | Ht 65.0 in | Wt 161.8 lb

## 2018-05-01 DIAGNOSIS — E1122 Type 2 diabetes mellitus with diabetic chronic kidney disease: Secondary | ICD-10-CM | POA: Diagnosis not present

## 2018-05-01 DIAGNOSIS — G4709 Other insomnia: Secondary | ICD-10-CM | POA: Diagnosis not present

## 2018-05-01 DIAGNOSIS — N183 Chronic kidney disease, stage 3 unspecified: Secondary | ICD-10-CM

## 2018-05-01 DIAGNOSIS — E039 Hypothyroidism, unspecified: Secondary | ICD-10-CM

## 2018-05-01 DIAGNOSIS — I10 Essential (primary) hypertension: Secondary | ICD-10-CM

## 2018-05-01 DIAGNOSIS — F411 Generalized anxiety disorder: Secondary | ICD-10-CM

## 2018-05-01 DIAGNOSIS — G510 Bell's palsy: Secondary | ICD-10-CM

## 2018-05-01 DIAGNOSIS — E785 Hyperlipidemia, unspecified: Secondary | ICD-10-CM

## 2018-05-01 DIAGNOSIS — M1611 Unilateral primary osteoarthritis, right hip: Secondary | ICD-10-CM

## 2018-05-01 MED ORDER — ALPRAZOLAM 0.25 MG PO TABS: 0.2500 mg | ORAL_TABLET | Freq: Every day | ORAL | 0 refills | Status: DC | PRN

## 2018-05-01 NOTE — Telephone Encounter (Signed)
Thank you. It appears we have already sent him in an Rx for plain losartan. Will you have him make appt for ~ 2 weeks so we can follow up blood pressure in office she we are taking him off HCTZ

## 2018-05-01 NOTE — Progress Notes (Signed)
Careteam: Patient Care Team: Gayland Curry, DO as PCP - General (Geriatric Medicine) [name], MD as Consulting Physician (Ophthalmology)  Advanced Directive information Does Patient Have a Medical Advance Directive?: No, Would patient like information on creating a medical advance directive?: No - Patient declined  Allergies  Allergen Reactions  . Fioricet [Butalbital-Apap-Caffeine]     Unaware of this allergy per patient.   . Other Other (See Comments)    Profen II DM= urinary outlet obstruction Unaware of this allergy per patient.  [name] [Guaifenesin]     Unaware of this allergy per patient.    Chief Complaint  Patient presents with  . Medical Management of Chronic Issues    6 month follow up,discuss lab results      HPI: Patient is a 85 y.o. male seen in the office today for 6 month follow up.  Pt of Dr Mariea Clonts.   DM- eats liberally (likes donuts and white bread), A1c 6, taking amaryl 1 mg daily. No episodes of hypoglycemia.   Hx of bells palsy-getting injections from the oculofacial surgeon but feels like they are making it worse. Unable to open mouth at times. Can not sing anymore.   htn- taking HCTZ and cozaar. However reports he is not taking HCTZ. States he has never taken 2 pills for blood pressure.   Hyperlipidemia- taking zocor 20 mg daily   hypothyroid taking synthroid 50 mcg TSH 3.07 in July 2019  Insomnia- using temazepam which he is currently dependant on for sleep.   Anxiety- alprazolam using 1/2 to none daily as needed   Review of Systems:  Review of Systems  Constitutional: Negative for chills, fever and malaise/fatigue.  HENT: Negative for congestion and hearing loss.   Eyes: Negative for blurred vision.  Respiratory: Negative for shortness of breath.   Cardiovascular: Negative for chest pain, palpitations and leg swelling.  Gastrointestinal: Positive for constipation (controlled on miralax). Negative for abdominal pain, blood  in stool, diarrhea, heartburn and melena.  Genitourinary: Negative for dysuria.  Musculoskeletal: Negative for falls and joint pain.  Neurological: Negative for dizziness, loss of consciousness and headaches.  Endo/Heme/Allergies: Does not bruise/bleed easily.  Psychiatric/Behavioral: Negative for memory loss. The patient is nervous/anxious (occasionally) and has insomnia.        Sleeps but only with benzo that he's taken many years after lots of foreign travel    Past Medical History:  Diagnosis Date  . Bladder neck obstruction 03/28/2008  . Contact with or exposure to tuberculosis 03/15/1992  . Elevated prostate specific antigen (PSA) 03/15/1998  . Gastric ulcer, unspecified as acute or chronic, without mention of hemorrhage, perforation, or obstruction 03/15/1993  . Hypertension 07/08/1998  . Hypertrophy of prostate without urinary obstruction and other lower urinary tract symptoms (LUTS) 03/15/1989  . 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 Inguinal hernia without mention of obstruction or gangrene, unilateral or unspecified, (not specified as recurrent) 11/20/2008  . Insomnia, unspecified 11/01/2003  . Kidney calculi 08/20/2009  . Other and unspecified hyperlipidemia 12/23/2004  . Other malaise and fatigue 12/23/2004  . Pain in joint, pelvic region and thigh 12/17/2009  . Pain in joint, shoulder region 11/01/2003  . Peptic ulcer, unspecified site, unspecified as acute or chronic, without mention of hemorrhage, perforation, or obstruction 06/25/2003  . Personal history of malaria 03/16/1939  . Rosacea 08/20/2009  . Type II or unspecified type diabetes mellitus without mention of complication, not stated as uncontrolled 07/15/2006  . Unspecified constipation 08/05/2010  . Unspecified  hypothyroidism 12/17/2009   Past Surgical History:  Procedure Laterality Date  . APPENDECTOMY  1956  . HEMORROIDECTOMY  06/2002   Dr. Lennie Hummer  . HERNIA REPAIR Bilateral 11/2008   inguinal  .  PILONIDAL CYST EXCISION  1963  . PROSTATE SURGERY    . TOTAL HIP ARTHROPLASTY Right 07/10/2009   Dr. Telford Nab   Social History:   reports that he quit smoking about 39 years ago. He has never used smokeless tobacco. He reports current alcohol use. He reports that he does not use drugs.  Family History  Problem Relation Age of Onset  . Heart disease Father     Medications: Patient's Medications  New Prescriptions   No medications on file  Previous Medications   ALPRAZOLAM (XANAX) 0.25 MG TABLET    TAKE 1 TABLET BY MOUTH UP TO THREE TIMES DAILY   ASPIRIN 81 MG TABLET    Take 81 mg by mouth daily.   CHOLECALCIFEROL (VITAMIN D PO)    Take 1,000 Units by mouth daily.    GLIMEPIRIDE (AMARYL) 1 MG TABLET    TAKE 1 TABLET BY MOUTH EVERY DAY   GLUCOSE BLOOD (ONE TOUCH ULTRA TEST) TEST STRIP    USE TO CHECK BLOOD SUGAR ONCE DAILY AND AS DIRECTED   HYDROCHLOROTHIAZIDE (HYDRODIURIL) 25 MG TABLET    Take 1 tablet (25 mg total) by mouth daily.   LEVOTHYROXINE (SYNTHROID, LEVOTHROID) 50 MCG TABLET    TAKE 1 TABLET BY MOUTH EVERY DAY FOR THYROID   LOSARTAN (COZAAR) 100 MG TABLET    Take 1 tablet (100 mg total) by mouth daily.   NITROGLYCERIN (NITROSTAT) 0.4 MG SL TABLET    Place 1 tablet (0.4 mg total) under the tongue every 5 (five) minutes as needed for chest pain.   ONETOUCH DELICA LANCETS 16W MISC    Check blood sugar once daily as directed E11.22   SIMVASTATIN (ZOCOR) 20 MG TABLET    TAKE 1 TABLET BY MOUTH EVERY NIGHT AT BEDTIME   TEMAZEPAM (RESTORIL) 15 MG CAPSULE    Take one capsule by mouth once daily at bedtime as needed for rest   TRIAMCINOLONE CREAM (KENALOG) 0.1 %    APPLY EXTERNALLY TO RASH TWICE DAILY AS NEEDED  Modified Medications   No medications on file  Discontinued Medications   No medications on file     Physical Exam:  Vitals:   05/01/18 1058  BP: 118/64  Pulse: 61  Temp: (!) 97.5 F (36.4 C)  TempSrc: Oral  SpO2: 97%  Weight: 161 lb 12.8 oz (73.4 kg)  Height: 5'  5" (1.651 m)   Body mass index is 26.92 kg/m.  Physical Exam Constitutional:      General: He is not in acute distress.    Appearance: He is well-developed.  HENT:     Head: Normocephalic and atraumatic.     Right Ear: External ear normal.     Left Ear: External ear normal.     Nose: Nose normal.  Eyes:     Conjunctiva/sclera: Conjunctivae normal.     Pupils: Pupils are equal, round, and reactive to light.  Neck:     Musculoskeletal: Neck supple.     Thyroid: No thyromegaly.     Vascular: No JVD.     Trachea: No tracheal deviation.  Cardiovascular:     Rate and Rhythm: Regular rhythm.     Heart sounds: Normal heart sounds. No murmur. No friction rub. No gallop.   Pulmonary:  Effort: No respiratory distress.     Breath sounds: Normal breath sounds. No wheezing or rales.  Chest:     Chest wall: No tenderness.  Abdominal:     General: Bowel sounds are normal. There is no distension.     Palpations: There is no mass.     Tenderness: There is no abdominal tenderness.  Musculoskeletal: Normal range of motion.     Comments: Tenderness right hip.  Lymphadenopathy:     Cervical: No cervical adenopathy.  Skin:    Coloration: Skin is not pale.     Findings: No erythema or rash.  Neurological:     Mental Status: He is alert and oriented to person, place, and time.     Cranial Nerves: No cranial nerve deficit.     Coordination: Coordination normal.     Comments: Severe left facial drooping   Psychiatric:        Behavior: Behavior normal.        Thought Content: Thought content normal.        Judgment: Judgment normal.     Labs reviewed: Basic Metabolic Panel: Recent Labs    09/19/17 0840 04/24/18 0802  NA 140 141  K 4.3 4.2  CL 106 106  CO2 25 26  GLUCOSE 88 84  BUN 26* 26*  CREATININE 1.18* 1.28*  CALCIUM 9.3 9.2  TSH 3.07  --    Liver Function Tests: Recent Labs    09/19/17 0840  AST 15  ALT 12  BILITOT 0.7  PROT 6.7   No results for input(s):  LIPASE, AMYLASE in the last 8760 hours. No results for input(s): AMMONIA in the last 8760 hours. CBC: Recent Labs    09/19/17 0840 04/24/18 0802  WBC 6.6 6.1  NEUTROABS 3,808 3,654  HGB 14.8 15.1  HCT 43.7 44.2  MCV 89.4 89.7  PLT 165 188   Lipid Panel: Recent Labs    09/19/17 0840  CHOL 147  HDL 51  LDLCALC 80  TRIG 77  CHOLHDL 2.9   TSH: Recent Labs    09/19/17 0840  TSH 3.07   A1C: Lab Results  Component Value Date   HGBA1C 6.0 (H) 04/24/2018     Assessment/Plan 1. Type 2 diabetes mellitus with stage 3 chronic kidney disease, without long-term current use of insulin (HCC) Controlled on glimepiride. Encouraged dietary compliance, routine foot care/monitoring and to keep up with diabetic eye exams through ophthalmology. No hypoglycemic episodes.   2. Hypothyroidism, unspecified type TSH has been stable, continues on synthroid 50 mcg. Follow up TSH with next labs.   3. Essential hypertension Controlled, to stop HCTZ due to worsen renal function. Will follow up in 2 weeks on blood pressure.   4. Primary osteoarthritis of right hip To avoid NSAIDS. To use tylenol 500 mg 1-2 tablets every 8 hours as needed pain.   5. Other insomnia Controlled on temazepam   6. Hyperlipidemia, unspecified hyperlipidemia type -continues on zocor, will follow up lipids prior to next visit.   7. Bell's palsy -ongoing, getting injections but feels like this may be making symptoms worse.   8. Stage 3 chronic kidney disease ([place]) -states he is not taking HCTZ but will call and confirm.  -Encourage proper hydration and to avoid NSAIDS (Aleve, Advil, Motrin, Ibuprofen)   9. Anxiety state Controlled, occasionally will use alprazolam  - ALPRAZolam (XANAX) 0.25 MG tablet; Take 1 tablet (0.25 mg total) by mouth daily as needed for anxiety.  Dispense: 30 tablet; Refill: 0  Next appt: 6 months with lab work prior ( 2 weeks for bp check) Janett Billow K. [place], [facility] [phone]

## 2018-05-01 NOTE — Telephone Encounter (Signed)
Patient notified and appointment scheduled for 05/15/18

## 2018-05-01 NOTE — Telephone Encounter (Signed)
Patient called and stated that you wanted him to call about HCTZ.  Stated that his Losartan he was taking was the combination pill with HCTZ.  Stated that the pharmacy is going to start giving him the "plain" Losartan.  He stated he was just calling to let you know.

## 2018-05-01 NOTE — Patient Instructions (Addendum)
Encourage proper hydration and to avoid NSAIDS (Aleve, Advil, Motrin, Ibuprofen)  To use tylenol 500 mg by mouth 2 tablets every 8 hours as needed for pain Goal is eight -8 oz of water daily Increase water intake.  Follow up in 4 months with lab work before visit   If you are taking the HCTZ (Hydrochlorothiazide) let us know

## 2018-05-15 ENCOUNTER — Ambulatory Visit: Payer: Self-pay | Admitting: Nurse Practitioner

## 2018-06-05 ENCOUNTER — Encounter: Payer: Self-pay | Admitting: Nurse Practitioner

## 2018-06-27 ENCOUNTER — Other Ambulatory Visit: Payer: Self-pay | Admitting: Internal Medicine

## 2018-06-27 DIAGNOSIS — G4709 Other insomnia: Secondary | ICD-10-CM

## 2018-07-02 ENCOUNTER — Other Ambulatory Visit: Payer: Self-pay | Admitting: Internal Medicine

## 2018-07-09 ENCOUNTER — Encounter (HOSPITAL_COMMUNITY): Payer: Self-pay | Admitting: Emergency Medicine

## 2018-07-09 ENCOUNTER — Observation Stay (HOSPITAL_COMMUNITY)
Admission: EM | Admit: 2018-07-09 | Discharge: 2018-07-10 | Disposition: A | Discharge status: Home / Self Care | Payer: Medicare Other | Attending: Internal Medicine | Admitting: Internal Medicine

## 2018-07-09 ENCOUNTER — Encounter: Payer: Self-pay | Admitting: Internal Medicine

## 2018-07-09 ENCOUNTER — Emergency Department (HOSPITAL_COMMUNITY): Payer: Medicare Other

## 2018-07-09 DIAGNOSIS — R0789 Other chest pain: Secondary | ICD-10-CM | POA: Diagnosis not present

## 2018-07-09 DIAGNOSIS — E1122 Type 2 diabetes mellitus with diabetic chronic kidney disease: Secondary | ICD-10-CM | POA: Diagnosis not present

## 2018-07-09 DIAGNOSIS — N183 Chronic kidney disease, stage 3 (moderate): Secondary | ICD-10-CM | POA: Insufficient documentation

## 2018-07-09 DIAGNOSIS — E039 Hypothyroidism, unspecified: Secondary | ICD-10-CM | POA: Diagnosis present

## 2018-07-09 DIAGNOSIS — Z79899 Other long term (current) drug therapy: Secondary | ICD-10-CM | POA: Insufficient documentation

## 2018-07-09 DIAGNOSIS — M75101 Unspecified rotator cuff tear or rupture of right shoulder, not specified as traumatic: Secondary | ICD-10-CM | POA: Diagnosis present

## 2018-07-09 DIAGNOSIS — I10 Essential (primary) hypertension: Secondary | ICD-10-CM | POA: Diagnosis not present

## 2018-07-09 DIAGNOSIS — W0110XA Fall on same level from slipping, tripping and stumbling with subsequent striking against unspecified object, initial encounter: Secondary | ICD-10-CM | POA: Insufficient documentation

## 2018-07-09 DIAGNOSIS — W19XXXA Unspecified fall, initial encounter: Secondary | ICD-10-CM | POA: Insufficient documentation

## 2018-07-09 DIAGNOSIS — Z87891 Personal history of nicotine dependence: Secondary | ICD-10-CM | POA: Diagnosis not present

## 2018-07-09 DIAGNOSIS — S065X9A Traumatic subdural hemorrhage with loss of consciousness of unspecified duration, initial encounter: Secondary | ICD-10-CM | POA: Diagnosis present

## 2018-07-09 DIAGNOSIS — E038 Other specified hypothyroidism: Secondary | ICD-10-CM | POA: Diagnosis not present

## 2018-07-09 DIAGNOSIS — Y929 Unspecified place or not applicable: Secondary | ICD-10-CM | POA: Diagnosis not present

## 2018-07-09 DIAGNOSIS — Z7982 Long term (current) use of aspirin: Secondary | ICD-10-CM | POA: Diagnosis not present

## 2018-07-09 DIAGNOSIS — S065X0A Traumatic subdural hemorrhage without loss of consciousness, initial encounter: Principal | ICD-10-CM | POA: Insufficient documentation

## 2018-07-09 DIAGNOSIS — Y998 Other external cause status: Secondary | ICD-10-CM | POA: Insufficient documentation

## 2018-07-09 DIAGNOSIS — S0511XA Contusion of eyeball and orbital tissues, right eye, initial encounter: Secondary | ICD-10-CM | POA: Diagnosis not present

## 2018-07-09 DIAGNOSIS — Z96641 Presence of right artificial hip joint: Secondary | ICD-10-CM | POA: Insufficient documentation

## 2018-07-09 DIAGNOSIS — M79621 Pain in right upper arm: Secondary | ICD-10-CM | POA: Diagnosis not present

## 2018-07-09 DIAGNOSIS — Y9389 Activity, other specified: Secondary | ICD-10-CM | POA: Insufficient documentation

## 2018-07-09 DIAGNOSIS — I129 Hypertensive chronic kidney disease with stage 1 through stage 4 chronic kidney disease, or unspecified chronic kidney disease: Secondary | ICD-10-CM | POA: Diagnosis not present

## 2018-07-09 DIAGNOSIS — S199XXA Unspecified injury of neck, initial encounter: Secondary | ICD-10-CM | POA: Diagnosis not present

## 2018-07-09 DIAGNOSIS — Z7984 Long term (current) use of oral hypoglycemic drugs: Secondary | ICD-10-CM | POA: Insufficient documentation

## 2018-07-09 DIAGNOSIS — S46011A Strain of muscle(s) and tendon(s) of the rotator cuff of right shoulder, initial encounter: Secondary | ICD-10-CM | POA: Diagnosis not present

## 2018-07-09 DIAGNOSIS — M25511 Pain in right shoulder: Secondary | ICD-10-CM | POA: Diagnosis not present

## 2018-07-09 DIAGNOSIS — S299XXA Unspecified injury of thorax, initial encounter: Secondary | ICD-10-CM | POA: Diagnosis present

## 2018-07-09 DIAGNOSIS — S065XAA Traumatic subdural hemorrhage with loss of consciousness status unknown, initial encounter: Secondary | ICD-10-CM | POA: Diagnosis present

## 2018-07-09 DIAGNOSIS — S4991XA Unspecified injury of right shoulder and upper arm, initial encounter: Secondary | ICD-10-CM | POA: Diagnosis present

## 2018-07-09 DIAGNOSIS — S2231XA Fracture of one rib, right side, initial encounter for closed fracture: Secondary | ICD-10-CM | POA: Diagnosis not present

## 2018-07-09 DIAGNOSIS — R51 Headache: Secondary | ICD-10-CM | POA: Diagnosis not present

## 2018-07-09 HISTORY — DX: Unspecified injury of right shoulder and upper arm, initial encounter: S49.91XA

## 2018-07-09 HISTORY — DX: Traumatic subdural hemorrhage with loss of consciousness status unknown, initial encounter: S06.5XAA

## 2018-07-09 HISTORY — DX: Traumatic subdural hemorrhage with loss of consciousness of unspecified duration, initial encounter: S06.5X9A

## 2018-07-09 HISTORY — DX: Fracture of one rib, unspecified side, initial encounter for closed fracture: S22.39XA

## 2018-07-09 HISTORY — DX: Unspecified rotator cuff tear or rupture of right shoulder, not specified as traumatic: M75.101

## 2018-07-09 LAB — CBC WITH DIFFERENTIAL/PLATELET
Abs Immature Granulocytes: 0.03 10*3/uL (ref 0.00–0.07)
Basophils Absolute: 0 10*3/uL (ref 0.0–0.1)
Basophils Relative: 1 %
Eosinophils Absolute: 0.3 10*3/uL (ref 0.0–0.5)
Eosinophils Relative: 4 %
HCT: 42.2 % (ref 39.0–52.0)
Hemoglobin: 14.4 g/dL (ref 13.0–17.0)
Immature Granulocytes: 0 %
Lymphocytes Relative: 24 %
Lymphs Abs: 1.8 10*3/uL (ref 0.7–4.0)
MCH: 31 pg (ref 26.0–34.0)
MCHC: 34.1 g/dL (ref 30.0–36.0)
MCV: 90.8 fL (ref 80.0–100.0)
Monocytes Absolute: 0.6 10*3/uL (ref 0.1–1.0)
Monocytes Relative: 8 %
Neutro Abs: 4.5 10*3/uL (ref 1.7–7.7)
Neutrophils Relative %: 63 %
Platelets: 143 10*3/uL — ABNORMAL LOW (ref 150–400)
RBC: 4.65 MIL/uL (ref 4.22–5.81)
RDW: 13.6 % (ref 11.5–15.5)
WBC: 7.3 10*3/uL (ref 4.0–10.5)
nRBC: 0 % (ref 0.0–0.2)

## 2018-07-09 LAB — BASIC METABOLIC PANEL
Anion gap: 9 (ref 5–15)
BUN: 22 mg/dL (ref 8–23)
CO2: 21 mmol/L — ABNORMAL LOW (ref 22–32)
Calcium: 9 mg/dL (ref 8.9–10.3)
Chloride: 108 mmol/L (ref 98–111)
Creatinine, Ser: 1.08 mg/dL (ref 0.61–1.24)
GFR calc Af Amer: >60 mL/min (ref >60)
GFR calc non Af Amer: >60 mL/min (ref >60)
Glucose, Bld: 90 mg/dL (ref 70–99)
Potassium: 4 mmol/L (ref 3.5–5.1)
Sodium: 138 mmol/L (ref 135–145)

## 2018-07-09 MED ORDER — LOSARTAN POTASSIUM 50 MG PO TABS
100.0000 mg | ORAL_TABLET | Freq: Every day | ORAL | Status: DC
  Administered 2018-07-10: 100 mg via ORAL
  Filled 2018-07-09: qty 2

## 2018-07-09 MED ORDER — SODIUM CHLORIDE 0.9% FLUSH: 3.0000 mL | INTRAVENOUS | Status: DC | PRN

## 2018-07-09 MED ORDER — SODIUM CHLORIDE 0.9 % IV SOLN: 250.0000 mL | INTRAVENOUS | Status: DC | PRN

## 2018-07-09 MED ORDER — LOSARTAN POTASSIUM-HCTZ 100-25 MG PO TABS: 0.5000 | ORAL_TABLET | Freq: Every day | ORAL | Status: DC

## 2018-07-09 MED ORDER — HYDROCHLOROTHIAZIDE 25 MG PO TABS
25.0000 mg | ORAL_TABLET | Freq: Every day | ORAL | Status: DC
  Administered 2018-07-10: 25 mg via ORAL
  Filled 2018-07-09: qty 1

## 2018-07-09 MED ORDER — SIMVASTATIN 20 MG PO TABS
20.0000 mg | ORAL_TABLET | Freq: Every day | ORAL | Status: DC
  Administered 2018-07-09: 22:00:00 20 mg via ORAL
  Filled 2018-07-09: qty 1

## 2018-07-09 MED ORDER — NITROGLYCERIN 0.4 MG SL SUBL: 0.4000 mg | SUBLINGUAL_TABLET | SUBLINGUAL | Status: DC | PRN

## 2018-07-09 MED ORDER — GLIMEPIRIDE 1 MG PO TABS
1.0000 mg | ORAL_TABLET | Freq: Every day | ORAL | Status: DC
  Administered 2018-07-10: 1 mg via ORAL
  Filled 2018-07-09 (×2): qty 1

## 2018-07-09 MED ORDER — ACETAMINOPHEN 650 MG RE SUPP: 650.0000 mg | Freq: Four times a day (QID) | RECTAL | Status: DC | PRN

## 2018-07-09 MED ORDER — ALPRAZOLAM 0.25 MG PO TABS
0.2500 mg | ORAL_TABLET | Freq: Three times a day (TID) | ORAL | Status: DC | PRN
  Administered 2018-07-09: 0.25 mg via ORAL
  Filled 2018-07-09: qty 1

## 2018-07-09 MED ORDER — SODIUM CHLORIDE 0.9% FLUSH
3.0000 mL | Freq: Two times a day (BID) | INTRAVENOUS | Status: DC
  Administered 2018-07-09 – 2018-07-10 (×2): 3 mL via INTRAVENOUS

## 2018-07-09 MED ORDER — ACETAMINOPHEN 325 MG PO TABS
650.0000 mg | ORAL_TABLET | Freq: Four times a day (QID) | ORAL | Status: DC | PRN
  Administered 2018-07-09 – 2018-07-10 (×3): 650 mg via ORAL
  Filled 2018-07-09 (×3): qty 2

## 2018-07-09 NOTE — ED Provider Notes (Addendum)
[facility] EMERGENCY DEPARTMENT Provider Note   CSN: [id number] Arrival date & time: 07/09/18  1402    History   Chief Complaint Chief Complaint  Patient presents with  . Fall    HPI Bobby Jensen is a 85 y.o. male with history of diabetes on oral agents, history of Bell's palsy, hypertension, hyperlipidemia, hypothyroid, brought to the ED by EMS for evaluation of injury sustained after a fall.  Per EMS, unclear at the breast LOC but per family report it was a mechanical/trip and fall.  Patient reports pain to his right shoulder otherwise denies any other symptoms.  He has bruising and swelling to his right cheekbone neurology team but has no vision changes, double vision.  No associated headache, neck pain, chest pain, shortness of breath, abdominal pain, low back pain or any other injuries to his other extremities.  On a daily aspirin.  States over the last week he has been feeling fine without recent fever, cough, dysuria, vomiting or diarrhea.   Obtained more information from wife who witnessed the fall.  States that they were sitting outside and patient stood up from a chair and walked tripping over a nearby cooler, he fell mostly on his right side.  He did not lose consciousness but seemed a little confused after a few seconds he was able to converse state at his baseline.  She confirms patient has been at his baseline for his last week otherwise.     HPI  Past Medical History:  Diagnosis Date  . Bladder neck obstruction 03/28/2008  . Contact with or exposure to tuberculosis 03/15/1992  . Elevated prostate specific antigen (PSA) 03/15/1998  . Gastric ulcer, unspecified as acute or chronic, without mention of hemorrhage, perforation, or obstruction 03/15/1993  . Hypertension 07/08/1998  . Hypertrophy of prostate without urinary obstruction and other lower urinary tract symptoms (LUTS) 03/15/1989  . 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 Inguinal hernia  without mention of obstruction or gangrene, unilateral or unspecified, (not specified as recurrent) 11/20/2008  . Insomnia, unspecified 11/01/2003  . Kidney calculi 08/20/2009  . Other and unspecified hyperlipidemia 12/23/2004  . Other malaise and fatigue 12/23/2004  . Pain in joint, pelvic region and thigh 12/17/2009  . Pain in joint, shoulder region 11/01/2003  . Peptic ulcer, unspecified site, unspecified as acute or chronic, without mention of hemorrhage, perforation, or obstruction 06/25/2003  . Personal history of malaria 03/16/1939  . Rosacea 08/20/2009  . Type II or unspecified type diabetes mellitus without mention of complication, not stated as uncontrolled 07/15/2006  . Unspecified constipation 08/05/2010  . Unspecified hypothyroidism 12/17/2009    Patient Active Problem List   Diagnosis Date Noted  . Primary osteoarthritis of right hip 03/17/2017  . Bell's palsy 05/06/2015  . Dizziness 11/01/2014  . Bursitis of right hip 05/23/2014  . Abnormal x-ray 05/23/2014  . Bradycardia 07/11/2013  . Anxiety state 07/11/2013  . Hypothyroidism 12/17/2009  . 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 Hyperlipidemia 12/23/2004  . Insomnia 11/01/2003  . Hypertension 07/08/1998  . 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 Personal history of malaria 03/16/1939    Past Surgical History:  Procedure Laterality Date  . APPENDECTOMY  1956  . HEMORROIDECTOMY  06/2002   Dr. Lennie Hummer  . HERNIA REPAIR Bilateral 11/2008   inguinal  . PILONIDAL CYST EXCISION  1963  . PROSTATE SURGERY    . TOTAL HIP ARTHROPLASTY Right 07/10/2009   Dr.  Rendall        Home Medications    Prior to Admission medications   Medication Sig Start Date End Date Taking? Authorizing Provider  ALPRAZolam (XANAX) 0.25 MG tablet TAKE 1 TABLET BY MOUTH THREE TIMES DAILY Patient taking differently: Take 0.25 mg by mouth 3 (three) times daily as needed for anxiety.  06/27/18   Yes Reed, Tiffany L, DO  aspirin 81 MG tablet Take 81 mg by mouth daily.   Yes [provider]  Cholecalciferol (VITAMIN D PO) Take 1,000 Units by mouth daily.    Yes [provider]  glimepiride (AMARYL) 1 MG tablet TAKE 1 TABLET BY MOUTH EVERY DAY Patient taking differently: Take 1 mg by mouth daily.  12/07/17  Yes Reed, Tiffany L, DO  levothyroxine (SYNTHROID) 50 MCG tablet TAKE 1 TABLET BY MOUTH EVERY DAY FOR THYROID Patient taking differently: Take 50 mcg by mouth daily before breakfast.  07/03/18  Yes Reed, Tiffany L, DO  losartan-hydrochlorothiazide (HYZAAR) 100-25 MG tablet Take 0.5 tablets by mouth daily. 06/23/18  Yes [provider]  nitroGLYCERIN (NITROSTAT) 0.4 MG SL tablet Place 1 tablet (0.4 mg total) under the tongue every 5 (five) minutes as needed for chest pain. 08/27/16  Yes Dorothy Spark, MD  simvastatin (ZOCOR) 20 MG tablet TAKE 1 TABLET BY MOUTH EVERY NIGHT AT BEDTIME Patient taking differently: Take 20 mg by mouth daily at 6 PM.  01/13/18  Yes Reed, Tiffany L, DO  temazepam (RESTORIL) 15 MG capsule TAKE ONE CAPSULE BY MOUTH ONCE DAILY AT BEDTIME AS NEEDED FOR REST Patient taking differently: Take 15 mg by mouth at bedtime as needed for sleep.  06/27/18  Yes Reed, Tiffany L, DO  triamcinolone cream (KENALOG) 0.1 % APPLY EXTERNALLY TO RASH TWICE DAILY AS NEEDED Patient taking differently: Apply 1 application topically 2 (two) times daily as needed (for rash).  02/27/18  Yes Reed, Tiffany L, DO  glucose blood (ONE TOUCH ULTRA TEST) test strip USE TO CHECK BLOOD SUGAR ONCE DAILY AND AS DIRECTED 12/07/17   Reed, Tiffany L, DO  losartan (COZAAR) 100 MG tablet Take 1 tablet (100 mg total) by mouth daily. Patient not taking: Reported on 07/09/2018 12/09/17   Gayland Curry, DO  [facility] DELICA LANCETS 10X MISC Check blood sugar once daily as directed E11.22 12/12/17   Gayland Curry, DO    Family History Family History  Problem Relation Age of Onset  .  Heart disease Father     Social History Social History   Tobacco Use  . Smoking status: Former Smoker    Last attempt to quit: 07/31/1978    Years since quitting: 39.9  . Smokeless tobacco: Never Used  Substance Use Topics  . Alcohol use: Yes    Comment: Occ. glass of wine  . Drug use: No     Allergies   Fioricet [butalbital-apap-caffeine]; Other; and Tussin [guaifenesin]   Review of Systems Review of Systems  Skin: Positive for color change.  Neurological:       Amnesia   Psychiatric/Behavioral: Positive for confusion.  All other systems reviewed and are negative.    Physical Exam Updated Vital Signs BP (!) 174/84   Pulse 69   Temp 97.6 F (36.4 C) (Oral)   Resp (!) 23   SpO2 100%   Physical Exam Constitutional:      General: He is not in acute distress.    Appearance: He is well-developed.  HENT:     Head: Contusion present.  Comments: Abrasion to right parietal scalp mildly tender. No nasal or facial bone tenderness.     Ears:     Comments: No hemotympanum. No Battle's sign.    Nose:     Comments: No intranasal bleeding or rhinorrhea. Septum midline    Mouth/Throat:     Comments: No intraoral bleeding or injury. No malocclusion. MMM. Dentition appears stable.  Eyes:     Conjunctiva/sclera: Conjunctivae normal.     Right eye: Chemosis present.     Comments: Moderate ecchymosis and edema to right inferior orbital bone/zygomatic arch but no tenderness around this area.  Traumatic chemosis but full painless EOMs and PERRL intact.   Neck:     Musculoskeletal: Spinous process tenderness present.     Comments: C-spine: mild low midline tenderness. No muscular tenderness. In cervical collar.  Cardiovascular:     Rate and Rhythm: Normal rate and regular rhythm.     Pulses:          Radial pulses are 1+ on the right side and 1+ on the left side.       Dorsalis pedis pulses are 1+ on the right side and 1+ on the left side.     Heart sounds: Normal heart  sounds, S1 normal and S2 normal.  Pulmonary:     Effort: Pulmonary effort is normal.     Breath sounds: Normal breath sounds. No decreased breath sounds.  Chest:     Chest wall: Tenderness present.     Comments: Mild right mid/posterior rib cage tenderness but no overlaying ecchymosis, abrasions. Breathing even and unlabored. Lungs clear.  Abdominal:     Palpations: Abdomen is soft.     Tenderness: There is no abdominal tenderness.  Musculoskeletal: Normal range of motion.        General: No deformity.     Right shoulder: He exhibits tenderness.     Comments:  Right shoulder: Proximal anterior humeral tenderness with overlaying abrasion. Mild TTP to [facility] joint and lateral deltoid. No focal tenderness or obvious deformity to clavicle, scapula, sternum  T-spine: no paraspinal muscular tenderness or midline tenderness.   L-spine: no paraspinal muscular or midline tenderness.  Pelvis: no instability with AP/L compression, leg shortening or rotation. Full PROM of hips bilaterally without pain.   Skin:    General: Skin is warm and dry.     Capillary Refill: Capillary refill takes less than 2 seconds.     Findings: Bruising present.  Neurological:     Mental Status: He is alert, oriented to person, place, and time and easily aroused.     Comments:  Subtle left sided facial droop (h/o Bells Palsy) Speech is fluent without obvious dysarthria or dysphasia. Strength 5/5 with hand grip and ankle F/E.   Sensation to light touch intact in hands and feet.  CN II-XII grossly intact bilaterally.   Psychiatric:        Behavior: Behavior normal. Behavior is cooperative.        Thought Content: Thought content normal.      ED Treatments / Results  Labs (all labs ordered are listed, but only abnormal results are displayed) Labs Reviewed  CBC WITH DIFFERENTIAL/PLATELET - Abnormal; Notable for the following components:      Result Value   Platelets 143 (*)    All other components within normal  limits  BASIC METABOLIC PANEL - Abnormal; Notable for the following components:   CO2 21 (*)    All other components within normal limits  EKG None  Radiology Dg Ribs Unilateral W/chest Right  Result Date: 07/09/2018 CLINICAL DATA:  Right shoulder and right anterior chest pain. Unsure if fell. EXAM: RIGHT RIBS AND CHEST - 3+ VIEW COMPARISON:  Chest radiographs 10/30/2008. FINDINGS: There are lower lung volumes on the frontal chest examination with resulting mild bibasilar atelectasis. Stable cardiomegaly and aortic atherosclerosis. No pleural effusion or pneumothorax. Possible acute fracture of the right 8th rib anterolaterally. No displaced rib fractures are identified. There are mild degenerative changes throughout the spine. The subacromial space of both shoulders is obliterated, consistent with chronic rotator cuff tears. IMPRESSION: Possible nondisplaced fracture of the right 8th rib anterolaterally. No pneumothorax or pleural effusion. Electronically Signed   By: Richardean Sale M.D.   On: 07/09/2018 15:26   Dg Shoulder Right  Result Date: 07/09/2018 CLINICAL DATA:  Right shoulder pain.  Unsure of recent fall. EXAM: RIGHT SHOULDER - 2+ VIEW COMPARISON:  None. FINDINGS: The bones appear adequately mineralized. There is no evidence of acute fracture or dislocation. There are glenohumeral degenerative changes with narrowing of the subacromial space of the right shoulder, suggesting a chronic rotator cuff tear. Mild acromioclavicular degenerative changes are present as well. IMPRESSION: No acute osseous findings. Degenerative changes and secondary findings of chronic rotator cuff tear. Electronically Signed   By: Richardean Sale M.D.   On: 07/09/2018 15:23   Ct Head Wo Contrast  Result Date: 07/09/2018 CLINICAL DATA:  85 year old who fell earlier today. Patient essentially amnestic to the event and is unsure if he lost consciousness. RIGHT LOWER orbital ecchymosis. Initial encounter. EXAM: CT  HEAD WITHOUT CONTRAST CT MAXILLOFACIAL WITHOUT CONTRAST CT CERVICAL SPINE WITHOUT CONTRAST TECHNIQUE: Multidetector CT imaging of the head, cervical spine, and maxillofacial structures were performed using the standard protocol without intravenous contrast. Multiplanar CT image reconstructions of the cervical spine and maxillofacial structures were also generated. COMPARISON:  MRI brain 06/21/2016. No prior cervical spine or maxillofacial imaging. FINDINGS: CT HEAD FINDINGS Brain: Acute LEFT frontal convexity subdural hematoma with maximum thickness of approximately 7 mm. Mild mass effect with effacement of the cortical sulci, but no evidence of midline shift. No evidence of parenchymal contusion/hemorrhage. No evidence of intraventricular or subarachnoid hemorrhage. Moderate age related cortical and deep atrophy. Moderate changes of small vessel disease of the white matter diffusely. No evidence of acute stroke. Vascular: Mild BILATERAL carotid siphon and vertebral artery atherosclerosis. No hyperdense vessel. Skull: No skull fracture or other focal osseous abnormality involving the skull. Other: None. CT MAXILLOFACIAL FINDINGS Osseous: No fractures identified involving the facial bones. Temporomandibular joints anatomically aligned with only mild degenerative changes. Orbits: No orbital fractures. No evidence of orbital hemorrhage on the RIGHT where there is preseptal soft tissue swelling/ecchymosis. Benign senile calcifications noted in both globes. No evidence of hemorrhage involving the RIGHT globe. Sinuses: Near complete opacification of the LEFT maxillary sinus as noted on the prior MRI. Opacifications of multiple BILATERAL ethmoid air cells. Remaining paranasal sinuses well aerated, with only minimal, insignificant mucosal thickening involving the RIGHT maxillary sinus. Slight bony nasal septal deviation to the LEFT. BILATERAL mastoid air cells and BILATERAL middle ear cavities well-aerated. Soft tissues:  Preseptal soft tissue swelling/ecchymosis ANTERIOR to the RIGHT eye. Ecchymosis/hematoma in the RIGHT cheek beneath the RIGHT eye. No evidence of soft tissue hematoma elsewhere. Note is made of BILATERAL cervical carotid atherosclerosis. CT CERVICAL SPINE FINDINGS Alignment: Anatomic alignment of the cervical spine. Facet joints anatomically aligned throughout with mild degenerative changes. Skull base and vertebrae: No fractures identified involving  the cervical spine. Coronal reformatted images demonstrate an intact craniocervical junction, intact dens and intact lateral masses throughout. Soft tissues and spinal canal: No evidence of paraspinous or spinal canal hematoma. Mild multifactorial spinal stenosis at C5-6. Disc levels: Severe disc space narrowing and associated endplate hypertrophic changes at C4-5, C5-6, C6-7 and C7-T1. Moderate disc space narrowing at C2-3 and C3-4. Uncinate and to a lesser degree facet hypertrophy account for multilevel foraminal stenoses including mild BILATERAL C2-3, severe BILATERAL C3-4, severe RIGHT and moderate LEFT C4-5, severe LEFT and moderate RIGHT C5-6, moderate BILATERAL C6-7, and mild BILATERAL C7-T1. Upper chest: Visualized lung apices clear. Visualized superior mediastinum normal. Other: None. IMPRESSION: 1. Acute left frontal convexity subdural hematoma with maximum thickness of approximately 7 mm. No evidence of midline shift. 2. No fractures identified involving the facial bones. 3. No cervical spine fractures identified. 4. Multilevel degenerative disc disease, spondylosis and facet degenerative changes with multilevel foraminal stenoses as detailed above and mild multifactorial spinal stenosis at C5-6. 5. 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 I telephoned these critical/emergent results to Carmon Sails, PA, of the emergency department at the time of interpretation on 07/09/2018 at 3:54 p.m. Electronically Signed   By: Evangeline Dakin M.D.    On: 07/09/2018 15:55   Ct Cervical Spine Wo Contrast  Result Date: 07/09/2018 CLINICAL DATA:  85 year old who fell earlier today. Patient essentially amnestic to the event and is unsure if he lost consciousness. RIGHT LOWER orbital ecchymosis. Initial encounter. EXAM: CT HEAD WITHOUT CONTRAST CT MAXILLOFACIAL WITHOUT CONTRAST CT CERVICAL SPINE WITHOUT CONTRAST TECHNIQUE: Multidetector CT imaging of the head, cervical spine, and maxillofacial structures were performed using the standard protocol without intravenous contrast. Multiplanar CT image reconstructions of the cervical spine and maxillofacial structures were also generated. COMPARISON:  MRI brain 06/21/2016. No prior cervical spine or maxillofacial imaging. FINDINGS: CT HEAD FINDINGS Brain: Acute LEFT frontal convexity subdural hematoma with maximum thickness of approximately 7 mm. Mild mass effect with effacement of the cortical sulci, but no evidence of midline shift. No evidence of parenchymal contusion/hemorrhage. No evidence of intraventricular or subarachnoid hemorrhage. Moderate age related cortical and deep atrophy. Moderate changes of small vessel disease of the white matter diffusely. No evidence of acute stroke. Vascular: Mild BILATERAL carotid siphon and vertebral artery atherosclerosis. No hyperdense vessel. Skull: No skull fracture or other focal osseous abnormality involving the skull. Other: None. CT MAXILLOFACIAL FINDINGS Osseous: No fractures identified involving the facial bones. Temporomandibular joints anatomically aligned with only mild degenerative changes. Orbits: No orbital fractures. No evidence of orbital hemorrhage on the RIGHT where there is preseptal soft tissue swelling/ecchymosis. Benign senile calcifications noted in both globes. No evidence of hemorrhage involving the RIGHT globe. Sinuses: Near complete opacification of the LEFT maxillary sinus as noted on the prior MRI. Opacifications of multiple BILATERAL ethmoid air  cells. Remaining paranasal sinuses well aerated, with only minimal, insignificant mucosal thickening involving the RIGHT maxillary sinus. Slight bony nasal septal deviation to the LEFT. BILATERAL mastoid air cells and BILATERAL middle ear cavities well-aerated. Soft tissues: Preseptal soft tissue swelling/ecchymosis ANTERIOR to the RIGHT eye. Ecchymosis/hematoma in the RIGHT cheek beneath the RIGHT eye. No evidence of soft tissue hematoma elsewhere. Note is made of BILATERAL cervical carotid atherosclerosis. CT CERVICAL SPINE FINDINGS Alignment: Anatomic alignment of the cervical spine. Facet joints anatomically aligned throughout with mild degenerative changes. Skull base and vertebrae: No fractures identified involving the cervical spine. Coronal reformatted images demonstrate an intact craniocervical junction, intact dens and intact  lateral masses throughout. Soft tissues and spinal canal: No evidence of paraspinous or spinal canal hematoma. Mild multifactorial spinal stenosis at C5-6. Disc levels: Severe disc space narrowing and associated endplate hypertrophic changes at C4-5, C5-6, C6-7 and C7-T1. Moderate disc space narrowing at C2-3 and C3-4. Uncinate and to a lesser degree facet hypertrophy account for multilevel foraminal stenoses including mild BILATERAL C2-3, severe BILATERAL C3-4, severe RIGHT and moderate LEFT C4-5, severe LEFT and moderate RIGHT C5-6, moderate BILATERAL C6-7, and mild BILATERAL C7-T1. Upper chest: Visualized lung apices clear. Visualized superior mediastinum normal. Other: None. IMPRESSION: 1. Acute left frontal convexity subdural hematoma with maximum thickness of approximately 7 mm. No evidence of midline shift. 2. No fractures identified involving the facial bones. 3. No cervical spine fractures identified. 4. Multilevel degenerative disc disease, spondylosis and facet degenerative changes with multilevel foraminal stenoses as detailed above and mild multifactorial spinal stenosis  at C5-6. 5. 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 I telephoned these critical/emergent results to Carmon Sails, PA, of the emergency department at the time of interpretation on 07/09/2018 at 3:54 p.m. Electronically Signed   By: Evangeline Dakin M.D.   On: 07/09/2018 15:55   Dg Humerus Right  Result Date: 07/09/2018 CLINICAL DATA:  Upper arm pain with loss of consciousness. Unsure if fell. EXAM: RIGHT HUMERUS - 2+ VIEW COMPARISON:  None. FINDINGS: The bones appear adequately mineralized. No evidence of acute fracture or dislocation. The alignment is normal at the elbow. The subacromial space is obliterated at the shoulder, consistent with a chronic rotator cuff tear. IMPRESSION: No acute osseous findings. Secondary signs of chronic rotator cuff tear. Electronically Signed   By: Richardean Sale M.D.   On: 07/09/2018 15:24   Ct Maxillofacial Wo Contrast  Result Date: 07/09/2018 CLINICAL DATA:  85 year old who fell earlier today. Patient essentially amnestic to the event and is unsure if he lost consciousness. RIGHT LOWER orbital ecchymosis. Initial encounter. EXAM: CT HEAD WITHOUT CONTRAST CT MAXILLOFACIAL WITHOUT CONTRAST CT CERVICAL SPINE WITHOUT CONTRAST TECHNIQUE: Multidetector CT imaging of the head, cervical spine, and maxillofacial structures were performed using the standard protocol without intravenous contrast. Multiplanar CT image reconstructions of the cervical spine and maxillofacial structures were also generated. COMPARISON:  MRI brain 06/21/2016. No prior cervical spine or maxillofacial imaging. FINDINGS: CT HEAD FINDINGS Brain: Acute LEFT frontal convexity subdural hematoma with maximum thickness of approximately 7 mm. Mild mass effect with effacement of the cortical sulci, but no evidence of midline shift. No evidence of parenchymal contusion/hemorrhage. No evidence of intraventricular or subarachnoid hemorrhage. Moderate age related cortical and deep atrophy.  Moderate changes of small vessel disease of the white matter diffusely. No evidence of acute stroke. Vascular: Mild BILATERAL carotid siphon and vertebral artery atherosclerosis. No hyperdense vessel. Skull: No skull fracture or other focal osseous abnormality involving the skull. Other: None. CT MAXILLOFACIAL FINDINGS Osseous: No fractures identified involving the facial bones. Temporomandibular joints anatomically aligned with only mild degenerative changes. Orbits: No orbital fractures. No evidence of orbital hemorrhage on the RIGHT where there is preseptal soft tissue swelling/ecchymosis. Benign senile calcifications noted in both globes. No evidence of hemorrhage involving the RIGHT globe. Sinuses: Near complete opacification of the LEFT maxillary sinus as noted on the prior MRI. Opacifications of multiple BILATERAL ethmoid air cells. Remaining paranasal sinuses well aerated, with only minimal, insignificant mucosal thickening involving the RIGHT maxillary sinus. Slight bony nasal septal deviation to the LEFT. BILATERAL mastoid air cells and BILATERAL middle ear cavities well-aerated. Soft tissues:  Preseptal soft tissue swelling/ecchymosis ANTERIOR to the RIGHT eye. Ecchymosis/hematoma in the RIGHT cheek beneath the RIGHT eye. No evidence of soft tissue hematoma elsewhere. Note is made of BILATERAL cervical carotid atherosclerosis. CT CERVICAL SPINE FINDINGS Alignment: Anatomic alignment of the cervical spine. Facet joints anatomically aligned throughout with mild degenerative changes. Skull base and vertebrae: No fractures identified involving the cervical spine. Coronal reformatted images demonstrate an intact craniocervical junction, intact dens and intact lateral masses throughout. Soft tissues and spinal canal: No evidence of paraspinous or spinal canal hematoma. Mild multifactorial spinal stenosis at C5-6. Disc levels: Severe disc space narrowing and associated endplate hypertrophic changes at C4-5, C5-6,  C6-7 and C7-T1. Moderate disc space narrowing at C2-3 and C3-4. Uncinate and to a lesser degree facet hypertrophy account for multilevel foraminal stenoses including mild BILATERAL C2-3, severe BILATERAL C3-4, severe RIGHT and moderate LEFT C4-5, severe LEFT and moderate RIGHT C5-6, moderate BILATERAL C6-7, and mild BILATERAL C7-T1. Upper chest: Visualized lung apices clear. Visualized superior mediastinum normal. Other: None. IMPRESSION: 1. Acute left frontal convexity subdural hematoma with maximum thickness of approximately 7 mm. No evidence of midline shift. 2. No fractures identified involving the facial bones. 3. No cervical spine fractures identified. 4. Multilevel degenerative disc disease, spondylosis and facet degenerative changes with multilevel foraminal stenoses as detailed above and mild multifactorial spinal stenosis at C5-6. 5. 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 I telephoned these critical/emergent results to Carmon Sails, PA, of the emergency department at the time of interpretation on 07/09/2018 at 3:54 p.m. Electronically Signed   By: Evangeline Dakin M.D.   On: 07/09/2018 15:55    Procedures .Critical Care Performed by: Kinnie Feil, PA-C Authorized by: Kinnie Feil, PA-C   Critical care provider statement:    Critical care time (minutes):  45   Critical care was necessary to treat or prevent imminent or life-threatening deterioration of the following conditions:  Trauma   Critical care was time spent personally by me on the following activities:  Discussions with consultants, evaluation of patient's response to treatment, examination of patient, ordering and performing treatments and interventions, ordering and review of laboratory studies, ordering and review of radiographic studies, pulse oximetry, re-evaluation of patient's condition, obtaining history from patient or surrogate, review of old charts and development of treatment plan with  patient or surrogate   I assumed direction of critical care for this patient from another provider in my specialty: no     (including critical care time)  Medications Ordered in ED Medications - No data to display   Initial Impression / Assessment and Plan / ED Course  I have reviewed the triage vital signs and the nursing notes.  Pertinent labs & imaging results that were available during my care of the patient were reviewed by me and considered in my medical decision making (see chart for details).  Clinical Course as of Jul 08 1617  Sun Jul 09, 2018  1554 Subdural 7 mm L w/o midline shift per radiology    [CG]  1607 Spoke to neurosurgery, aware of patient    [CG]  1616 1. Acute left frontal convexity subdural hematoma with maximum thickness of approximately 7 mm. No evidence of midline shift.  CT Head Wo Contrast [CG]  1616 Possible nondisplaced fracture of the right 8th rib anterolaterally. No pneumothorax or pleural effusion.  DG Ribs Unilateral W/Chest Right [CG]  1616 IMPRESSION: No acute osseous findings. Secondary signs of chronic rotator cuff tear   DG  Humerus Right [CG]    Clinical Course User Index [CG] Kinnie Feil, PA-C        85 year old here after a witnessed mechanical fall, now has amnesia to events.  Reports pain to the right shoulder and bruising to the right zygomatic arch.  HD stable on arrival.  Concern for right parietal, right shoulder right chest injury.  Imaging obtained accordingly.  Chronic Bell's palsy facial drooping unchanged.  Grossly normal neurological exam otherwise.  No other signs of significant TL spine, abdominal, pelvis, other extremity injury.  Final Clinical Impressions(s) / ED Diagnoses   Imaging remarkable as R rib fracture and SDH.  Discussed with neurosurgery.  Admitted by Dr Linda Hedges. Discussed with EDMD.   Final diagnoses:  SDH (subdural hematoma) ([place])  Fall, initial encounter  Closed fracture of one rib of right  side, initial encounter    ED Discharge Orders    None        Arlean Hopping 07/09/18 1619    Jola Schmidt, MD 07/10/18 2136

## 2018-07-09 NOTE — ED Triage Notes (Signed)
Pt here from home with c/o right shoulder pain and aloc after a fall no blood thinners , pt is not on blood thinners, ? loc

## 2018-07-09 NOTE — ED Notes (Signed)
Patient transported to X-ray 

## 2018-07-09 NOTE — ED Notes (Signed)
ED TO INPATIENT HANDOFF REPORT  ED Nurse Name and Phone #: [phone]  S Name/Age/Gender Bobby Jensen 85 y.o. male Room/Bed: 015C/015C  Code Status   Code Status: Full Code  Home/SNF/Other   Is this baseline? no  Triage Complete: Triage complete  Chief Complaint fall  Triage Note Pt here from home with c/o right shoulder pain and aloc after a fall no blood thinners , pt is not on blood thinners, ? loc   Allergies Allergies  Allergen Reactions  . Fioricet [Butalbital-Apap-Caffeine]     Unaware of this allergy per patient.   . Other Other (See Comments)    Profen II DM= urinary outlet obstruction Unaware of this allergy per patient.  [name] [Guaifenesin]     Unaware of this allergy per patient.    Level of Care/Admitting Diagnosis ED Disposition    ED Disposition Condition [facility] Area: [place] [[id number]]  Level of Care: Med-Surg [16]  I expect the patient will be discharged within 24 hours: Yes  LOW acuity---Tx typically complete <24 hrs---ACUTE conditions typically can be evaluated <24 hours---LABS likely to return to acceptable levels <24 hours---IS near functional baseline---EXPECTED to return to current living arrangement---NOT newly hypoxic: Meets criteria for 5C-Observation unit  Covid Evaluation: N/A  Diagnosis: SDH (subdural hematoma) ([place]) [254270]  Admitting Physician: Neena Rhymes [[id number]]  Attending Physician: Adella Hare E [[id number]]  PT Class (Do Not Modify): Observation [104]  PT Acc Code (Do Not Modify): Observation [10022]       B Medical/Surgery History Past Medical History:  Diagnosis Date  . Bladder neck obstruction 03/28/2008  . Contact with or exposure to tuberculosis 03/15/1992  . Elevated prostate specific antigen (PSA) 03/15/1998  . Gastric ulcer, unspecified as acute or chronic, without mention of hemorrhage, perforation, or obstruction 03/15/1993  . Hypertension 07/08/1998  .  Hypertrophy of prostate without urinary obstruction and other lower urinary tract symptoms (LUTS) 03/15/1989  . 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 Inguinal hernia without mention of obstruction or gangrene, unilateral or unspecified, (not specified as recurrent) 11/20/2008  . Insomnia, unspecified 11/01/2003  . Kidney calculi 08/20/2009  . Other and unspecified hyperlipidemia 12/23/2004  . Other malaise and fatigue 12/23/2004  . Pain in joint, pelvic region and thigh 12/17/2009  . Pain in joint, shoulder region 11/01/2003  . Peptic ulcer, unspecified site, unspecified as acute or chronic, without mention of hemorrhage, perforation, or obstruction 06/25/2003  . Personal history of malaria 03/16/1939  . Rosacea 08/20/2009  . Type II or unspecified type diabetes mellitus without mention of complication, not stated as uncontrolled 07/15/2006  . Unspecified constipation 08/05/2010  . Unspecified hypothyroidism 12/17/2009   Past Surgical History:  Procedure Laterality Date  . APPENDECTOMY  1956  . HEMORROIDECTOMY  06/2002   Dr. Lennie Hummer  . HERNIA REPAIR Bilateral 11/2008   inguinal  . PILONIDAL CYST EXCISION  1963  . PROSTATE SURGERY    . TOTAL HIP ARTHROPLASTY Right 07/10/2009   Dr. Edison Nasuti IV Location/Drains/Wounds Patient Lines/Drains/Airways Status   Active Line/Drains/Airways    Name:   Placement date:   Placement time:   Site:   Days:   Peripheral IV 07/09/18 Left Antecubital   07/09/18    1408    Antecubital   less than 1          Intake/Output Last 24 hours No intake or output data in the 24 hours ending 07/09/18 1738  Labs/Imaging Results for orders placed or performed during the hospital encounter of 07/09/18 (from the past 48 hour(s))  CBC with Differential     Status: Abnormal   Collection Time: 07/09/18  2:45 PM  Result Value Ref Range   WBC 7.3 4.0 - 10.5 K/uL   RBC 4.65 4.22 - 5.81 MIL/uL   Hemoglobin 14.4 13.0 - 17.0 g/dL   HCT 42.2 39.0 -  52.0 %   MCV 90.8 80.0 - 100.0 fL   MCH 31.0 26.0 - 34.0 pg   MCHC 34.1 30.0 - 36.0 g/dL   RDW 13.6 11.5 - 15.5 %   Platelets 143 (L) 150 - 400 K/uL   nRBC 0.0 0.0 - 0.2 %   Neutrophils Relative % 63 %   Neutro Abs 4.5 1.7 - 7.7 K/uL   Lymphocytes Relative 24 %   Lymphs Abs 1.8 0.7 - 4.0 K/uL   Monocytes Relative 8 %   Monocytes Absolute 0.6 0.1 - 1.0 K/uL   Eosinophils Relative 4 %   Eosinophils Absolute 0.3 0.0 - 0.5 K/uL   Basophils Relative 1 %   Basophils Absolute 0.0 0.0 - 0.1 K/uL   Immature Granulocytes 0 %   Abs Immature Granulocytes 0.03 0.00 - 0.07 K/uL    Comment: Performed at [facility], [address] [place], [place] [address]  Basic metabolic panel     Status: Abnormal   Collection Time: 07/09/18  2:45 PM  Result Value Ref Range   Sodium 138 135 - 145 mmol/L   Potassium 4.0 3.5 - 5.1 mmol/L   Chloride 108 98 - 111 mmol/L   CO2 21 (L) 22 - 32 mmol/L   Glucose, Bld 90 70 - 99 mg/dL   BUN 22 8 - 23 mg/dL   Creatinine, Ser 1.08 0.61 - 1.24 mg/dL   Calcium 9.0 8.9 - 10.3 mg/dL   GFR calc non Af Amer >60 >60 mL/min   GFR calc Af Amer >60 >60 mL/min   Anion gap 9 5 - 15    Comment: Performed at [facility] [address], [place], [place] [address]   Dg Ribs Unilateral W/chest Right  Result Date: 07/09/2018 CLINICAL DATA:  Right shoulder and right anterior chest pain. Unsure if fell. EXAM: RIGHT RIBS AND CHEST - 3+ VIEW COMPARISON:  Chest radiographs 10/30/2008. FINDINGS: There are lower lung volumes on the frontal chest examination with resulting mild bibasilar atelectasis. Stable cardiomegaly and aortic atherosclerosis. No pleural effusion or pneumothorax. Possible acute fracture of the right 8th rib anterolaterally. No displaced rib fractures are identified. There are mild degenerative changes throughout the spine. The subacromial space of both shoulders is obliterated, consistent with chronic rotator cuff tears. IMPRESSION: Possible nondisplaced  fracture of the right 8th rib anterolaterally. No pneumothorax or pleural effusion. Electronically Signed   By: Richardean Sale M.D.   On: 07/09/2018 15:26   Dg Shoulder Right  Result Date: 07/09/2018 CLINICAL DATA:  Right shoulder pain.  Unsure of recent fall. EXAM: RIGHT SHOULDER - 2+ VIEW COMPARISON:  None. FINDINGS: The bones appear adequately mineralized. There is no evidence of acute fracture or dislocation. There are glenohumeral degenerative changes with narrowing of the subacromial space of the right shoulder, suggesting a chronic rotator cuff tear. Mild acromioclavicular degenerative changes are present as well. IMPRESSION: No acute osseous findings. Degenerative changes and secondary findings of chronic rotator cuff tear. Electronically Signed   By: Richardean Sale M.D.   On: 07/09/2018 15:23  Ct Head Wo Contrast  Result Date: 07/09/2018 CLINICAL DATA:  85 year old who fell earlier today. Patient essentially amnestic to the event and is unsure if he lost consciousness. RIGHT LOWER orbital ecchymosis. Initial encounter. EXAM: CT HEAD WITHOUT CONTRAST CT MAXILLOFACIAL WITHOUT CONTRAST CT CERVICAL SPINE WITHOUT CONTRAST TECHNIQUE: Multidetector CT imaging of the head, cervical spine, and maxillofacial structures were performed using the standard protocol without intravenous contrast. Multiplanar CT image reconstructions of the cervical spine and maxillofacial structures were also generated. COMPARISON:  MRI brain 06/21/2016. No prior cervical spine or maxillofacial imaging. FINDINGS: CT HEAD FINDINGS Brain: Acute LEFT frontal convexity subdural hematoma with maximum thickness of approximately 7 mm. Mild mass effect with effacement of the cortical sulci, but no evidence of midline shift. No evidence of parenchymal contusion/hemorrhage. No evidence of intraventricular or subarachnoid hemorrhage. Moderate age related cortical and deep atrophy. Moderate changes of small vessel disease of the white  matter diffusely. No evidence of acute stroke. Vascular: Mild BILATERAL carotid siphon and vertebral artery atherosclerosis. No hyperdense vessel. Skull: No skull fracture or other focal osseous abnormality involving the skull. Other: None. CT MAXILLOFACIAL FINDINGS Osseous: No fractures identified involving the facial bones. Temporomandibular joints anatomically aligned with only mild degenerative changes. Orbits: No orbital fractures. No evidence of orbital hemorrhage on the RIGHT where there is preseptal soft tissue swelling/ecchymosis. Benign senile calcifications noted in both globes. No evidence of hemorrhage involving the RIGHT globe. Sinuses: Near complete opacification of the LEFT maxillary sinus as noted on the prior MRI. Opacifications of multiple BILATERAL ethmoid air cells. Remaining paranasal sinuses well aerated, with only minimal, insignificant mucosal thickening involving the RIGHT maxillary sinus. Slight bony nasal septal deviation to the LEFT. BILATERAL mastoid air cells and BILATERAL middle ear cavities well-aerated. Soft tissues: Preseptal soft tissue swelling/ecchymosis ANTERIOR to the RIGHT eye. Ecchymosis/hematoma in the RIGHT cheek beneath the RIGHT eye. No evidence of soft tissue hematoma elsewhere. Note is made of BILATERAL cervical carotid atherosclerosis. CT CERVICAL SPINE FINDINGS Alignment: Anatomic alignment of the cervical spine. Facet joints anatomically aligned throughout with mild degenerative changes. Skull base and vertebrae: No fractures identified involving the cervical spine. Coronal reformatted images demonstrate an intact craniocervical junction, intact dens and intact lateral masses throughout. Soft tissues and spinal canal: No evidence of paraspinous or spinal canal hematoma. Mild multifactorial spinal stenosis at C5-6. Disc levels: Severe disc space narrowing and associated endplate hypertrophic changes at C4-5, C5-6, C6-7 and C7-T1. Moderate disc space narrowing at C2-3  and C3-4. Uncinate and to a lesser degree facet hypertrophy account for multilevel foraminal stenoses including mild BILATERAL C2-3, severe BILATERAL C3-4, severe RIGHT and moderate LEFT C4-5, severe LEFT and moderate RIGHT C5-6, moderate BILATERAL C6-7, and mild BILATERAL C7-T1. Upper chest: Visualized lung apices clear. Visualized superior mediastinum normal. Other: None. IMPRESSION: 1. Acute left frontal convexity subdural hematoma with maximum thickness of approximately 7 mm. No evidence of midline shift. 2. No fractures identified involving the facial bones. 3. No cervical spine fractures identified. 4. Multilevel degenerative disc disease, spondylosis and facet degenerative changes with multilevel foraminal stenoses as detailed above and mild multifactorial spinal stenosis at C5-6. 5. 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 I telephoned these critical/emergent results to Carmon Sails, PA, of the emergency department at the time of interpretation on 07/09/2018 at 3:54 p.m. Electronically Signed   By: Evangeline Dakin M.D.   On: 07/09/2018 15:55   Ct Cervical Spine Wo Contrast  Result Date: 07/09/2018 CLINICAL DATA:  85 year old who fell earlier  today. Patient essentially amnestic to the event and is unsure if he lost consciousness. RIGHT LOWER orbital ecchymosis. Initial encounter. EXAM: CT HEAD WITHOUT CONTRAST CT MAXILLOFACIAL WITHOUT CONTRAST CT CERVICAL SPINE WITHOUT CONTRAST TECHNIQUE: Multidetector CT imaging of the head, cervical spine, and maxillofacial structures were performed using the standard protocol without intravenous contrast. Multiplanar CT image reconstructions of the cervical spine and maxillofacial structures were also generated. COMPARISON:  MRI brain 06/21/2016. No prior cervical spine or maxillofacial imaging. FINDINGS: CT HEAD FINDINGS Brain: Acute LEFT frontal convexity subdural hematoma with maximum thickness of approximately 7 mm. Mild mass effect with  effacement of the cortical sulci, but no evidence of midline shift. No evidence of parenchymal contusion/hemorrhage. No evidence of intraventricular or subarachnoid hemorrhage. Moderate age related cortical and deep atrophy. Moderate changes of small vessel disease of the white matter diffusely. No evidence of acute stroke. Vascular: Mild BILATERAL carotid siphon and vertebral artery atherosclerosis. No hyperdense vessel. Skull: No skull fracture or other focal osseous abnormality involving the skull. Other: None. CT MAXILLOFACIAL FINDINGS Osseous: No fractures identified involving the facial bones. Temporomandibular joints anatomically aligned with only mild degenerative changes. Orbits: No orbital fractures. No evidence of orbital hemorrhage on the RIGHT where there is preseptal soft tissue swelling/ecchymosis. Benign senile calcifications noted in both globes. No evidence of hemorrhage involving the RIGHT globe. Sinuses: Near complete opacification of the LEFT maxillary sinus as noted on the prior MRI. Opacifications of multiple BILATERAL ethmoid air cells. Remaining paranasal sinuses well aerated, with only minimal, insignificant mucosal thickening involving the RIGHT maxillary sinus. Slight bony nasal septal deviation to the LEFT. BILATERAL mastoid air cells and BILATERAL middle ear cavities well-aerated. Soft tissues: Preseptal soft tissue swelling/ecchymosis ANTERIOR to the RIGHT eye. Ecchymosis/hematoma in the RIGHT cheek beneath the RIGHT eye. No evidence of soft tissue hematoma elsewhere. Note is made of BILATERAL cervical carotid atherosclerosis. CT CERVICAL SPINE FINDINGS Alignment: Anatomic alignment of the cervical spine. Facet joints anatomically aligned throughout with mild degenerative changes. Skull base and vertebrae: No fractures identified involving the cervical spine. Coronal reformatted images demonstrate an intact craniocervical junction, intact dens and intact lateral masses throughout. Soft  tissues and spinal canal: No evidence of paraspinous or spinal canal hematoma. Mild multifactorial spinal stenosis at C5-6. Disc levels: Severe disc space narrowing and associated endplate hypertrophic changes at C4-5, C5-6, C6-7 and C7-T1. Moderate disc space narrowing at C2-3 and C3-4. Uncinate and to a lesser degree facet hypertrophy account for multilevel foraminal stenoses including mild BILATERAL C2-3, severe BILATERAL C3-4, severe RIGHT and moderate LEFT C4-5, severe LEFT and moderate RIGHT C5-6, moderate BILATERAL C6-7, and mild BILATERAL C7-T1. Upper chest: Visualized lung apices clear. Visualized superior mediastinum normal. Other: None. IMPRESSION: 1. Acute left frontal convexity subdural hematoma with maximum thickness of approximately 7 mm. No evidence of midline shift. 2. No fractures identified involving the facial bones. 3. No cervical spine fractures identified. 4. Multilevel degenerative disc disease, spondylosis and facet degenerative changes with multilevel foraminal stenoses as detailed above and mild multifactorial spinal stenosis at C5-6. 5. 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 I telephoned these critical/emergent results to Carmon Sails, PA, of the emergency department at the time of interpretation on 07/09/2018 at 3:54 p.m. Electronically Signed   By: Evangeline Dakin M.D.   On: 07/09/2018 15:55   Dg Humerus Right  Result Date: 07/09/2018 CLINICAL DATA:  Upper arm pain with loss of consciousness. Unsure if fell. EXAM: RIGHT HUMERUS - 2+ VIEW COMPARISON:  None. FINDINGS: The  bones appear adequately mineralized. No evidence of acute fracture or dislocation. The alignment is normal at the elbow. The subacromial space is obliterated at the shoulder, consistent with a chronic rotator cuff tear. IMPRESSION: No acute osseous findings. Secondary signs of chronic rotator cuff tear. Electronically Signed   By: Richardean Sale M.D.   On: 07/09/2018 15:24   Ct  Maxillofacial Wo Contrast  Result Date: 07/09/2018 CLINICAL DATA:  85 year old who fell earlier today. Patient essentially amnestic to the event and is unsure if he lost consciousness. RIGHT LOWER orbital ecchymosis. Initial encounter. EXAM: CT HEAD WITHOUT CONTRAST CT MAXILLOFACIAL WITHOUT CONTRAST CT CERVICAL SPINE WITHOUT CONTRAST TECHNIQUE: Multidetector CT imaging of the head, cervical spine, and maxillofacial structures were performed using the standard protocol without intravenous contrast. Multiplanar CT image reconstructions of the cervical spine and maxillofacial structures were also generated. COMPARISON:  MRI brain 06/21/2016. No prior cervical spine or maxillofacial imaging. FINDINGS: CT HEAD FINDINGS Brain: Acute LEFT frontal convexity subdural hematoma with maximum thickness of approximately 7 mm. Mild mass effect with effacement of the cortical sulci, but no evidence of midline shift. No evidence of parenchymal contusion/hemorrhage. No evidence of intraventricular or subarachnoid hemorrhage. Moderate age related cortical and deep atrophy. Moderate changes of small vessel disease of the white matter diffusely. No evidence of acute stroke. Vascular: Mild BILATERAL carotid siphon and vertebral artery atherosclerosis. No hyperdense vessel. Skull: No skull fracture or other focal osseous abnormality involving the skull. Other: None. CT MAXILLOFACIAL FINDINGS Osseous: No fractures identified involving the facial bones. Temporomandibular joints anatomically aligned with only mild degenerative changes. Orbits: No orbital fractures. No evidence of orbital hemorrhage on the RIGHT where there is preseptal soft tissue swelling/ecchymosis. Benign senile calcifications noted in both globes. No evidence of hemorrhage involving the RIGHT globe. Sinuses: Near complete opacification of the LEFT maxillary sinus as noted on the prior MRI. Opacifications of multiple BILATERAL ethmoid air cells. Remaining paranasal  sinuses well aerated, with only minimal, insignificant mucosal thickening involving the RIGHT maxillary sinus. Slight bony nasal septal deviation to the LEFT. BILATERAL mastoid air cells and BILATERAL middle ear cavities well-aerated. Soft tissues: Preseptal soft tissue swelling/ecchymosis ANTERIOR to the RIGHT eye. Ecchymosis/hematoma in the RIGHT cheek beneath the RIGHT eye. No evidence of soft tissue hematoma elsewhere. Note is made of BILATERAL cervical carotid atherosclerosis. CT CERVICAL SPINE FINDINGS Alignment: Anatomic alignment of the cervical spine. Facet joints anatomically aligned throughout with mild degenerative changes. Skull base and vertebrae: No fractures identified involving the cervical spine. Coronal reformatted images demonstrate an intact craniocervical junction, intact dens and intact lateral masses throughout. Soft tissues and spinal canal: No evidence of paraspinous or spinal canal hematoma. Mild multifactorial spinal stenosis at C5-6. Disc levels: Severe disc space narrowing and associated endplate hypertrophic changes at C4-5, C5-6, C6-7 and C7-T1. Moderate disc space narrowing at C2-3 and C3-4. Uncinate and to a lesser degree facet hypertrophy account for multilevel foraminal stenoses including mild BILATERAL C2-3, severe BILATERAL C3-4, severe RIGHT and moderate LEFT C4-5, severe LEFT and moderate RIGHT C5-6, moderate BILATERAL C6-7, and mild BILATERAL C7-T1. Upper chest: Visualized lung apices clear. Visualized superior mediastinum normal. Other: None. IMPRESSION: 1. Acute left frontal convexity subdural hematoma with maximum thickness of approximately 7 mm. No evidence of midline shift. 2. No fractures identified involving the facial bones. 3. No cervical spine fractures identified. 4. Multilevel degenerative disc disease, spondylosis and facet degenerative changes with multilevel foraminal stenoses as detailed above and mild multifactorial spinal stenosis at C5-6. 5. Chronic LEFT  maxillary and BILATERAL ethmoid sinus disease. I telephoned these critical/emergent results to Carmon Sails, PA, of the emergency department at the time of interpretation on 07/09/2018 at 3:54 p.m. Electronically Signed   By: Evangeline Dakin M.D.   On: 07/09/2018 15:55    Pending Labs Unresulted Labs (From admission, onward)   None      Vitals/Pain Today's Vitals   07/09/18 1611 07/09/18 1630 07/09/18 1700 07/09/18 1730  BP: (!) 159/72 (!) 168/73 (!) 153/88 (!) 154/75  Pulse: 63 62 68 69  Resp: 12 18 20 19   Temp:      TempSrc:      SpO2: 98% 97% 100% 96%  PainSc:        Isolation Precautions No active isolations  Medications Medications  nitroGLYCERIN (NITROSTAT) SL tablet 0.4 mg (has no administration in time range)  simvastatin (ZOCOR) tablet 20 mg (has no administration in time range)  ALPRAZolam (XANAX) tablet 0.25 mg (has no administration in time range)  glimepiride (AMARYL) tablet 1 mg (has no administration in time range)  losartan (COZAAR) tablet 100 mg (has no administration in time range)    And  hydrochlorothiazide (HYDRODIURIL) tablet 25 mg (has no administration in time range)  sodium chloride flush (NS) 0.9 % injection 3 mL (has no administration in time range)  sodium chloride flush (NS) 0.9 % injection 3 mL (has no administration in time range)  0.9 %  sodium chloride infusion (has no administration in time range)  acetaminophen (TYLENOL) tablet 650 mg (has no administration in time range)    Or  acetaminophen (TYLENOL) suppository 650 mg (has no administration in time range)    Mobility walks with person assist High fall risk   Focused Assessments    R Recommendations: See Admitting Provider Note  Report given to:   Additional Notes:

## 2018-07-09 NOTE — ED Notes (Signed)
Pt with repetitive questions , does not remember the fall ,

## 2018-07-09 NOTE — H&P (Signed)
History and Physical    Bobby Jensen OEU:[id number] DOB: [birth year] DOA: 07/09/2018  PCP: Gayland Curry, DO   Patient coming from: Home    Chief Complaint: Fall with blow to the head  HPI: Bobby Jensen is a 85 y.o. male with medical history significant of hypertension, hypothyroid disease, type 2 diabetes well-controlled, lipidemia.  Patient was in his usual state of health this morning.  He was visiting with his family and walked out to the patio/deck stumbled and fell striking his head.  He was confused after the fall but evidently did not lose consciousness.  Patient is amnestic in regards to the exact details of the fall.  He remembers waking up in the ambulance.  He does complain of a minor headache but has no other complaints after his fall.  Reports no other injuries..  Patient has significant periorbital ecchymoses on the right but denies any change or loss of vision.  \  ED Course: Patient was hemodynamically stable in the emergency department.  Chest x-ray revealed nondisplaced fracture of the right eighth rib. CT of the head was performed revealing a small subdural hematoma left.  Patient remained awake and alert during his ED stay.  No significant medical interventions were required.  Review of Systems: As per HPI otherwise 10 point review of systems negative.    Past Medical History:  Diagnosis Date  . Bladder neck obstruction 03/28/2008  . Contact with or exposure to tuberculosis 03/15/1992  . Elevated prostate specific antigen (PSA) 03/15/1998  . Gastric ulcer, unspecified as acute or chronic, without mention of hemorrhage, perforation, or obstruction 03/15/1993  . Hypertension 07/08/1998  . Hypertrophy of prostate without urinary obstruction and other lower urinary tract symptoms (LUTS) 03/15/1989  . 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 Inguinal hernia without mention of obstruction or gangrene, unilateral or unspecified, (not specified as recurrent)  11/20/2008  . Insomnia, unspecified 11/01/2003  . Kidney calculi 08/20/2009  . Other and unspecified hyperlipidemia 12/23/2004  . Other malaise and fatigue 12/23/2004  . Pain in joint, pelvic region and thigh 12/17/2009  . Pain in joint, shoulder region 11/01/2003  . Peptic ulcer, unspecified site, unspecified as acute or chronic, without mention of hemorrhage, perforation, or obstruction 06/25/2003  . Personal history of malaria 03/16/1939  . Rosacea 08/20/2009  . Type II or unspecified type diabetes mellitus without mention of complication, not stated as uncontrolled 07/15/2006  . Unspecified constipation 08/05/2010  . Unspecified hypothyroidism 12/17/2009    Past Surgical History:  Procedure Laterality Date  . APPENDECTOMY  1956  . HEMORROIDECTOMY  06/2002   Dr. Lennie Hummer  . HERNIA REPAIR Bilateral 11/2008   inguinal  . PILONIDAL CYST EXCISION  1963  . PROSTATE SURGERY    . TOTAL HIP ARTHROPLASTY Right 07/10/2009   Dr. Telford Nab    Social history -patient has been married for 84 years.  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  He has 2 daughters 1 son 5 grandchildren and 3 great-grandchildren.  He is alone with his wife but his 2 daughters live in [place].  Son is a physician living in [place] and wants to be contacted if necessary.  Son's telephone number is [phone]   reports that he quit smoking about 39 years ago. He has never used smokeless tobacco. He reports current alcohol use. He reports that he does not use drugs.  Allergies  Allergen Reactions  . Fioricet [Butalbital-Apap-Caffeine]     Unaware of  this allergy per patient.   . Other Other (See Comments)    Profen II DM= urinary outlet obstruction Unaware of this allergy per patient.  [name] [Guaifenesin]     Unaware of this allergy per patient.    Family History  Problem Relation Age of Onset  . Heart disease Father    Unacceptable: Noncontributory, unremarkable, or  negative. Acceptable: Family history reviewed and not pertinent (If you reviewed it)  Prior to Admission medications   Medication Sig Start Date End Date Taking? Authorizing Provider  ALPRAZolam (XANAX) 0.25 MG tablet TAKE 1 TABLET BY MOUTH THREE TIMES DAILY Patient taking differently: Take 0.25 mg by mouth 3 (three) times daily as needed for anxiety.  06/27/18  Yes Reed, Tiffany L, DO  aspirin 81 MG tablet Take 81 mg by mouth daily.   Yes [provider]  Cholecalciferol (VITAMIN D PO) Take 1,000 Units by mouth daily.    Yes [provider]  glimepiride (AMARYL) 1 MG tablet TAKE 1 TABLET BY MOUTH EVERY DAY Patient taking differently: Take 1 mg by mouth daily.  12/07/17  Yes Reed, Tiffany L, DO  levothyroxine (SYNTHROID) 50 MCG tablet TAKE 1 TABLET BY MOUTH EVERY DAY FOR THYROID Patient taking differently: Take 50 mcg by mouth daily before breakfast.  07/03/18  Yes Reed, Tiffany L, DO  losartan-hydrochlorothiazide (HYZAAR) 100-25 MG tablet Take 0.5 tablets by mouth daily. 06/23/18  Yes [provider]  nitroGLYCERIN (NITROSTAT) 0.4 MG SL tablet Place 1 tablet (0.4 mg total) under the tongue every 5 (five) minutes as needed for chest pain. 08/27/16  Yes Dorothy Spark, MD  simvastatin (ZOCOR) 20 MG tablet TAKE 1 TABLET BY MOUTH EVERY NIGHT AT BEDTIME Patient taking differently: Take 20 mg by mouth daily at 6 PM.  01/13/18  Yes Reed, Tiffany L, DO  temazepam (RESTORIL) 15 MG capsule TAKE ONE CAPSULE BY MOUTH ONCE DAILY AT BEDTIME AS NEEDED FOR REST Patient taking differently: Take 15 mg by mouth at bedtime as needed for sleep.  06/27/18  Yes Reed, Tiffany L, DO  triamcinolone cream (KENALOG) 0.1 % APPLY EXTERNALLY TO RASH TWICE DAILY AS NEEDED Patient taking differently: Apply 1 application topically 2 (two) times daily as needed (for rash).  02/27/18  Yes Reed, Tiffany L, DO  glucose blood (ONE TOUCH ULTRA TEST) test strip USE TO CHECK BLOOD SUGAR ONCE DAILY AND AS  DIRECTED 12/07/17   Reed, Tiffany L, DO  losartan (COZAAR) 100 MG tablet Take 1 tablet (100 mg total) by mouth daily. Patient not taking: Reported on 07/09/2018 12/09/17   Gayland Curry, DO  [facility] DELICA LANCETS 85M MISC Check blood sugar once daily as directed E11.22 12/12/17   Gayland Curry, DO    Physical Exam: Vitals:   07/09/18 1407 07/09/18 1415 07/09/18 1430 07/09/18 1611  BP: (!) 169/119 (!) 164/72 (!) 174/84 (!) 159/72  Pulse: 65 61 69 63  Resp: 20 16 (!) 23 12  Temp: 97.6 F (36.4 C)     TempSrc: Oral     SpO2: 96% 94% 100% 98%    Constitutional: NAD, calm, comfortable Vitals:   07/09/18 1407 07/09/18 1415 07/09/18 1430 07/09/18 1611  BP: (!) 169/119 (!) 164/72 (!) 174/84 (!) 159/72  Pulse: 65 61 69 63  Resp: 20 16 (!) 23 12  Temp: 97.6 F (36.4 C)     TempSrc: Oral     SpO2: 96% 94% 100% 98%   General appearance: Well-nourished well-developed male in no acute  distress.  Is awake and conversant during the examination.   Eyes: Significant swelling and ecchymoses in the right periorbital area.  He has a lateral subconjunctival hemorrhage.  He has small cataract in both lenses but pupils are equal and reactive.  ENMT: Nose is midline.  Mucous membranes are moist. Posterior pharynx clear of any exudate or lesions.Normal dentition.  Neck: normal, supple, no masses, no thyromegaly Respiratory: clear to auscultation bilaterally, no wheezing, no crackles. Normal respiratory effort. No accessory muscle use.  Cardiovascular: Regular rate and rhythm, no murmurs / rubs / gallops. No extremity edema. 2+ pedal pulses. No carotid bruits.  Abdomen: no tenderness, no masses palpated. No hepatosplenomegaly. Bowel sounds positive.  Musculoskeletal: no clubbing / cyanosis. No joint deformity upper and lower extremities. Good ROM, no contractures. Normal muscle tone.  Skin: no rashes, lesions, ulcers. No induration Neurologic: Awake alert and oriented x3.  CN 2-12 grossly intact.  Sensation intact, DTR normal. Strength 5/5 in all 4.  No focal findings. Psychiatric: Normal judgment and insight. Alert and oriented x 3. Normal mood.   Labs on Admission: I have personally reviewed following labs and imaging studies  CBC: Recent Labs  Lab 07/09/18 1445  WBC 7.3  NEUTROABS 4.5  HGB 14.4  HCT 42.2  MCV 90.8  PLT 852*   Basic Metabolic Panel: Recent Labs  Lab 07/09/18 1445  NA 138  K 4.0  CL 108  CO2 21*  GLUCOSE 90  BUN 22  CREATININE 1.08  CALCIUM 9.0   GFR: CrCl cannot be calculated (Unknown ideal weight.). Liver Function Tests: No results for input(s): AST, ALT, ALKPHOS, BILITOT, PROT, ALBUMIN in the last 168 hours. No results for input(s): LIPASE, AMYLASE in the last 168 hours. No results for input(s): AMMONIA in the last 168 hours. Coagulation Profile: No results for input(s): INR, PROTIME in the last 168 hours. Cardiac Enzymes: No results for input(s): CKTOTAL, CKMB, CKMBINDEX, TROPONINI in the last 168 hours. BNP (last 3 results) No results for input(s): PROBNP in the last 8760 hours. HbA1C: No results for input(s): HGBA1C in the last 72 hours. CBG: No results for input(s): GLUCAP in the last 168 hours. Lipid Profile: No results for input(s): CHOL, HDL, LDLCALC, TRIG, CHOLHDL, LDLDIRECT in the last 72 hours. Thyroid Function Tests: No results for input(s): TSH, T4TOTAL, FREET4, T3FREE, THYROIDAB in the last 72 hours. Anemia Panel: No results for input(s): VITAMINB12, FOLATE, FERRITIN, TIBC, IRON, RETICCTPCT in the last 72 hours. Urine analysis:    Component Value Date/Time   COLORURINE YELLOW 07/03/2009 1120   APPEARANCEUR CLEAR 07/03/2009 1120   LABSPEC >=1.030 07/31/2011 1629   PHURINE 5.0 07/31/2011 1629   GLUCOSEU NEGATIVE 07/31/2011 1629   HGBUR LARGE (A) 07/31/2011 1629   BILIRUBINUR SMALL (A) 07/31/2011 1629   KETONESUR TRACE (A) 07/31/2011 1629   PROTEINUR 100 (A) 07/31/2011 1629   UROBILINOGEN 0.2 07/31/2011 1629    NITRITE NEGATIVE 07/31/2011 1629   LEUKOCYTESUR NEGATIVE 07/31/2011 1629    Radiological Exams on Admission: Dg Ribs Unilateral W/chest Right  Result Date: 07/09/2018 CLINICAL DATA:  Right shoulder and right anterior chest pain. Unsure if fell. EXAM: RIGHT RIBS AND CHEST - 3+ VIEW COMPARISON:  Chest radiographs 10/30/2008. FINDINGS: There are lower lung volumes on the frontal chest examination with resulting mild bibasilar atelectasis. Stable cardiomegaly and aortic atherosclerosis. No pleural effusion or pneumothorax. Possible acute fracture of the right 8th rib anterolaterally. No displaced rib fractures are identified. There are mild degenerative changes throughout the spine. The subacromial  space of both shoulders is obliterated, consistent with chronic rotator cuff tears. IMPRESSION: Possible nondisplaced fracture of the right 8th rib anterolaterally. No pneumothorax or pleural effusion. Electronically Signed   By: Richardean Sale M.D.   On: 07/09/2018 15:26   Dg Shoulder Right  Result Date: 07/09/2018 CLINICAL DATA:  Right shoulder pain.  Unsure of recent fall. EXAM: RIGHT SHOULDER - 2+ VIEW COMPARISON:  None. FINDINGS: The bones appear adequately mineralized. There is no evidence of acute fracture or dislocation. There are glenohumeral degenerative changes with narrowing of the subacromial space of the right shoulder, suggesting a chronic rotator cuff tear. Mild acromioclavicular degenerative changes are present as well. IMPRESSION: No acute osseous findings. Degenerative changes and secondary findings of chronic rotator cuff tear. Electronically Signed   By: Richardean Sale M.D.   On: 07/09/2018 15:23   Ct Head Wo Contrast  Result Date: 07/09/2018 CLINICAL DATA:  85 year old who fell earlier today. Patient essentially amnestic to the event and is unsure if he lost consciousness. RIGHT LOWER orbital ecchymosis. Initial encounter. EXAM: CT HEAD WITHOUT CONTRAST CT MAXILLOFACIAL WITHOUT CONTRAST  CT CERVICAL SPINE WITHOUT CONTRAST TECHNIQUE: Multidetector CT imaging of the head, cervical spine, and maxillofacial structures were performed using the standard protocol without intravenous contrast. Multiplanar CT image reconstructions of the cervical spine and maxillofacial structures were also generated. COMPARISON:  MRI brain 06/21/2016. No prior cervical spine or maxillofacial imaging. FINDINGS: CT HEAD FINDINGS Brain: Acute LEFT frontal convexity subdural hematoma with maximum thickness of approximately 7 mm. Mild mass effect with effacement of the cortical sulci, but no evidence of midline shift. No evidence of parenchymal contusion/hemorrhage. No evidence of intraventricular or subarachnoid hemorrhage. Moderate age related cortical and deep atrophy. Moderate changes of small vessel disease of the white matter diffusely. No evidence of acute stroke. Vascular: Mild BILATERAL carotid siphon and vertebral artery atherosclerosis. No hyperdense vessel. Skull: No skull fracture or other focal osseous abnormality involving the skull. Other: None. CT MAXILLOFACIAL FINDINGS Osseous: No fractures identified involving the facial bones. Temporomandibular joints anatomically aligned with only mild degenerative changes. Orbits: No orbital fractures. No evidence of orbital hemorrhage on the RIGHT where there is preseptal soft tissue swelling/ecchymosis. Benign senile calcifications noted in both globes. No evidence of hemorrhage involving the RIGHT globe. Sinuses: Near complete opacification of the LEFT maxillary sinus as noted on the prior MRI. Opacifications of multiple BILATERAL ethmoid air cells. Remaining paranasal sinuses well aerated, with only minimal, insignificant mucosal thickening involving the RIGHT maxillary sinus. Slight bony nasal septal deviation to the LEFT. BILATERAL mastoid air cells and BILATERAL middle ear cavities well-aerated. Soft tissues: Preseptal soft tissue swelling/ecchymosis ANTERIOR to the  RIGHT eye. Ecchymosis/hematoma in the RIGHT cheek beneath the RIGHT eye. No evidence of soft tissue hematoma elsewhere. Note is made of BILATERAL cervical carotid atherosclerosis. CT CERVICAL SPINE FINDINGS Alignment: Anatomic alignment of the cervical spine. Facet joints anatomically aligned throughout with mild degenerative changes. Skull base and vertebrae: No fractures identified involving the cervical spine. Coronal reformatted images demonstrate an intact craniocervical junction, intact dens and intact lateral masses throughout. Soft tissues and spinal canal: No evidence of paraspinous or spinal canal hematoma. Mild multifactorial spinal stenosis at C5-6. Disc levels: Severe disc space narrowing and associated endplate hypertrophic changes at C4-5, C5-6, C6-7 and C7-T1. Moderate disc space narrowing at C2-3 and C3-4. Uncinate and to a lesser degree facet hypertrophy account for multilevel foraminal stenoses including mild BILATERAL C2-3, severe BILATERAL C3-4, severe RIGHT and moderate LEFT C4-5, severe LEFT  and moderate RIGHT C5-6, moderate BILATERAL C6-7, and mild BILATERAL C7-T1. Upper chest: Visualized lung apices clear. Visualized superior mediastinum normal. Other: None. IMPRESSION: 1. Acute left frontal convexity subdural hematoma with maximum thickness of approximately 7 mm. No evidence of midline shift. 2. No fractures identified involving the facial bones. 3. No cervical spine fractures identified. 4. Multilevel degenerative disc disease, spondylosis and facet degenerative changes with multilevel foraminal stenoses as detailed above and mild multifactorial spinal stenosis at C5-6. 5. 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 I telephoned these critical/emergent results to Carmon Sails, PA, of the emergency department at the time of interpretation on 07/09/2018 at 3:54 p.m. Electronically Signed   By: Evangeline Dakin M.D.   On: 07/09/2018 15:55   Ct Cervical Spine Wo  Contrast  Result Date: 07/09/2018 CLINICAL DATA:  85 year old who fell earlier today. Patient essentially amnestic to the event and is unsure if he lost consciousness. RIGHT LOWER orbital ecchymosis. Initial encounter. EXAM: CT HEAD WITHOUT CONTRAST CT MAXILLOFACIAL WITHOUT CONTRAST CT CERVICAL SPINE WITHOUT CONTRAST TECHNIQUE: Multidetector CT imaging of the head, cervical spine, and maxillofacial structures were performed using the standard protocol without intravenous contrast. Multiplanar CT image reconstructions of the cervical spine and maxillofacial structures were also generated. COMPARISON:  MRI brain 06/21/2016. No prior cervical spine or maxillofacial imaging. FINDINGS: CT HEAD FINDINGS Brain: Acute LEFT frontal convexity subdural hematoma with maximum thickness of approximately 7 mm. Mild mass effect with effacement of the cortical sulci, but no evidence of midline shift. No evidence of parenchymal contusion/hemorrhage. No evidence of intraventricular or subarachnoid hemorrhage. Moderate age related cortical and deep atrophy. Moderate changes of small vessel disease of the white matter diffusely. No evidence of acute stroke. Vascular: Mild BILATERAL carotid siphon and vertebral artery atherosclerosis. No hyperdense vessel. Skull: No skull fracture or other focal osseous abnormality involving the skull. Other: None. CT MAXILLOFACIAL FINDINGS Osseous: No fractures identified involving the facial bones. Temporomandibular joints anatomically aligned with only mild degenerative changes. Orbits: No orbital fractures. No evidence of orbital hemorrhage on the RIGHT where there is preseptal soft tissue swelling/ecchymosis. Benign senile calcifications noted in both globes. No evidence of hemorrhage involving the RIGHT globe. Sinuses: Near complete opacification of the LEFT maxillary sinus as noted on the prior MRI. Opacifications of multiple BILATERAL ethmoid air cells. Remaining paranasal sinuses well aerated,  with only minimal, insignificant mucosal thickening involving the RIGHT maxillary sinus. Slight bony nasal septal deviation to the LEFT. BILATERAL mastoid air cells and BILATERAL middle ear cavities well-aerated. Soft tissues: Preseptal soft tissue swelling/ecchymosis ANTERIOR to the RIGHT eye. Ecchymosis/hematoma in the RIGHT cheek beneath the RIGHT eye. No evidence of soft tissue hematoma elsewhere. Note is made of BILATERAL cervical carotid atherosclerosis. CT CERVICAL SPINE FINDINGS Alignment: Anatomic alignment of the cervical spine. Facet joints anatomically aligned throughout with mild degenerative changes. Skull base and vertebrae: No fractures identified involving the cervical spine. Coronal reformatted images demonstrate an intact craniocervical junction, intact dens and intact lateral masses throughout. Soft tissues and spinal canal: No evidence of paraspinous or spinal canal hematoma. Mild multifactorial spinal stenosis at C5-6. Disc levels: Severe disc space narrowing and associated endplate hypertrophic changes at C4-5, C5-6, C6-7 and C7-T1. Moderate disc space narrowing at C2-3 and C3-4. Uncinate and to a lesser degree facet hypertrophy account for multilevel foraminal stenoses including mild BILATERAL C2-3, severe BILATERAL C3-4, severe RIGHT and moderate LEFT C4-5, severe LEFT and moderate RIGHT C5-6, moderate BILATERAL C6-7, and mild BILATERAL C7-T1. Upper chest: Visualized lung  apices clear. Visualized superior mediastinum normal. Other: None. IMPRESSION: 1. Acute left frontal convexity subdural hematoma with maximum thickness of approximately 7 mm. No evidence of midline shift. 2. No fractures identified involving the facial bones. 3. No cervical spine fractures identified. 4. Multilevel degenerative disc disease, spondylosis and facet degenerative changes with multilevel foraminal stenoses as detailed above and mild multifactorial spinal stenosis at C5-6. 5. Chronic LEFT maxillary and BILATERAL  ethmoid sinus disease. I telephoned these critical/emergent results to Carmon Sails, PA, of the emergency department at the time of interpretation on 07/09/2018 at 3:54 p.m. Electronically Signed   By: Evangeline Dakin M.D.   On: 07/09/2018 15:55   Dg Humerus Right  Result Date: 07/09/2018 CLINICAL DATA:  Upper arm pain with loss of consciousness. Unsure if fell. EXAM: RIGHT HUMERUS - 2+ VIEW COMPARISON:  None. FINDINGS: The bones appear adequately mineralized. No evidence of acute fracture or dislocation. The alignment is normal at the elbow. The subacromial space is obliterated at the shoulder, consistent with a chronic rotator cuff tear. IMPRESSION: No acute osseous findings. Secondary signs of chronic rotator cuff tear. Electronically Signed   By: Richardean Sale M.D.   On: 07/09/2018 15:24   Ct Maxillofacial Wo Contrast  Result Date: 07/09/2018 CLINICAL DATA:  85 year old who fell earlier today. Patient essentially amnestic to the event and is unsure if he lost consciousness. RIGHT LOWER orbital ecchymosis. Initial encounter. EXAM: CT HEAD WITHOUT CONTRAST CT MAXILLOFACIAL WITHOUT CONTRAST CT CERVICAL SPINE WITHOUT CONTRAST TECHNIQUE: Multidetector CT imaging of the head, cervical spine, and maxillofacial structures were performed using the standard protocol without intravenous contrast. Multiplanar CT image reconstructions of the cervical spine and maxillofacial structures were also generated. COMPARISON:  MRI brain 06/21/2016. No prior cervical spine or maxillofacial imaging. FINDINGS: CT HEAD FINDINGS Brain: Acute LEFT frontal convexity subdural hematoma with maximum thickness of approximately 7 mm. Mild mass effect with effacement of the cortical sulci, but no evidence of midline shift. No evidence of parenchymal contusion/hemorrhage. No evidence of intraventricular or subarachnoid hemorrhage. Moderate age related cortical and deep atrophy. Moderate changes of small vessel disease of the white  matter diffusely. No evidence of acute stroke. Vascular: Mild BILATERAL carotid siphon and vertebral artery atherosclerosis. No hyperdense vessel. Skull: No skull fracture or other focal osseous abnormality involving the skull. Other: None. CT MAXILLOFACIAL FINDINGS Osseous: No fractures identified involving the facial bones. Temporomandibular joints anatomically aligned with only mild degenerative changes. Orbits: No orbital fractures. No evidence of orbital hemorrhage on the RIGHT where there is preseptal soft tissue swelling/ecchymosis. Benign senile calcifications noted in both globes. No evidence of hemorrhage involving the RIGHT globe. Sinuses: Near complete opacification of the LEFT maxillary sinus as noted on the prior MRI. Opacifications of multiple BILATERAL ethmoid air cells. Remaining paranasal sinuses well aerated, with only minimal, insignificant mucosal thickening involving the RIGHT maxillary sinus. Slight bony nasal septal deviation to the LEFT. BILATERAL mastoid air cells and BILATERAL middle ear cavities well-aerated. Soft tissues: Preseptal soft tissue swelling/ecchymosis ANTERIOR to the RIGHT eye. Ecchymosis/hematoma in the RIGHT cheek beneath the RIGHT eye. No evidence of soft tissue hematoma elsewhere. Note is made of BILATERAL cervical carotid atherosclerosis. CT CERVICAL SPINE FINDINGS Alignment: Anatomic alignment of the cervical spine. Facet joints anatomically aligned throughout with mild degenerative changes. Skull base and vertebrae: No fractures identified involving the cervical spine. Coronal reformatted images demonstrate an intact craniocervical junction, intact dens and intact lateral masses throughout. Soft tissues and spinal canal: No evidence of paraspinous or spinal  canal hematoma. Mild multifactorial spinal stenosis at C5-6. Disc levels: Severe disc space narrowing and associated endplate hypertrophic changes at C4-5, C5-6, C6-7 and C7-T1. Moderate disc space narrowing at C2-3  and C3-4. Uncinate and to a lesser degree facet hypertrophy account for multilevel foraminal stenoses including mild BILATERAL C2-3, severe BILATERAL C3-4, severe RIGHT and moderate LEFT C4-5, severe LEFT and moderate RIGHT C5-6, moderate BILATERAL C6-7, and mild BILATERAL C7-T1. Upper chest: Visualized lung apices clear. Visualized superior mediastinum normal. Other: None. IMPRESSION: 1. Acute left frontal convexity subdural hematoma with maximum thickness of approximately 7 mm. No evidence of midline shift. 2. No fractures identified involving the facial bones. 3. No cervical spine fractures identified. 4. Multilevel degenerative disc disease, spondylosis and facet degenerative changes with multilevel foraminal stenoses as detailed above and mild multifactorial spinal stenosis at C5-6. 5. 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 I telephoned these critical/emergent results to Carmon Sails, PA, of the emergency department at the time of interpretation on 07/09/2018 at 3:54 p.m. Electronically Signed   By: Evangeline Dakin M.D.   On: 07/09/2018 15:55    EKG: Independently reviewed.  EKG reveals sinus rhythm with multiple premature ventricular and atrial contractions.  Right bundle branch block is noted.  No acute changes are noted.  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  SDH -patient with a small left-sided frontal SDH with a maximum thickness of 7 mm.  There is no midline shift.  She has no other injuries specifically no skull fracture.  Cervical spine was cleared but he does have significant degenerative disc disease.  Patient has significant chronic sinus disease.  Patient on examination is awake and alert with no focal findings on examination. Plan patient is to be admitted to an observation unit.  Neuro checks every 4 hours  Follow-up CT  scan brain without contrast in the a.m.  Surgical consult has been called but do not anticipate need for intervention.  2.  Hypertension   patient's blood pressure mildly elevated in the emergency department.  May be related to his trauma and situation.  No indication for acute intervention.  Favor permissive blood pressure control in the current setting. Plan continue home medications  3.  NIDDM-patient reports he has been well controlled.  He reports his last hemoglobin A1c was 6.2%.  He takes glimepiride daily and admits to skipping some doses Plan  will hold oral medication overnight.  Will follow with CBGs before meals and at bedtime with sliding scale.  4.   Hyperlipidemia -we will continue the patient's medications.  Will allow his primary care physician to follow his routine labs.  CODE STATUS.  Patient is a full code for a full discussion with both the patient and his wife.  DVT prophylaxis: SCDs (Lovenox/Heparin/SCD's/anticoagulated/None (if comfort care) Code Status: Full code (Full/Partial (specify details) Family Communication: Spoke with the patient's wife, daughter and son who is an OB/GYN retired in [place] (Kelly name, relationship. Do not write "discussed with patient". Specify tel # if discussed over the phone) Disposition Plan: Home when stable (specify when and where you expect patient to be discharged) Consults called: Neurosurgeon on-call (with names) Admission status: Aggie Moats (inpatient / obs / tele / medical floor / SDU)   Adella Hare MD [facility] Pager [phone]  If 7PM-7AM, please contact night-coverage [email or web address] Password [facility]  07/09/2018,  5:24 PM

## 2018-07-10 ENCOUNTER — Observation Stay (HOSPITAL_COMMUNITY): Payer: Medicare Other

## 2018-07-10 ENCOUNTER — Telehealth: Payer: Self-pay | Admitting: *Deleted

## 2018-07-10 ENCOUNTER — Encounter (HOSPITAL_COMMUNITY): Payer: Self-pay | Admitting: Internal Medicine

## 2018-07-10 DIAGNOSIS — S065X9A Traumatic subdural hemorrhage with loss of consciousness of unspecified duration, initial encounter: Secondary | ICD-10-CM | POA: Diagnosis not present

## 2018-07-10 DIAGNOSIS — S4991XA Unspecified injury of right shoulder and upper arm, initial encounter: Secondary | ICD-10-CM

## 2018-07-10 DIAGNOSIS — S065X0D Traumatic subdural hemorrhage without loss of consciousness, subsequent encounter: Secondary | ICD-10-CM | POA: Diagnosis not present

## 2018-07-10 DIAGNOSIS — I6201 Nontraumatic acute subdural hemorrhage: Secondary | ICD-10-CM | POA: Diagnosis not present

## 2018-07-10 DIAGNOSIS — M75101 Unspecified rotator cuff tear or rupture of right shoulder, not specified as traumatic: Secondary | ICD-10-CM | POA: Diagnosis present

## 2018-07-10 HISTORY — DX: Unspecified injury of right shoulder and upper arm, initial encounter: S49.91XA

## 2018-07-10 HISTORY — DX: Unspecified rotator cuff tear or rupture of right shoulder, not specified as traumatic: M75.101

## 2018-07-10 LAB — GLUCOSE, CAPILLARY: Glucose-Capillary: 143 mg/dL — ABNORMAL HIGH (ref 70–99)

## 2018-07-10 MED ORDER — TEMAZEPAM 7.5 MG PO CAPS
15.0000 mg | ORAL_CAPSULE | Freq: Once | ORAL | Status: AC
  Administered 2018-07-10: 15 mg via ORAL
  Filled 2018-07-10: qty 2

## 2018-07-10 MED ORDER — INSULIN ASPART 100 UNIT/ML ~~LOC~~ SOLN: 0.0000 [IU] | Freq: Every day | SUBCUTANEOUS | Status: DC

## 2018-07-10 MED ORDER — ACETAMINOPHEN 325 MG PO TABS: 650.0000 mg | ORAL_TABLET | Freq: Four times a day (QID) | ORAL | Status: DC | PRN

## 2018-07-10 MED ORDER — ALPRAZOLAM 0.25 MG PO TABS: 0.2500 mg | ORAL_TABLET | Freq: Once | ORAL | Status: DC

## 2018-07-10 MED ORDER — ASPIRIN 81 MG PO TABS: 81.0000 mg | ORAL_TABLET | Freq: Every day | ORAL | Status: DC

## 2018-07-10 MED ORDER — INSULIN ASPART 100 UNIT/ML ~~LOC~~ SOLN
0.0000 [IU] | Freq: Three times a day (TID) | SUBCUTANEOUS | Status: DC
  Administered 2018-07-10: 12:00:00 1 [IU] via SUBCUTANEOUS

## 2018-07-10 NOTE — Discharge Instructions (Addendum)
Follow up with Dr. Zada Finders (Neurosurgery) in 2 weeks after discharge. Please call his office at [phone] to schedule a follow up appointment. If you have any concerns or questions, please call the office and let us know.

## 2018-07-10 NOTE — Telephone Encounter (Signed)
Transition Care Management Follow-up Telephone Call  Date of discharge and from where: 07/10/2018 [facility]  How have you been since you were released from the hospital? Better, have Hematoma on side of head and black eye  Any questions or concerns? No   Items Reviewed:  Did the pt receive and understand the discharge instructions provided? Yes   Medications obtained and verified? Yes   Any new allergies since your discharge? No   Dietary orders reviewed? Yes  Do you have support at home? Yes Wife  Other (ie: DME, Home Health, etc) No, stated he doesn't need help  Functional Questionnaire: (I = Independent and D = Dependent) ADL's: I  Bathing/Dressing- I   Meal Prep- I, wife assistance  Eating- I  Maintaining continence- I  Transferring/Ambulation- I  Managing Meds- I   Follow up appointments reviewed:    PCP Hospital f/u appt confirmed? Yes  Scheduled for TeleVisit with Dr. Mariea Clonts on 07/13/18.  [facility] f/u appt confirmed? Yes    Are transportation arrangements needed? No   If their condition worsens, is the pt aware to call  their PCP or go to the ED? Yes  Was the patient provided with contact information for the PCP's office or ED? Yes  Was the pt encouraged to call back with questions or concerns? Yes

## 2018-07-10 NOTE — TOC Transition Note (Signed)
Transition of Care [facility]) - CM/SW Discharge Note   Patient Details  Name: Bobby Jensen MRN: [id number] Date of Birth: May 20, 1927  [facility] CM/SW Contact:  Pollie Friar, RN Phone Number: 07/10/2018, 12:35 PM   Clinical Narrative:    Pt has transportation home.  CM consulted for outpatient therapy. Pt is refusing stating his shoulder problems are chronic and he doesn't need therapy for this. He has his own exercises he does.    Final next level of care: Home/Self Care Barriers to Discharge: Barriers Resolved   Patient Goals and CMS Choice Patient states their goals for this hospitalization and ongoing recovery are:: not to fall again       Discharge Placement                       Discharge Plan and Services                                     Social Determinants of Health (SDOH) Interventions     Readmission Risk Interventions No flowsheet data found.
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
## 2018-07-10 NOTE — Discharge Summary (Addendum)
Physician Discharge Summary  Fitzroy Mikami Bonano KAJ:[id number] DOB: [birth year] DOA: 07/09/2018  PCP: Gayland Curry, DO  Admit date: 07/09/2018 Discharge date: 07/10/2018  Time spent: 45 minutes  Recommendations for Outpatient Follow-up:  1. Follow up with Dr Sherlyn Lick in 2 weeks regarding SDH 2. Outpatient PT for eval and treatment of right shoulder injury in setting of chronic right rotator cuff tear to avoid frozen shouder 3. Incentive spirometry every 2 hours while awake to avoid complications from fx rib   Discharge Diagnoses:  Principal Problem:   SDH (subdural hematoma) (HCC) Active Problems:   Hypertension   Hypothyroidism   Type II diabetes mellitus with stage 3 chronic kidney disease (HCC)   Right rotator cuff tear   Right shoulder injury   Discharge Condition: stable  Diet recommendation: heart healthy carb modified  There were no vitals filed for this visit.  History of present illness:  Aariz T Weant is a 85 y.o. male with medical history significant of hypertension, hypothyroid disease, type 2 diabetes well-controlled, lipidemia.  Patient was in his usual state of health until 4/26.  He was visiting with his family and walked out to the patio/deck stumbled and fell striking his head.  He was confused after the fall but evidently did not lose consciousness.  Patient was amnestic in regards to the exact details of the fall.  He remembered waking up in the ambulance.  He complained of a minor headache but had no other complaints after his fall.  Reported no other injuries..  Patient has significant periorbital ecchymoses on the right but denies any change or loss of vision.  \  Hospital Course:  1.  SDH -patient with a small left-sided frontal SDH with a maximum thickness of 7 mm.  There is no midline shift. No skull fracture.  Cervical spine was cleared. At time of admission is awake and alert with no focal findings on examination. CT repeated and evaluated by  neurosurgery who opined no neurosurgical intervention indicated and patient back to baseline. Recommend no aspirin for 2 weeks and follow up with neurosurgery in 2 weeks.   2.  Hypertension. Fair control.  continue home medications  3.  NIDDM-patient reported he had been well controlled.  He reported his last hemoglobin A1c was 6.2%. resume home regimin  4.   Hyperlipidemia -resume home meds   Procedures:    Consultations:  Dr Sherlyn Lick neurosurgery  Discharge Exam: Vitals:   07/10/18 0804 07/10/18 1221  BP: (!) 149/72 139/73  Pulse: (!) 55 (!) 57  Resp: 18 18  Temp: 97.8 F (36.6 C) 98.8 F (37.1 C)  SpO2: 97% 96%    General: awake alert ambulating in room with steady gait Cardiovascular: RRR no mgr no LE edema Respiratory: normal effort BS clear bilaterally no wheeze Neuro: alert oriented x3. Speech clear facial symetry bilateral grip 5/5 MS: joints without swelling/erythema. Right shoulder with decreased rom due to pain. Right radial pulse intact. Right fingers warm and dry  Discharge Instructions   Discharge Instructions    Call MD for:  persistant dizziness or light-headedness   Complete by:  As directed    Call MD for:  persistant nausea and vomiting   Complete by:  As directed    Diet - low sodium heart healthy   Complete by:  As directed    Discharge instructions   Complete by:  As directed    Hold aspirin for 2 weeks to resume 07/24/18 Follow up with dr. Zada Finders in 2  weeks for follow up.  Recommend outpatient PT for evaluation and treatment of right shoulder injury due to decreased range of motion and to avoid frozen shoulder   Increase activity slowly   Complete by:  As directed      Allergies as of 07/10/2018      Reactions   Fioricet [butalbital-apap-caffeine]    Unaware of this allergy per patient.   Other Other (See Comments)   Profen II DM= urinary outlet obstruction Unaware of this allergy per patient.   Tussin [guaifenesin]    Unaware  of this allergy per patient.      Medication List    STOP taking these medications   losartan-hydrochlorothiazide 100-25 MG tablet Commonly known as:  HYZAAR     TAKE these medications   acetaminophen 325 MG tablet Commonly known as:  TYLENOL Take 2 tablets (650 mg total) by mouth every 6 (six) hours as needed for mild pain (or Fever >/= 101).   ALPRAZolam 0.25 MG tablet Commonly known as:  XANAX TAKE 1 TABLET BY MOUTH THREE TIMES DAILY What changed:    when to take this  reasons to take this   aspirin 81 MG tablet Take 1 tablet (81 mg total) by mouth daily. Start taking on:  Jul 24, 2018 What changed:  These instructions start on Jul 24, 2018. If you are unsure what to do until then, ask your doctor or other care provider.   glimepiride 1 MG tablet Commonly known as:  AMARYL TAKE 1 TABLET BY MOUTH EVERY DAY   glucose blood test strip Commonly known as:  ONE TOUCH ULTRA TEST USE TO CHECK BLOOD SUGAR ONCE DAILY AND AS DIRECTED   levothyroxine 50 MCG tablet Commonly known as:  SYNTHROID TAKE 1 TABLET BY MOUTH EVERY DAY FOR THYROID What changed:  See the new instructions.   losartan 100 MG tablet Commonly known as:  COZAAR Take 1 tablet (100 mg total) by mouth daily.   nitroGLYCERIN 0.4 MG SL tablet Commonly known as:  NITROSTAT Place 1 tablet (0.4 mg total) under the tongue every 5 (five) minutes as needed for chest pain.   OneTouch Delica Lancets 78L Misc Check blood sugar once daily as directed E11.22   simvastatin 20 MG tablet Commonly known as:  ZOCOR TAKE 1 TABLET BY MOUTH EVERY NIGHT AT BEDTIME What changed:  when to take this   temazepam 15 MG capsule Commonly known as:  RESTORIL TAKE ONE CAPSULE BY MOUTH ONCE DAILY AT BEDTIME AS NEEDED FOR REST What changed:  See the new instructions.   triamcinolone cream 0.1 % Commonly known as:  KENALOG APPLY EXTERNALLY TO RASH TWICE DAILY AS NEEDED What changed:  See the new instructions.   VITAMIN D  PO Take 1,000 Units by mouth daily.      Allergies  Allergen Reactions  . Fioricet [Butalbital-Apap-Caffeine]     Unaware of this allergy per patient.   . Other Other (See Comments)    Profen II DM= urinary outlet obstruction Unaware of this allergy per patient.  [name] [Guaifenesin]     Unaware of this allergy per patient.   Follow-up Information    Reed, Tiffany L, DO Follow up in 1 week(s).   Specialty:  Geriatric Medicine Why:  hold ASA 1 week Contact information: [address] [place] [address] [phone]        Judith Part, MD Follow up in 2 week(s).   Specialty:  Neurosurgery Contact information: [address]  [place] [address] [phone]            The results of significant diagnostics from this hospitalization (including imaging, microbiology, ancillary and laboratory) are listed below for reference.    Significant Diagnostic Studies: Dg Ribs Unilateral W/chest Right  Result Date: 07/09/2018 CLINICAL DATA:  Right shoulder and right anterior chest pain. Unsure if fell. EXAM: RIGHT RIBS AND CHEST - 3+ VIEW COMPARISON:  Chest radiographs 10/30/2008. FINDINGS: There are lower lung volumes on the frontal chest examination with resulting mild bibasilar atelectasis. Stable cardiomegaly and aortic atherosclerosis. No pleural effusion or pneumothorax. Possible acute fracture of the right 8th rib anterolaterally. No displaced rib fractures are identified. There are mild degenerative changes throughout the spine. The subacromial space of both shoulders is obliterated, consistent with chronic rotator cuff tears. IMPRESSION: Possible nondisplaced fracture of the right 8th rib anterolaterally. No pneumothorax or pleural effusion. Electronically Signed   By: Richardean Sale M.D.   On: 07/09/2018 15:26   Dg Shoulder Right  Result Date: 07/09/2018 CLINICAL DATA:  Right shoulder pain.  Unsure of recent fall. EXAM: RIGHT SHOULDER - 2+ VIEW COMPARISON:   None. FINDINGS: The bones appear adequately mineralized. There is no evidence of acute fracture or dislocation. There are glenohumeral degenerative changes with narrowing of the subacromial space of the right shoulder, suggesting a chronic rotator cuff tear. Mild acromioclavicular degenerative changes are present as well. IMPRESSION: No acute osseous findings. Degenerative changes and secondary findings of chronic rotator cuff tear. Electronically Signed   By: Richardean Sale M.D.   On: 07/09/2018 15:23   Ct Head Wo Contrast  Result Date: 07/10/2018 CLINICAL DATA:  Continued surveillance subdural hematoma. EXAM: CT HEAD WITHOUT CONTRAST TECHNIQUE: Contiguous axial images were obtained from the base of the skull through the vertex without intravenous contrast. COMPARISON:  07/09/2018. FINDINGS: Brain: Interval slight improvement in LEFT convexity subdural hematoma, now approximately 4 mm maximum thickness frontally as compared with previous up to 7 mm thickness. Some redistribution of hemorrhage, now layering more posteriorly, still without significant mass effect on the underlying brain. No new hemorrhages. No parenchymal contusion. Minimal LEFT-to-RIGHT shift. Vascular: Calcification of the cavernous internal carotid arteries consistent with cerebrovascular atherosclerotic disease. No signs of intracranial large vessel occlusion. Skull: Calvarium intact Sinuses/Orbits: Clear sinuses. LEFT maxillary sinus fluid. Multiple BILATERAL ethmoid air cell opacity unchanged. BILATERAL ocular surgery. Other: None. IMPRESSION: Interval slight improvement LEFT convexity subdural hematoma, decreasing frontal thickness, some redistribution posteriorly. No midline shift or new hemorrhage. Continued surveillance is warranted. Electronically Signed   By: Staci Righter M.D.   On: 07/10/2018 08:59   Ct Head Wo Contrast  Result Date: 07/09/2018 CLINICAL DATA:  85 year old who fell earlier today. Patient essentially amnestic to  the event and is unsure if he lost consciousness. RIGHT LOWER orbital ecchymosis. Initial encounter. EXAM: CT HEAD WITHOUT CONTRAST CT MAXILLOFACIAL WITHOUT CONTRAST CT CERVICAL SPINE WITHOUT CONTRAST TECHNIQUE: Multidetector CT imaging of the head, cervical spine, and maxillofacial structures were performed using the standard protocol without intravenous contrast. Multiplanar CT image reconstructions of the cervical spine and maxillofacial structures were also generated. COMPARISON:  MRI brain 06/21/2016. No prior cervical spine or maxillofacial imaging. FINDINGS: CT HEAD FINDINGS Brain: Acute LEFT frontal convexity subdural hematoma with maximum thickness of approximately 7 mm. Mild mass effect with effacement of the cortical sulci, but no evidence of midline shift. No evidence of parenchymal contusion/hemorrhage. No evidence of intraventricular or subarachnoid hemorrhage. Moderate age related cortical and deep atrophy. Moderate changes of small  vessel disease of the white matter diffusely. No evidence of acute stroke. Vascular: Mild BILATERAL carotid siphon and vertebral artery atherosclerosis. No hyperdense vessel. Skull: No skull fracture or other focal osseous abnormality involving the skull. Other: None. CT MAXILLOFACIAL FINDINGS Osseous: No fractures identified involving the facial bones. Temporomandibular joints anatomically aligned with only mild degenerative changes. Orbits: No orbital fractures. No evidence of orbital hemorrhage on the RIGHT where there is preseptal soft tissue swelling/ecchymosis. Benign senile calcifications noted in both globes. No evidence of hemorrhage involving the RIGHT globe. Sinuses: Near complete opacification of the LEFT maxillary sinus as noted on the prior MRI. Opacifications of multiple BILATERAL ethmoid air cells. Remaining paranasal sinuses well aerated, with only minimal, insignificant mucosal thickening involving the RIGHT maxillary sinus. Slight bony nasal septal  deviation to the LEFT. BILATERAL mastoid air cells and BILATERAL middle ear cavities well-aerated. Soft tissues: Preseptal soft tissue swelling/ecchymosis ANTERIOR to the RIGHT eye. Ecchymosis/hematoma in the RIGHT cheek beneath the RIGHT eye. No evidence of soft tissue hematoma elsewhere. Note is made of BILATERAL cervical carotid atherosclerosis. CT CERVICAL SPINE FINDINGS Alignment: Anatomic alignment of the cervical spine. Facet joints anatomically aligned throughout with mild degenerative changes. Skull base and vertebrae: No fractures identified involving the cervical spine. Coronal reformatted images demonstrate an intact craniocervical junction, intact dens and intact lateral masses throughout. Soft tissues and spinal canal: No evidence of paraspinous or spinal canal hematoma. Mild multifactorial spinal stenosis at C5-6. Disc levels: Severe disc space narrowing and associated endplate hypertrophic changes at C4-5, C5-6, C6-7 and C7-T1. Moderate disc space narrowing at C2-3 and C3-4. Uncinate and to a lesser degree facet hypertrophy account for multilevel foraminal stenoses including mild BILATERAL C2-3, severe BILATERAL C3-4, severe RIGHT and moderate LEFT C4-5, severe LEFT and moderate RIGHT C5-6, moderate BILATERAL C6-7, and mild BILATERAL C7-T1. Upper chest: Visualized lung apices clear. Visualized superior mediastinum normal. Other: None. IMPRESSION: 1. Acute left frontal convexity subdural hematoma with maximum thickness of approximately 7 mm. No evidence of midline shift. 2. No fractures identified involving the facial bones. 3. No cervical spine fractures identified. 4. Multilevel degenerative disc disease, spondylosis and facet degenerative changes with multilevel foraminal stenoses as detailed above and mild multifactorial spinal stenosis at C5-6. 5. 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 I telephoned these critical/emergent results to Carmon Sails, PA, of the emergency  department at the time of interpretation on 07/09/2018 at 3:54 p.m. Electronically Signed   By: Evangeline Dakin M.D.   On: 07/09/2018 15:55   Ct Cervical Spine Wo Contrast  Result Date: 07/09/2018 CLINICAL DATA:  84 year old who fell earlier today. Patient essentially amnestic to the event and is unsure if he lost consciousness. RIGHT LOWER orbital ecchymosis. Initial encounter. EXAM: CT HEAD WITHOUT CONTRAST CT MAXILLOFACIAL WITHOUT CONTRAST CT CERVICAL SPINE WITHOUT CONTRAST TECHNIQUE: Multidetector CT imaging of the head, cervical spine, and maxillofacial structures were performed using the standard protocol without intravenous contrast. Multiplanar CT image reconstructions of the cervical spine and maxillofacial structures were also generated. COMPARISON:  MRI brain 06/21/2016. No prior cervical spine or maxillofacial imaging. FINDINGS: CT HEAD FINDINGS Brain: Acute LEFT frontal convexity subdural hematoma with maximum thickness of approximately 7 mm. Mild mass effect with effacement of the cortical sulci, but no evidence of midline shift. No evidence of parenchymal contusion/hemorrhage. No evidence of intraventricular or subarachnoid hemorrhage. Moderate age related cortical and deep atrophy. Moderate changes of small vessel disease of the white matter diffusely. No evidence of acute stroke. Vascular: Mild BILATERAL  carotid siphon and vertebral artery atherosclerosis. No hyperdense vessel. Skull: No skull fracture or other focal osseous abnormality involving the skull. Other: None. CT MAXILLOFACIAL FINDINGS Osseous: No fractures identified involving the facial bones. Temporomandibular joints anatomically aligned with only mild degenerative changes. Orbits: No orbital fractures. No evidence of orbital hemorrhage on the RIGHT where there is preseptal soft tissue swelling/ecchymosis. Benign senile calcifications noted in both globes. No evidence of hemorrhage involving the RIGHT globe. Sinuses: Near complete  opacification of the LEFT maxillary sinus as noted on the prior MRI. Opacifications of multiple BILATERAL ethmoid air cells. Remaining paranasal sinuses well aerated, with only minimal, insignificant mucosal thickening involving the RIGHT maxillary sinus. Slight bony nasal septal deviation to the LEFT. BILATERAL mastoid air cells and BILATERAL middle ear cavities well-aerated. Soft tissues: Preseptal soft tissue swelling/ecchymosis ANTERIOR to the RIGHT eye. Ecchymosis/hematoma in the RIGHT cheek beneath the RIGHT eye. No evidence of soft tissue hematoma elsewhere. Note is made of BILATERAL cervical carotid atherosclerosis. CT CERVICAL SPINE FINDINGS Alignment: Anatomic alignment of the cervical spine. Facet joints anatomically aligned throughout with mild degenerative changes. Skull base and vertebrae: No fractures identified involving the cervical spine. Coronal reformatted images demonstrate an intact craniocervical junction, intact dens and intact lateral masses throughout. Soft tissues and spinal canal: No evidence of paraspinous or spinal canal hematoma. Mild multifactorial spinal stenosis at C5-6. Disc levels: Severe disc space narrowing and associated endplate hypertrophic changes at C4-5, C5-6, C6-7 and C7-T1. Moderate disc space narrowing at C2-3 and C3-4. Uncinate and to a lesser degree facet hypertrophy account for multilevel foraminal stenoses including mild BILATERAL C2-3, severe BILATERAL C3-4, severe RIGHT and moderate LEFT C4-5, severe LEFT and moderate RIGHT C5-6, moderate BILATERAL C6-7, and mild BILATERAL C7-T1. Upper chest: Visualized lung apices clear. Visualized superior mediastinum normal. Other: None. IMPRESSION: 1. Acute left frontal convexity subdural hematoma with maximum thickness of approximately 7 mm. No evidence of midline shift. 2. No fractures identified involving the facial bones. 3. No cervical spine fractures identified. 4. Multilevel degenerative disc disease, spondylosis and  facet degenerative changes with multilevel foraminal stenoses as detailed above and mild multifactorial spinal stenosis at C5-6. 5. 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 I telephoned these critical/emergent results to Carmon Sails, PA, of the emergency department at the time of interpretation on 07/09/2018 at 3:54 p.m. Electronically Signed   By: Evangeline Dakin M.D.   On: 07/09/2018 15:55   Dg Humerus Right  Result Date: 07/09/2018 CLINICAL DATA:  Upper arm pain with loss of consciousness. Unsure if fell. EXAM: RIGHT HUMERUS - 2+ VIEW COMPARISON:  None. FINDINGS: The bones appear adequately mineralized. No evidence of acute fracture or dislocation. The alignment is normal at the elbow. The subacromial space is obliterated at the shoulder, consistent with a chronic rotator cuff tear. IMPRESSION: No acute osseous findings. Secondary signs of chronic rotator cuff tear. Electronically Signed   By: Richardean Sale M.D.   On: 07/09/2018 15:24   Ct Maxillofacial Wo Contrast  Result Date: 07/09/2018 CLINICAL DATA:  85 year old who fell earlier today. Patient essentially amnestic to the event and is unsure if he lost consciousness. RIGHT LOWER orbital ecchymosis. Initial encounter. EXAM: CT HEAD WITHOUT CONTRAST CT MAXILLOFACIAL WITHOUT CONTRAST CT CERVICAL SPINE WITHOUT CONTRAST TECHNIQUE: Multidetector CT imaging of the head, cervical spine, and maxillofacial structures were performed using the standard protocol without intravenous contrast. Multiplanar CT image reconstructions of the cervical spine and maxillofacial structures were also generated. COMPARISON:  MRI brain 06/21/2016. No prior  cervical spine or maxillofacial imaging. FINDINGS: CT HEAD FINDINGS Brain: Acute LEFT frontal convexity subdural hematoma with maximum thickness of approximately 7 mm. Mild mass effect with effacement of the cortical sulci, but no evidence of midline shift. No evidence of parenchymal  contusion/hemorrhage. No evidence of intraventricular or subarachnoid hemorrhage. Moderate age related cortical and deep atrophy. Moderate changes of small vessel disease of the white matter diffusely. No evidence of acute stroke. Vascular: Mild BILATERAL carotid siphon and vertebral artery atherosclerosis. No hyperdense vessel. Skull: No skull fracture or other focal osseous abnormality involving the skull. Other: None. CT MAXILLOFACIAL FINDINGS Osseous: No fractures identified involving the facial bones. Temporomandibular joints anatomically aligned with only mild degenerative changes. Orbits: No orbital fractures. No evidence of orbital hemorrhage on the RIGHT where there is preseptal soft tissue swelling/ecchymosis. Benign senile calcifications noted in both globes. No evidence of hemorrhage involving the RIGHT globe. Sinuses: Near complete opacification of the LEFT maxillary sinus as noted on the prior MRI. Opacifications of multiple BILATERAL ethmoid air cells. Remaining paranasal sinuses well aerated, with only minimal, insignificant mucosal thickening involving the RIGHT maxillary sinus. Slight bony nasal septal deviation to the LEFT. BILATERAL mastoid air cells and BILATERAL middle ear cavities well-aerated. Soft tissues: Preseptal soft tissue swelling/ecchymosis ANTERIOR to the RIGHT eye. Ecchymosis/hematoma in the RIGHT cheek beneath the RIGHT eye. No evidence of soft tissue hematoma elsewhere. Note is made of BILATERAL cervical carotid atherosclerosis. CT CERVICAL SPINE FINDINGS Alignment: Anatomic alignment of the cervical spine. Facet joints anatomically aligned throughout with mild degenerative changes. Skull base and vertebrae: No fractures identified involving the cervical spine. Coronal reformatted images demonstrate an intact craniocervical junction, intact dens and intact lateral masses throughout. Soft tissues and spinal canal: No evidence of paraspinous or spinal canal hematoma. Mild  multifactorial spinal stenosis at C5-6. Disc levels: Severe disc space narrowing and associated endplate hypertrophic changes at C4-5, C5-6, C6-7 and C7-T1. Moderate disc space narrowing at C2-3 and C3-4. Uncinate and to a lesser degree facet hypertrophy account for multilevel foraminal stenoses including mild BILATERAL C2-3, severe BILATERAL C3-4, severe RIGHT and moderate LEFT C4-5, severe LEFT and moderate RIGHT C5-6, moderate BILATERAL C6-7, and mild BILATERAL C7-T1. Upper chest: Visualized lung apices clear. Visualized superior mediastinum normal. Other: None. IMPRESSION: 1. Acute left frontal convexity subdural hematoma with maximum thickness of approximately 7 mm. No evidence of midline shift. 2. No fractures identified involving the facial bones. 3. No cervical spine fractures identified. 4. Multilevel degenerative disc disease, spondylosis and facet degenerative changes with multilevel foraminal stenoses as detailed above and mild multifactorial spinal stenosis at C5-6. 5. 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 I telephoned these critical/emergent results to Carmon Sails, PA, of the emergency department at the time of interpretation on 07/09/2018 at 3:54 p.m. Electronically Signed   By: Evangeline Dakin M.D.   On: 07/09/2018 15:55    Microbiology: No results found for this or any previous visit (from the past 240 hour(s)).   Labs: Basic Metabolic Panel: Recent Labs  Lab 07/09/18 1445  NA 138  K 4.0  CL 108  CO2 21*  GLUCOSE 90  BUN 22  CREATININE 1.08  CALCIUM 9.0   Liver Function Tests: No results for input(s): AST, ALT, ALKPHOS, BILITOT, PROT, ALBUMIN in the last 168 hours. No results for input(s): LIPASE, AMYLASE in the last 168 hours. No results for input(s): AMMONIA in the last 168 hours. CBC: Recent Labs  Lab 07/09/18 1445  WBC 7.3  NEUTROABS 4.5  HGB 14.4  HCT 42.2  MCV 90.8  PLT 143*   Cardiac Enzymes: No results for input(s): CKTOTAL,  CKMB, CKMBINDEX, TROPONINI in the last 168 hours. BNP: BNP (last 3 results) No results for input(s): BNP in the last 8760 hours.  ProBNP (last 3 results) No results for input(s): PROBNP in the last 8760 hours.  CBG: Recent Labs  Lab 07/10/18 1140  GLUCAP 143*       Signed:  Dyanne Carrel NP  [facility] 07/10/2018, 1:57 PM

## 2018-07-10 NOTE — Consult Note (Addendum)
Neurosurgery Consultation  Reason for Consult: Subdural hematoma Referring Physician: Eliseo Squires  CC: Headache  HPI: This is a 85 y.o. man s/p fall that came to the ED due to headache. No new weakness, numbness, or parasthesias, no recent change in bowel or bladder function. He fell on his left shoulder so he is pain limited in that joint, but no other complaints, headache is currently minimal. +ASA81, otherwise no recent use of anti-platelet or anti-coagulant medications.   ROS: A 14 point ROS was performed and is negative except as noted in the HPI.   PMHx:  Past Medical History:  Diagnosis Date  . Bladder neck obstruction 03/28/2008  . Contact with or exposure to tuberculosis 03/15/1992  . Elevated prostate specific antigen (PSA) 03/15/1998  . Gastric ulcer, unspecified as acute or chronic, without mention of hemorrhage, perforation, or obstruction 03/15/1993  . Hypertension 07/08/1998  . Hypertrophy of prostate without urinary obstruction and other lower urinary tract symptoms (LUTS) 03/15/1989  . 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 Inguinal hernia without mention of obstruction or gangrene, unilateral or unspecified, (not specified as recurrent) 11/20/2008  . Insomnia, unspecified 11/01/2003  . Kidney calculi 08/20/2009  . Other and unspecified hyperlipidemia 12/23/2004  . Other malaise and fatigue 12/23/2004  . Pain in joint, pelvic region and thigh 12/17/2009  . Pain in joint, shoulder region 11/01/2003  . Peptic ulcer, unspecified site, unspecified as acute or chronic, without mention of hemorrhage, perforation, or obstruction 06/25/2003  . Personal history of malaria 03/16/1939  . Rosacea 08/20/2009  . Type II or unspecified type diabetes mellitus without mention of complication, not stated as uncontrolled 07/15/2006  . Unspecified constipation 08/05/2010  . Unspecified hypothyroidism 12/17/2009   FamHx:  Family History  Problem Relation Age of Onset  . Heart disease  Father    SocHx:  reports that he quit smoking about 39 years ago. He has never used smokeless tobacco. He reports current alcohol use. He reports that he does not use drugs.  Exam: Vital signs in last 24 hours: Temp:  [97.3 F (36.3 C)-98.4 F (36.9 C)] 97.8 F (36.6 C) (04/27 0804) Pulse Rate:  [53-75] 55 (04/27 0804) Resp:  [12-23] 18 (04/27 0804) BP: (129-174)/(67-119) 149/72 (04/27 0804) SpO2:  [94 %-100 %] 97 % (04/27 0804) General: Awake, alert, cooperative, lying in bed in NAD Head: normocephalic, +R supraoribtal laceration with surrounding ecchymosis HEENT: neck supple Pulmonary: breathing room air comfortably, no evidence of increased work of breathing Cardiac: RRR Abdomen: S NT ND Extremities: warm and well perfused x4, tender at right [facility] joint Neuro: AOx3, PERRL, EOMI, FS Strength 5/5 x4 except pain limited in RUE proximally, SILTx4, no drift on left   Assessment and Plan: 85 y.o. man s/p fall with headache. Hazel personally reviewed, which shows left 72mm acute subdural hematoma.   -no acute neurosurgical intervention indicated at this time -pt is at neurologic baseline with improved repeat CTH, okay with discharge home later today from a neurosurgical perspective -ASA81 appears to be primary preventative, if so then hold for 1 week. -will need to follow up with me in 2 weeks in clinic, I placed my follow up information in his discharge instructions. Please call with any concerns or questions -discussed the above with the patient and updated his son via telephone as well  Judith Part, MD 07/10/18 10:41 AM [facility]

## 2018-07-10 NOTE — Plan of Care (Signed)
Adequate for discharge.

## 2018-07-10 NOTE — Evaluation (Signed)
Physical Therapy Evaluation Patient Details Name: Bobby Jensen MRN: [id number] DOB: 04-Jun-1927 Today's Date: 07/10/2018   History of Present Illness  Patient is an 85 y/o male presenting to the ED on 07/09/2018 with primary complaints of fall striking his head. Chest x-ray revealed nondisplaced fracture of the right eighth rib. CT of the head was performed revealing a small subdural hematoma left. Past medical history significant of hypertension, hypothyroid disease, type 2 diabetes well-controlled, lipidemia.     Clinical Impression  Bobby Jensen is a very pleasant 85 y/o male presenting with the above listed diagnosis. Patient reports Mod I with mobility at baseline with wife able to assist as needed. Patient today ambulating in hallway with no assistive device, and up/down 4 steps with handrail without LOB - min guard for general safety. PT to follow acutely to maximize safe and independent functional mobility. Do not anticipate need for PT at discharge.     Follow Up Recommendations No PT follow up;Supervision - Intermittent    Equipment Recommendations  None recommended by PT    Recommendations for Other Services       Precautions / Restrictions Precautions Precautions: Fall Restrictions Weight Bearing Restrictions: No      Mobility  Bed Mobility Overal bed mobility: Needs Assistance Bed Mobility: Supine to Sit     Supine to sit: Supervision     General bed mobility comments: increased time and effort  Transfers Overall transfer level: Needs assistance Equipment used: None Transfers: Sit to/from Stand Sit to Stand: Min guard         General transfer comment: for safety; mild instability  Ambulation/Gait Ambulation/Gait assistance: Min guard Gait Distance (Feet): 200 Feet Assistive device: None Gait Pattern/deviations: Step-through pattern;Decreased stride length Gait velocity: decreased   General Gait Details: mild instability - relates this to his  prior THA and being stiff from bed   Stairs Stairs: Yes Stairs assistance: Min guard Stair Management: One rail Right;Step to pattern;Forwards Number of Stairs: 2(x2) General stair comments: reports this is his baseline stair navigation technique  Wheelchair Mobility    Modified Rankin (Stroke Patients Only)       Balance Overall balance assessment: Mild deficits observed, not formally tested                                           Pertinent Vitals/Pain      Home Living Family/patient expects to be discharged to:: Private residence Living Arrangements: Spouse/significant other Available Help at Discharge: Family;Available 24 hours/day Type of Home: House Home Access: Level entry     Home Layout: Two level Home Equipment: None      Prior Function Level of Independence: Independent               Hand Dominance        Extremity/Trunk Assessment   Upper Extremity Assessment Upper Extremity Assessment: Defer to OT evaluation    Lower Extremity Assessment Lower Extremity Assessment: Generalized weakness    Cervical / Trunk Assessment Cervical / Trunk Assessment: Kyphotic  Communication   Communication: No difficulties  Cognition Arousal/Alertness: Awake/alert Behavior During Therapy: WFL for tasks assessed/performed Overall Cognitive Status: Within Functional Limits for tasks assessed  General Comments General comments (skin integrity, edema, etc.): bruised R eye    Exercises     Assessment/Plan    PT Assessment Patient needs continued PT services  PT Problem List Decreased strength;Decreased activity tolerance;Decreased balance;Decreased mobility;Decreased safety awareness       PT Treatment Interventions DME instruction;Gait training;Stair training;Functional mobility training;Therapeutic activities;Therapeutic exercise;Balance training;Patient/family education     PT Goals (Current goals can be found in the Care Plan section)  Acute Rehab PT Goals Patient Stated Goal: return home today PT Goal Formulation: With patient Time For Goal Achievement: 07/17/18 Potential to Achieve Goals: Good    Frequency Min 3X/week   Barriers to discharge        Co-evaluation               AM-PAC PT "6 Clicks" Mobility  Outcome Measure Help needed turning from your back to your side while in a flat bed without using bedrails?: A Little Help needed moving from lying on your back to sitting on the side of a flat bed without using bedrails?: A Little Help needed moving to and from a bed to a chair (including a wheelchair)?: A Little Help needed standing up from a chair using your arms (e.g., wheelchair or bedside chair)?: A Little Help needed to walk in hospital room?: A Little Help needed climbing 3-5 steps with a railing? : A Little 6 Click Score: 18    End of Session Equipment Utilized During Treatment: Gait belt Activity Tolerance: Patient tolerated treatment well Patient left: in bed;with call bell/phone within reach Nurse Communication: Mobility status PT Visit Diagnosis: Unsteadiness on feet (R26.81);History of falling (Z91.81)    Time: 1173-5670 PT Time Calculation (min) (ACUTE ONLY): 15 min   Charges:   PT Evaluation $PT Eval Moderate Complexity: 1 Mod           Lanney Gins, PT, DPT Supplemental Physical Therapist 07/10/18 11:17 AM Pager: [phone] Office: [phone]

## 2018-07-10 NOTE — Progress Notes (Signed)
Patient to CT via wheelchair.

## 2018-07-10 NOTE — TOC Initial Note (Signed)
Transition of Care [facility]) - Initial/Assessment Note    Patient Details  Name: Bobby Jensen MRN: [id number] Date of Birth: 05-05-1927  Transition of [facility]) CM/SW Contact:    Pollie Friar, RN Phone Number: 07/10/2018, 12:35 PM  Clinical Narrative:                   Expected Discharge Plan: Home/Self Care Barriers to Discharge: Continued Medical Work up   Patient Goals and CMS Choice Patient states their goals for this hospitalization and ongoing recovery are:: not to fall again       Expected Discharge Plan and Services Expected Discharge Plan: Home/Self Care       Living arrangements for the past 2 months: Single Family Home(split level with 3 levels) Expected Discharge Date: 07/10/18                                    Prior Living Arrangements/Services Living arrangements for the past 2 months: Single Family Home(split level with 3 levels) Lives with:: Spouse Patient language and need for interpreter reviewed:: Yes(no needs) Do you feel safe going back to the place where you live?: Yes      Need for Family Participation in Patient Care: (intermittent supervision) Care giver support system in place?: Yes (comment)(wife able to provide supervision and asssistance) Current home services: DME(cane and walker but doesnt use) Criminal Activity/Legal Involvement Pertinent to Current Situation/Hospitalization: No - Comment as needed  Activities of Daily Living      Permission Sought/Granted                  Emotional Assessment Appearance:: Appears stated age Attitude/Demeanor/Rapport: Engaged Affect (typically observed): Accepting, Appropriate, Pleasant Orientation: : Oriented to Self, Oriented to Place, Oriented to  Time, Oriented to Situation   Psych Involvement: No (comment)  Admission diagnosis:  SDH (subdural hematoma) ([place]) [S06.5X9A] Fall, initial encounter [W19.XXXA] Closed fracture of one rib of right side, initial encounter  [S22.31XA] Patient Active Problem List   Diagnosis Date Noted  . Right rotator cuff tear 07/10/2018  . Right shoulder injury 07/10/2018  . SDH (subdural hematoma) ([place]) 07/09/2018  . Fall   . Closed fracture of one rib of right side   . Primary osteoarthritis of right hip 03/17/2017  . Bell's palsy 05/06/2015  . Dizziness 11/01/2014  . Bursitis of right hip 05/23/2014  . Abnormal x-ray 05/23/2014  . Bradycardia 07/11/2013  . Anxiety state 07/11/2013  . Hypothyroidism 12/17/2009  . Type II diabetes mellitus with stage 3 chronic kidney disease ([place]) 07/15/2006  . Hyperlipidemia 12/23/2004  . Insomnia 11/01/2003  . Hypertension 07/08/1998  . 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 Personal history of malaria 03/16/1939   PCP:  Gayland Curry, DO Pharmacy:   [facility] [place], [place] - [address] [place] [address] [place] [address] Phone: [phone] Fax: [phone]  [place], [place] - [address] AT [facility] [address] [place] [address] Phone: [phone] Fax: [phone]  [facility] [place], [place] - [address] [place] [address] Phone: [phone] Fax: [phone]     Social Determinants of Health (SDOH) Interventions    Readmission Risk Interventions No flowsheet data found.

## 2018-07-13 ENCOUNTER — Encounter: Payer: Self-pay | Admitting: Internal Medicine

## 2018-07-13 ENCOUNTER — Ambulatory Visit (INDEPENDENT_AMBULATORY_CARE_PROVIDER_SITE_OTHER): Payer: Medicare Other | Admitting: Internal Medicine

## 2018-07-13 ENCOUNTER — Other Ambulatory Visit: Payer: Self-pay

## 2018-07-13 DIAGNOSIS — S065XAA Traumatic subdural hemorrhage with loss of consciousness status unknown, initial encounter: Secondary | ICD-10-CM

## 2018-07-13 DIAGNOSIS — E038 Other specified hypothyroidism: Secondary | ICD-10-CM

## 2018-07-13 DIAGNOSIS — S065X0A Traumatic subdural hemorrhage without loss of consciousness, initial encounter: Secondary | ICD-10-CM | POA: Diagnosis not present

## 2018-07-13 DIAGNOSIS — S4991XS Unspecified injury of right shoulder and upper arm, sequela: Secondary | ICD-10-CM | POA: Diagnosis not present

## 2018-07-13 DIAGNOSIS — I10 Essential (primary) hypertension: Secondary | ICD-10-CM | POA: Diagnosis not present

## 2018-07-13 DIAGNOSIS — E785 Hyperlipidemia, unspecified: Secondary | ICD-10-CM

## 2018-07-13 DIAGNOSIS — W19XXXS Unspecified fall, sequela: Secondary | ICD-10-CM

## 2018-07-13 DIAGNOSIS — S2231XS Fracture of one rib, right side, sequela: Secondary | ICD-10-CM

## 2018-07-13 DIAGNOSIS — S065X9A Traumatic subdural hemorrhage with loss of consciousness of unspecified duration, initial encounter: Secondary | ICD-10-CM | POA: Diagnosis not present

## 2018-07-13 DIAGNOSIS — N183 Chronic kidney disease, stage 3 unspecified: Secondary | ICD-10-CM

## 2018-07-13 DIAGNOSIS — F411 Generalized anxiety disorder: Secondary | ICD-10-CM

## 2018-07-13 DIAGNOSIS — E1122 Type 2 diabetes mellitus with diabetic chronic kidney disease: Secondary | ICD-10-CM | POA: Diagnosis not present

## 2018-07-13 NOTE — ACP (Advance Care Planning) (Signed)
We reviewed who would make decisions for him and importance of putting this on paper in a HCPOA--he reports he will do this, but has put it off or forgotten before.  He says his daughter and son-in-law would be best b/c his wife would panic.  She was in the background of the phone call commenting about how she could not make a decision to withhold his care. Also reviewed DNR vs full code--he does request DNR status if he had a cardiopulmonary arrest--we will be sure to do this form at his next visit and likely will do a MOST form, as well (no way to do e-signature for him at this point so did defer).  More details in hpi of TOC note from today.  20 minutes spent on ACP.

## 2018-07-13 NOTE — Patient Instructions (Addendum)
Please check your blood pressure three times a week about an hour after your losartan medication and record the results--we will review these at your visit in about 6 weeks.  Let me know sooner if your readings are consistently over 462 systolic.  Be sure to get that board in your deck repaired so there are no more falls.  Please get your living will and health care power of attorney documents completed in case there is a point where you are unable to communicate your wishes on your own.  I can complete your DNR form at your next visit.  We can also discuss the Medical Orders for Scope of Treatment (MOST) form at that time.    Keep up the good work with your shoulder and breathing exercises.    Stay safe!

## 2018-07-13 NOTE — Progress Notes (Signed)
Patient ID: Bobby Jensen, male   DOB: [birth year], 85 y.o.   MRN: [id number] This service is provided via telemedicine  No vital signs collected/recorded due to the encounter was a telemedicine visit.   Location of patient (ex: home, work):  HOME  Patient consents to a telephone visit:  YES  Location of the provider (ex: office, home):  OFFICE  Name of any referring provider:  DR [facility] Sandhya Denherder, DO  Names of all persons participating in the telemedicine service and their role in the encounter:  PATIENT, [place], DR Jonelle Sidle Aalijah Mims DO  Time spent on call:  7:22    Provider: Karly Pitter L. Mariea Clonts, D.O., C.M.D.  Code Status: DNR Goals of Care:  Advanced Directives 05/01/2018  Does Patient Have a Medical Advance Directive? No  Type of Advance Directive -  Copy of [place] in Chart? -  Would patient like information on creating a medical advance directive? No - Patient declined     Chief Complaint  Patient presents with  . Transitions Of Care    Discharged 07/10/2018 (telephone visit)    HPI: Patient is a 85 y.o. male spoken with today by phone for hospital follow-up s/p admission from   He was sitting on the deck doing a socially distant visit on his deck.  There was a loose board when he went to stand up.  His slipper caught on the rise of the board and then he fell.  He does not remember the fall.  Fell face first.  Has a big shiner, bloody eye, head, shoulder, ribs.  He was alert and it just happened so fast. He did not pass out.    He has a f/u with Dr. Zada Finders after his subdural hematoma in 2 weeks.  CT scan was stable so no surgery was necessary.    We had stopped the hctz with his blood pressure pill due to his labs.  BP was 146-153 at the hospital and he's not sure he'd received his medications there.    CBG this am was 92.    He is now able to raise his right shoulder and swing the arm like he's playing golf.  He has some exercises Dr.  Tommie Raymond that did his hip replacement had suggested for the shoulder.  He's doing those again now.    He is doing the breathing and coughing for his two cracked ribs.  They did not give him the incentive spirometer.  The ribs only hurt a little when he moves.    He had the core of his prostate removed by Dr. Terance Hart and he has not had any problem urinating since then.  Has BP machine at home and trying to take 3 times a week and write them down.  100/25 losartan/hctz.  He does try to drink water--has about 2 16oz bottles of water per day.    He notes his daughter and son-in-law would need to be involved.  His wife would not be able to make a decision to "pull the plug".  His son is farther away in [place].  He hopes he goes without having to hospitalized.  He just wants to go out quickly.  He does not want to suffer.  He would not be want to be resuscitated if his heart stopped or he stopped breathing.  He'd love to make it to 89 if he's in good health.  If he's bed ridden or suffering, he does not want additional life.  Past Medical History:  Diagnosis Date  . Bladder neck obstruction 03/28/2008  . Closed rib fracture   . Contact with or exposure to tuberculosis 03/15/1992  . Elevated prostate specific antigen (PSA) 03/15/1998  . Gastric ulcer, unspecified as acute or chronic, without mention of hemorrhage, perforation, or obstruction 03/15/1993  . Hypertension 07/08/1998  . Hypertrophy of prostate without urinary obstruction and other lower urinary tract symptoms (LUTS) 03/15/1989  . 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 Inguinal hernia without mention of obstruction or gangrene, unilateral or unspecified, (not specified as recurrent) 11/20/2008  . Insomnia, unspecified 11/01/2003  . Kidney calculi 08/20/2009  . Other and unspecified hyperlipidemia 12/23/2004  . Other malaise and fatigue 12/23/2004  . Pain in joint, pelvic region and thigh 12/17/2009  . Pain in joint,  shoulder region 11/01/2003  . Peptic ulcer, unspecified site, unspecified as acute or chronic, without mention of hemorrhage, perforation, or obstruction 06/25/2003  . Personal history of malaria 03/16/1939  . Right rotator cuff tear 07/10/2018   Chronic per imaging 4/20  . Right shoulder injury 07/10/2018  . Rosacea 08/20/2009  . SDH (subdural hematoma) ([place])   . Type II or unspecified type diabetes mellitus without mention of complication, not stated as uncontrolled 07/15/2006  . Unspecified constipation 08/05/2010  . Unspecified hypothyroidism 12/17/2009    Past Surgical History:  Procedure Laterality Date  . APPENDECTOMY  1956  . HEMORROIDECTOMY  06/2002   Dr. Lennie Hummer  . HERNIA REPAIR Bilateral 11/2008   inguinal  . PILONIDAL CYST EXCISION  1963  . PROSTATE SURGERY    . TOTAL HIP ARTHROPLASTY Right 07/10/2009   Dr. Telford Nab    Allergies  Allergen Reactions  . Fioricet [Butalbital-Apap-Caffeine]     Unaware of this allergy per patient.   . Other Other (See Comments)    Profen II DM= urinary outlet obstruction Unaware of this allergy per patient.  [name] [Guaifenesin]     Unaware of this allergy per patient.    Outpatient Encounter Medications as of 07/13/2018  Medication Sig  . acetaminophen (TYLENOL) 325 MG tablet Take 2 tablets (650 mg total) by mouth every 6 (six) hours as needed for mild pain (or Fever >/= 101).  Marland Kitchen ALPRAZolam (XANAX) 0.25 MG tablet Take 0.25 mg by mouth daily as needed for anxiety.  Derrill Memo ON 07/24/2018] aspirin 81 MG tablet Take 1 tablet (81 mg total) by mouth daily.  . Cholecalciferol (VITAMIN D PO) Take 1,000 Units by mouth daily.   Marland Kitchen glimepiride (AMARYL) 1 MG tablet TAKE 1 TABLET BY MOUTH EVERY DAY  . glucose blood (ONE TOUCH ULTRA TEST) test strip USE TO CHECK BLOOD SUGAR ONCE DAILY AND AS DIRECTED  . levothyroxine (SYNTHROID) 50 MCG tablet TAKE 1 TABLET BY MOUTH EVERY DAY FOR THYROID  . losartan (COZAAR) 100 MG tablet Take 1 tablet (100 mg  total) by mouth daily.  . nitroGLYCERIN (NITROSTAT) 0.4 MG SL tablet Place 1 tablet (0.4 mg total) under the tongue every 5 (five) minutes as needed for chest pain.  Glory Rosebush DELICA LANCETS 93Y MISC Check blood sugar once daily as directed E11.22  . simvastatin (ZOCOR) 20 MG tablet TAKE 1 TABLET BY MOUTH EVERY NIGHT AT BEDTIME  . temazepam (RESTORIL) 15 MG capsule TAKE ONE CAPSULE BY MOUTH ONCE DAILY AT BEDTIME AS NEEDED FOR REST  . triamcinolone cream (KENALOG) 0.1 % APPLY EXTERNALLY TO RASH TWICE DAILY AS NEEDED  . [DISCONTINUED] ALPRAZolam (XANAX) 0.25 MG tablet TAKE 1 TABLET BY MOUTH THREE  TIMES DAILY   No facility-administered encounter medications on file as of 07/13/2018.     Review of Systems:  Review of Systems  Constitutional: Negative for chills, fever and malaise/fatigue.  HENT: Positive for hearing loss.        Bell's palsy; right eye bruising  Eyes: Negative for blurred vision.  Respiratory: Negative for cough and shortness of breath.   Cardiovascular: Negative for chest pain, palpitations and leg swelling.  Gastrointestinal: Negative for abdominal pain, blood in stool, constipation, diarrhea, melena, nausea and vomiting.  Genitourinary: Negative for dysuria, frequency and urgency.  Musculoskeletal: Positive for falls. Negative for joint pain.       Ribs tender on right  Skin: Negative for itching and rash.  Neurological: Negative for dizziness, loss of consciousness and headaches.       Chronic bell's palsy  Endo/Heme/Allergies: Bruises/bleeds easily.  Psychiatric/Behavioral: Negative for depression and memory loss. The patient is nervous/anxious. The patient does not have insomnia.     Health Maintenance  Topic Date Due  . PNA vac Low Risk Adult (2 of 2 - PPSV23) 03/23/2017  . INFLUENZA VACCINE  10/14/2018  . HEMOGLOBIN A1C  10/23/2018  . FOOT EXAM  10/28/2018  . OPHTHALMOLOGY EXAM  01/14/2019  . TETANUS/TDAP  03/06/2023    Physical Exam: There were no vitals  filed for this visit. There is no height or weight on file to calculate BMI. Physical Exam Could not be performed as visit done via telephone due to covid-19 social distancing  Labs reviewed: Basic Metabolic Panel: Recent Labs    09/19/17 0840 04/24/18 0802 07/09/18 1445  NA 140 141 138  K 4.3 4.2 4.0  CL 106 106 108  CO2 25 26 21*  GLUCOSE 88 84 90  BUN 26* 26* 22  CREATININE 1.18* 1.28* 1.08  CALCIUM 9.3 9.2 9.0  TSH 3.07  --   --    Liver Function Tests: Recent Labs    09/19/17 0840  AST 15  ALT 12  BILITOT 0.7  PROT 6.7   No results for input(s): LIPASE, AMYLASE in the last 8760 hours. No results for input(s): AMMONIA in the last 8760 hours. CBC: Recent Labs    09/19/17 0840 04/24/18 0802 07/09/18 1445  WBC 6.6 6.1 7.3  NEUTROABS 3,808 3,654 4.5  HGB 14.8 15.1 14.4  HCT 43.7 44.2 42.2  MCV 89.4 89.7 90.8  PLT 165 188 143*   Lipid Panel: Recent Labs    09/19/17 0840  CHOL 147  HDL 51  LDLCALC 80  TRIG 77  CHOLHDL 2.9   Lab Results  Component Value Date   HGBA1C 6.0 (H) 04/24/2018    Procedures since last visit: Dg Ribs Unilateral W/chest Right  Result Date: 07/09/2018 CLINICAL DATA:  Right shoulder and right anterior chest pain. Unsure if fell. EXAM: RIGHT RIBS AND CHEST - 3+ VIEW COMPARISON:  Chest radiographs 10/30/2008. FINDINGS: There are lower lung volumes on the frontal chest examination with resulting mild bibasilar atelectasis. Stable cardiomegaly and aortic atherosclerosis. No pleural effusion or pneumothorax. Possible acute fracture of the right 8th rib anterolaterally. No displaced rib fractures are identified. There are mild degenerative changes throughout the spine. The subacromial space of both shoulders is obliterated, consistent with chronic rotator cuff tears. IMPRESSION: Possible nondisplaced fracture of the right 8th rib anterolaterally. No pneumothorax or pleural effusion. Electronically Signed   By: Richardean Sale M.D.   On:  07/09/2018 15:26   Dg Shoulder Right  Result Date: 07/09/2018 CLINICAL DATA:  Right shoulder pain.  Unsure of recent fall. EXAM: RIGHT SHOULDER - 2+ VIEW COMPARISON:  None. FINDINGS: The bones appear adequately mineralized. There is no evidence of acute fracture or dislocation. There are glenohumeral degenerative changes with narrowing of the subacromial space of the right shoulder, suggesting a chronic rotator cuff tear. Mild acromioclavicular degenerative changes are present as well. IMPRESSION: No acute osseous findings. Degenerative changes and secondary findings of chronic rotator cuff tear. Electronically Signed   By: Richardean Sale M.D.   On: 07/09/2018 15:23   Ct Head Wo Contrast  Result Date: 07/10/2018 CLINICAL DATA:  Continued surveillance subdural hematoma. EXAM: CT HEAD WITHOUT CONTRAST TECHNIQUE: Contiguous axial images were obtained from the base of the skull through the vertex without intravenous contrast. COMPARISON:  07/09/2018. FINDINGS: Brain: Interval slight improvement in LEFT convexity subdural hematoma, now approximately 4 mm maximum thickness frontally as compared with previous up to 7 mm thickness. Some redistribution of hemorrhage, now layering more posteriorly, still without significant mass effect on the underlying brain. No new hemorrhages. No parenchymal contusion. Minimal LEFT-to-RIGHT shift. Vascular: Calcification of the cavernous internal carotid arteries consistent with cerebrovascular atherosclerotic disease. No signs of intracranial large vessel occlusion. Skull: Calvarium intact Sinuses/Orbits: Clear sinuses. LEFT maxillary sinus fluid. Multiple BILATERAL ethmoid air cell opacity unchanged. BILATERAL ocular surgery. Other: None. IMPRESSION: Interval slight improvement LEFT convexity subdural hematoma, decreasing frontal thickness, some redistribution posteriorly. No midline shift or new hemorrhage. Continued surveillance is warranted. Electronically Signed   By: Staci Righter M.D.   On: 07/10/2018 08:59   Ct Head Wo Contrast  Result Date: 07/09/2018 CLINICAL DATA:  85 year old who fell earlier today. Patient essentially amnestic to the event and is unsure if he lost consciousness. RIGHT LOWER orbital ecchymosis. Initial encounter. EXAM: CT HEAD WITHOUT CONTRAST CT MAXILLOFACIAL WITHOUT CONTRAST CT CERVICAL SPINE WITHOUT CONTRAST TECHNIQUE: Multidetector CT imaging of the head, cervical spine, and maxillofacial structures were performed using the standard protocol without intravenous contrast. Multiplanar CT image reconstructions of the cervical spine and maxillofacial structures were also generated. COMPARISON:  MRI brain 06/21/2016. No prior cervical spine or maxillofacial imaging. FINDINGS: CT HEAD FINDINGS Brain: Acute LEFT frontal convexity subdural hematoma with maximum thickness of approximately 7 mm. Mild mass effect with effacement of the cortical sulci, but no evidence of midline shift. No evidence of parenchymal contusion/hemorrhage. No evidence of intraventricular or subarachnoid hemorrhage. Moderate age related cortical and deep atrophy. Moderate changes of small vessel disease of the white matter diffusely. No evidence of acute stroke. Vascular: Mild BILATERAL carotid siphon and vertebral artery atherosclerosis. No hyperdense vessel. Skull: No skull fracture or other focal osseous abnormality involving the skull. Other: None. CT MAXILLOFACIAL FINDINGS Osseous: No fractures identified involving the facial bones. Temporomandibular joints anatomically aligned with only mild degenerative changes. Orbits: No orbital fractures. No evidence of orbital hemorrhage on the RIGHT where there is preseptal soft tissue swelling/ecchymosis. Benign senile calcifications noted in both globes. No evidence of hemorrhage involving the RIGHT globe. Sinuses: Near complete opacification of the LEFT maxillary sinus as noted on the prior MRI. Opacifications of multiple BILATERAL ethmoid  air cells. Remaining paranasal sinuses well aerated, with only minimal, insignificant mucosal thickening involving the RIGHT maxillary sinus. Slight bony nasal septal deviation to the LEFT. BILATERAL mastoid air cells and BILATERAL middle ear cavities well-aerated. Soft tissues: Preseptal soft tissue swelling/ecchymosis ANTERIOR to the RIGHT eye. Ecchymosis/hematoma in the RIGHT cheek beneath the RIGHT eye. No evidence of soft tissue hematoma elsewhere. Note is made of  BILATERAL cervical carotid atherosclerosis. CT CERVICAL SPINE FINDINGS Alignment: Anatomic alignment of the cervical spine. Facet joints anatomically aligned throughout with mild degenerative changes. Skull base and vertebrae: No fractures identified involving the cervical spine. Coronal reformatted images demonstrate an intact craniocervical junction, intact dens and intact lateral masses throughout. Soft tissues and spinal canal: No evidence of paraspinous or spinal canal hematoma. Mild multifactorial spinal stenosis at C5-6. Disc levels: Severe disc space narrowing and associated endplate hypertrophic changes at C4-5, C5-6, C6-7 and C7-T1. Moderate disc space narrowing at C2-3 and C3-4. Uncinate and to a lesser degree facet hypertrophy account for multilevel foraminal stenoses including mild BILATERAL C2-3, severe BILATERAL C3-4, severe RIGHT and moderate LEFT C4-5, severe LEFT and moderate RIGHT C5-6, moderate BILATERAL C6-7, and mild BILATERAL C7-T1. Upper chest: Visualized lung apices clear. Visualized superior mediastinum normal. Other: None. IMPRESSION: 1. Acute left frontal convexity subdural hematoma with maximum thickness of approximately 7 mm. No evidence of midline shift. 2. No fractures identified involving the facial bones. 3. No cervical spine fractures identified. 4. Multilevel degenerative disc disease, spondylosis and facet degenerative changes with multilevel foraminal stenoses as detailed above and mild multifactorial spinal  stenosis at C5-6. 5. 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 I telephoned these critical/emergent results to Carmon Sails, PA, of the emergency department at the time of interpretation on 07/09/2018 at 3:54 p.m. Electronically Signed   By: Evangeline Dakin M.D.   On: 07/09/2018 15:55   Ct Cervical Spine Wo Contrast  Result Date: 07/09/2018 CLINICAL DATA:  85 year old who fell earlier today. Patient essentially amnestic to the event and is unsure if he lost consciousness. RIGHT LOWER orbital ecchymosis. Initial encounter. EXAM: CT HEAD WITHOUT CONTRAST CT MAXILLOFACIAL WITHOUT CONTRAST CT CERVICAL SPINE WITHOUT CONTRAST TECHNIQUE: Multidetector CT imaging of the head, cervical spine, and maxillofacial structures were performed using the standard protocol without intravenous contrast. Multiplanar CT image reconstructions of the cervical spine and maxillofacial structures were also generated. COMPARISON:  MRI brain 06/21/2016. No prior cervical spine or maxillofacial imaging. FINDINGS: CT HEAD FINDINGS Brain: Acute LEFT frontal convexity subdural hematoma with maximum thickness of approximately 7 mm. Mild mass effect with effacement of the cortical sulci, but no evidence of midline shift. No evidence of parenchymal contusion/hemorrhage. No evidence of intraventricular or subarachnoid hemorrhage. Moderate age related cortical and deep atrophy. Moderate changes of small vessel disease of the white matter diffusely. No evidence of acute stroke. Vascular: Mild BILATERAL carotid siphon and vertebral artery atherosclerosis. No hyperdense vessel. Skull: No skull fracture or other focal osseous abnormality involving the skull. Other: None. CT MAXILLOFACIAL FINDINGS Osseous: No fractures identified involving the facial bones. Temporomandibular joints anatomically aligned with only mild degenerative changes. Orbits: No orbital fractures. No evidence of orbital hemorrhage on the RIGHT where there is  preseptal soft tissue swelling/ecchymosis. Benign senile calcifications noted in both globes. No evidence of hemorrhage involving the RIGHT globe. Sinuses: Near complete opacification of the LEFT maxillary sinus as noted on the prior MRI. Opacifications of multiple BILATERAL ethmoid air cells. Remaining paranasal sinuses well aerated, with only minimal, insignificant mucosal thickening involving the RIGHT maxillary sinus. Slight bony nasal septal deviation to the LEFT. BILATERAL mastoid air cells and BILATERAL middle ear cavities well-aerated. Soft tissues: Preseptal soft tissue swelling/ecchymosis ANTERIOR to the RIGHT eye. Ecchymosis/hematoma in the RIGHT cheek beneath the RIGHT eye. No evidence of soft tissue hematoma elsewhere. Note is made of BILATERAL cervical carotid atherosclerosis. CT CERVICAL SPINE FINDINGS Alignment: Anatomic alignment of the cervical spine.  Facet joints anatomically aligned throughout with mild degenerative changes. Skull base and vertebrae: No fractures identified involving the cervical spine. Coronal reformatted images demonstrate an intact craniocervical junction, intact dens and intact lateral masses throughout. Soft tissues and spinal canal: No evidence of paraspinous or spinal canal hematoma. Mild multifactorial spinal stenosis at C5-6. Disc levels: Severe disc space narrowing and associated endplate hypertrophic changes at C4-5, C5-6, C6-7 and C7-T1. Moderate disc space narrowing at C2-3 and C3-4. Uncinate and to a lesser degree facet hypertrophy account for multilevel foraminal stenoses including mild BILATERAL C2-3, severe BILATERAL C3-4, severe RIGHT and moderate LEFT C4-5, severe LEFT and moderate RIGHT C5-6, moderate BILATERAL C6-7, and mild BILATERAL C7-T1. Upper chest: Visualized lung apices clear. Visualized superior mediastinum normal. Other: None. IMPRESSION: 1. Acute left frontal convexity subdural hematoma with maximum thickness of approximately 7 mm. No evidence of  midline shift. 2. No fractures identified involving the facial bones. 3. No cervical spine fractures identified. 4. Multilevel degenerative disc disease, spondylosis and facet degenerative changes with multilevel foraminal stenoses as detailed above and mild multifactorial spinal stenosis at C5-6. 5. 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 I telephoned these critical/emergent results to Carmon Sails, PA, of the emergency department at the time of interpretation on 07/09/2018 at 3:54 p.m. Electronically Signed   By: Evangeline Dakin M.D.   On: 07/09/2018 15:55   Dg Humerus Right  Result Date: 07/09/2018 CLINICAL DATA:  Upper arm pain with loss of consciousness. Unsure if fell. EXAM: RIGHT HUMERUS - 2+ VIEW COMPARISON:  None. FINDINGS: The bones appear adequately mineralized. No evidence of acute fracture or dislocation. The alignment is normal at the elbow. The subacromial space is obliterated at the shoulder, consistent with a chronic rotator cuff tear. IMPRESSION: No acute osseous findings. Secondary signs of chronic rotator cuff tear. Electronically Signed   By: Richardean Sale M.D.   On: 07/09/2018 15:24   Ct Maxillofacial Wo Contrast  Result Date: 07/09/2018 CLINICAL DATA:  85 year old who fell earlier today. Patient essentially amnestic to the event and is unsure if he lost consciousness. RIGHT LOWER orbital ecchymosis. Initial encounter. EXAM: CT HEAD WITHOUT CONTRAST CT MAXILLOFACIAL WITHOUT CONTRAST CT CERVICAL SPINE WITHOUT CONTRAST TECHNIQUE: Multidetector CT imaging of the head, cervical spine, and maxillofacial structures were performed using the standard protocol without intravenous contrast. Multiplanar CT image reconstructions of the cervical spine and maxillofacial structures were also generated. COMPARISON:  MRI brain 06/21/2016. No prior cervical spine or maxillofacial imaging. FINDINGS: CT HEAD FINDINGS Brain: Acute LEFT frontal convexity subdural hematoma with  maximum thickness of approximately 7 mm. Mild mass effect with effacement of the cortical sulci, but no evidence of midline shift. No evidence of parenchymal contusion/hemorrhage. No evidence of intraventricular or subarachnoid hemorrhage. Moderate age related cortical and deep atrophy. Moderate changes of small vessel disease of the white matter diffusely. No evidence of acute stroke. Vascular: Mild BILATERAL carotid siphon and vertebral artery atherosclerosis. No hyperdense vessel. Skull: No skull fracture or other focal osseous abnormality involving the skull. Other: None. CT MAXILLOFACIAL FINDINGS Osseous: No fractures identified involving the facial bones. Temporomandibular joints anatomically aligned with only mild degenerative changes. Orbits: No orbital fractures. No evidence of orbital hemorrhage on the RIGHT where there is preseptal soft tissue swelling/ecchymosis. Benign senile calcifications noted in both globes. No evidence of hemorrhage involving the RIGHT globe. Sinuses: Near complete opacification of the LEFT maxillary sinus as noted on the prior MRI. Opacifications of multiple BILATERAL ethmoid air cells. Remaining paranasal sinuses well  aerated, with only minimal, insignificant mucosal thickening involving the RIGHT maxillary sinus. Slight bony nasal septal deviation to the LEFT. BILATERAL mastoid air cells and BILATERAL middle ear cavities well-aerated. Soft tissues: Preseptal soft tissue swelling/ecchymosis ANTERIOR to the RIGHT eye. Ecchymosis/hematoma in the RIGHT cheek beneath the RIGHT eye. No evidence of soft tissue hematoma elsewhere. Note is made of BILATERAL cervical carotid atherosclerosis. CT CERVICAL SPINE FINDINGS Alignment: Anatomic alignment of the cervical spine. Facet joints anatomically aligned throughout with mild degenerative changes. Skull base and vertebrae: No fractures identified involving the cervical spine. Coronal reformatted images demonstrate an intact craniocervical  junction, intact dens and intact lateral masses throughout. Soft tissues and spinal canal: No evidence of paraspinous or spinal canal hematoma. Mild multifactorial spinal stenosis at C5-6. Disc levels: Severe disc space narrowing and associated endplate hypertrophic changes at C4-5, C5-6, C6-7 and C7-T1. Moderate disc space narrowing at C2-3 and C3-4. Uncinate and to a lesser degree facet hypertrophy account for multilevel foraminal stenoses including mild BILATERAL C2-3, severe BILATERAL C3-4, severe RIGHT and moderate LEFT C4-5, severe LEFT and moderate RIGHT C5-6, moderate BILATERAL C6-7, and mild BILATERAL C7-T1. Upper chest: Visualized lung apices clear. Visualized superior mediastinum normal. Other: None. IMPRESSION: 1. Acute left frontal convexity subdural hematoma with maximum thickness of approximately 7 mm. No evidence of midline shift. 2. No fractures identified involving the facial bones. 3. No cervical spine fractures identified. 4. Multilevel degenerative disc disease, spondylosis and facet degenerative changes with multilevel foraminal stenoses as detailed above and mild multifactorial spinal stenosis at C5-6. 5. 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 I telephoned these critical/emergent results to Carmon Sails, PA, of the emergency department at the time of interpretation on 07/09/2018 at 3:54 p.m. Electronically Signed   By: Evangeline Dakin M.D.   On: 07/09/2018 15:55    Assessment/Plan 1. SDH (subdural hematoma) (HCC) -due to mechanical fall on his deck  -no severe complications fortunately -he is doing great cognitively--slow finding some words, but memory intact and alert and oriented -recovering well  2. Type 2 diabetes mellitus with stage 3 chronic kidney disease, without long-term current use of insulin (HCC) -has been well controlled without hypoglycemia -keep scheduled labs before we meet in June -cont losartan for renal protection and baby asa  3.  Other specified hypothyroidism -cont current levothyroxine, f/u tsh as planned  4. Essential hypertension -bps may be running higher since his last labs had shown worsening renal function and we opted to stop his losartan/hct and use just losartan alone -he will check his bp three times a week about an hour after his medication and record the results--we will review these at his visit in 6 wks  5. Injury of right shoulder, sequela -continue the exercises from prior shoulder PT with rotator cuff   6. Fall, sequela -mechanical due to loose board in deck  7. Anxiety state -continues on his xanax which he's been on for years--he has not done well when we've attempted discontinuation and he's aware of risks I've reviewed with him  8. Hyperlipidemia, unspecified hyperlipidemia type -last LDL was 80, he is 55 so would not be aggressive about statins, but since he does take one, we might as well switch to lipitor or crestor at his visit and get him to goal of less than 70 with diabetes  9. Closed fracture of one rib of right side, sequela -doing well with breathing exercises, cont these at home  10.  ACP discussion held today -we reviewed who  would make decisions for him and importance of putting this on paper in a HCPOA--he reports he will do this, but has put it off or forgotten before -also reviewed DNR vs full code--he does request DNR status if he had a cardiopulmonary arrest--we will be sure to do this form at his next visit and likely will do a MOST form, as well (no way to do e-signature for him at this point so did defer)--20 minutes spent on ACP  Labs/tests ordered:  Keep scheduled labs Next appt:  Make June appt with me as I've been his PCP  Medications were reconciled from pre-admission, discharge and current state.  Non face-to-face time spent on telehealth visit:  28 minutes  Stratton Villwock L. Darlys Buis, D.O. [facility] [address], [place] [address] Cell Phone (Mon-Fri 8am-5pm):  [phone] On Call:  [phone] & follow prompts after 5pm & weekends Office Phone:  [phone] Office Fax:  [phone]

## 2018-07-14 ENCOUNTER — Telehealth: Payer: Self-pay

## 2018-07-14 DIAGNOSIS — I10 Essential (primary) hypertension: Secondary | ICD-10-CM

## 2018-07-14 MED ORDER — AMLODIPINE BESYLATE 5 MG PO TABS: 5.0000 mg | ORAL_TABLET | Freq: Every day | ORAL | 3 refills | Status: DC

## 2018-07-14 NOTE — Telephone Encounter (Signed)
Patient called in an uproar demanding to speak with Dr.Reed regarding his B/P and his B/P medication.  Patient's B/P was 170/80 and  178/80 1 hour and 1/2 after taking medications. Patient states he never had this problem when the HCTZ was part of his regimen.   Patient would like his medication changed ASAP and prefers to speak directly with Dr.Reed. Patient aware Dr.Reed not in office today and I will send a message

## 2018-07-14 NOTE — Telephone Encounter (Signed)
I would like to add amlodipine 5mg  by mouth daily to his regimen.  He should continue to monitor his blood pressures and call back if they stay over 160 consistently (top number).  This should be safer for him than the diuretic which is not recommended in older adults due to risk of dehydration and dropping blood pressure when standing.

## 2018-07-14 NOTE — Telephone Encounter (Signed)
Spoke with patient and advised results, he didn't have any questions.

## 2018-07-18 ENCOUNTER — Other Ambulatory Visit: Payer: Self-pay | Admitting: Internal Medicine

## 2018-07-21 NOTE — Telephone Encounter (Signed)
Patient had an appointment on 07/13/2018 with Dr.Reed

## 2018-07-26 DIAGNOSIS — I10 Essential (primary) hypertension: Secondary | ICD-10-CM | POA: Diagnosis not present

## 2018-07-26 DIAGNOSIS — S065X9A Traumatic subdural hemorrhage with loss of consciousness of unspecified duration, initial encounter: Secondary | ICD-10-CM | POA: Diagnosis not present

## 2018-08-01 ENCOUNTER — Other Ambulatory Visit: Payer: Self-pay

## 2018-08-01 DIAGNOSIS — G4709 Other insomnia: Secondary | ICD-10-CM

## 2018-08-01 MED ORDER — TEMAZEPAM 15 MG PO CAPS: 15.0000 mg | ORAL_CAPSULE | Freq: Every evening | ORAL | 0 refills | Status: DC | PRN

## 2018-08-01 NOTE — Telephone Encounter (Signed)
Verified patient pharmacy, verified medication in [place] data base last filled 06/27/2018 for 30 tabs no refills.  Next appointment 09/04/2018 and last appointment 07/13/2018.

## 2018-08-25 ENCOUNTER — Other Ambulatory Visit: Payer: Self-pay

## 2018-08-25 ENCOUNTER — Other Ambulatory Visit: Payer: Medicare Other

## 2018-08-25 DIAGNOSIS — E785 Hyperlipidemia, unspecified: Secondary | ICD-10-CM | POA: Diagnosis not present

## 2018-08-25 DIAGNOSIS — E039 Hypothyroidism, unspecified: Secondary | ICD-10-CM

## 2018-08-25 DIAGNOSIS — E1122 Type 2 diabetes mellitus with diabetic chronic kidney disease: Secondary | ICD-10-CM

## 2018-08-25 DIAGNOSIS — N183 Chronic kidney disease, stage 3 (moderate): Secondary | ICD-10-CM | POA: Diagnosis not present

## 2018-08-26 LAB — COMPLETE METABOLIC PANEL WITH GFR
AG Ratio: 1.8 (calc) (ref 1.0–2.5)
ALT: 9 U/L (ref 9–46)
AST: 15 U/L (ref 10–35)
Albumin: 3.9 g/dL (ref 3.6–5.1)
Alkaline phosphatase (APISO): 52 U/L (ref 35–144)
BUN: 24 mg/dL (ref 7–25)
CO2: 26 mmol/L (ref 20–32)
Calcium: 9 mg/dL (ref 8.6–10.3)
Chloride: 108 mmol/L (ref 98–110)
Creat: 0.98 mg/dL (ref 0.70–1.11)
GFR, Est African American: 79 mL/min/{1.73_m2} (ref >=60)
GFR, Est Non African American: 69 mL/min/{1.73_m2} (ref >=60)
Globulin: 2.2 g/dL (calc) (ref 1.9–3.7)
Glucose, Bld: 80 mg/dL (ref 65–99)
Potassium: 4.6 mmol/L (ref 3.5–5.3)
Sodium: 140 mmol/L (ref 135–146)
Total Bilirubin: 0.6 mg/dL (ref 0.2–1.2)
Total Protein: 6.1 g/dL (ref 6.1–8.1)

## 2018-08-26 LAB — LIPID PANEL
Cholesterol: 144 mg/dL (ref <200)
HDL: 48 mg/dL (ref >=40)
LDL Cholesterol (Calc): 82 mg/dL (calc)
Non-HDL Cholesterol (Calc): 96 mg/dL (calc) (ref <130)
Total CHOL/HDL Ratio: 3 (calc) (ref <5.0)
Triglycerides: 61 mg/dL (ref <150)

## 2018-08-26 LAB — TSH: TSH: 4.32 mIU/L (ref 0.40–4.50)

## 2018-08-26 LAB — HEMOGLOBIN A1C
Hgb A1c MFr Bld: 5.7 % of total Hgb — ABNORMAL HIGH (ref <5.7)
Mean Plasma Glucose: 117 (calc)
eAG (mmol/L): 6.5 (calc)

## 2018-08-28 ENCOUNTER — Telehealth: Payer: Self-pay | Admitting: *Deleted

## 2018-08-28 NOTE — Telephone Encounter (Signed)
I would like for him to come in sooner if possible because of his prior subdural hematoma from a fall.  I'd like for [place] to be able to perform a neurologic exam to be sure he is ok.  Would he be willing to come in today to see Janett Billow?

## 2018-08-28 NOTE — Telephone Encounter (Signed)
Patient called and stated that he fell Sunday. Stated that he had gotten up early and went into the [facility] and saw a bug on the carpet and went to hit it with a newspaper and fell forward and hit his head. Stated that he had a small gash that bleed but nothing else. Stated that he is fine. Stated that his family kept talking to him and made sure he stayed awake for a while. Stated that he is good and no bruises or bumps. Stated his family wanted him to call you to make you aware. Stated that he has an upcoming appointment soon. FYI.

## 2018-08-28 NOTE — Telephone Encounter (Signed)
Patient scheduled an appointment for tomorrow morning with Janett Billow.

## 2018-08-29 ENCOUNTER — Encounter (HOSPITAL_COMMUNITY): Payer: Self-pay

## 2018-08-29 ENCOUNTER — Ambulatory Visit: Payer: Self-pay | Admitting: Nurse Practitioner

## 2018-08-29 ENCOUNTER — Emergency Department (HOSPITAL_COMMUNITY): Payer: Medicare Other

## 2018-08-29 ENCOUNTER — Emergency Department (HOSPITAL_COMMUNITY)
Admission: EM | Admit: 2018-08-29 | Discharge: 2018-08-29 | Disposition: A | Discharge status: Home / Self Care | Payer: Medicare Other | Attending: Emergency Medicine | Admitting: Emergency Medicine

## 2018-08-29 DIAGNOSIS — E039 Hypothyroidism, unspecified: Secondary | ICD-10-CM | POA: Diagnosis not present

## 2018-08-29 DIAGNOSIS — S0191XD Laceration without foreign body of unspecified part of head, subsequent encounter: Secondary | ICD-10-CM | POA: Diagnosis not present

## 2018-08-29 DIAGNOSIS — Y929 Unspecified place or not applicable: Secondary | ICD-10-CM | POA: Diagnosis not present

## 2018-08-29 DIAGNOSIS — Y939 Activity, unspecified: Secondary | ICD-10-CM | POA: Diagnosis not present

## 2018-08-29 DIAGNOSIS — W19XXXA Unspecified fall, initial encounter: Secondary | ICD-10-CM | POA: Diagnosis not present

## 2018-08-29 DIAGNOSIS — Z96651 Presence of right artificial knee joint: Secondary | ICD-10-CM | POA: Insufficient documentation

## 2018-08-29 DIAGNOSIS — Z87891 Personal history of nicotine dependence: Secondary | ICD-10-CM | POA: Diagnosis not present

## 2018-08-29 DIAGNOSIS — N183 Chronic kidney disease, stage 3 (moderate): Secondary | ICD-10-CM | POA: Insufficient documentation

## 2018-08-29 DIAGNOSIS — Z7982 Long term (current) use of aspirin: Secondary | ICD-10-CM | POA: Diagnosis not present

## 2018-08-29 DIAGNOSIS — S0990XA Unspecified injury of head, initial encounter: Secondary | ICD-10-CM | POA: Insufficient documentation

## 2018-08-29 DIAGNOSIS — Y998 Other external cause status: Secondary | ICD-10-CM | POA: Insufficient documentation

## 2018-08-29 DIAGNOSIS — R42 Dizziness and giddiness: Secondary | ICD-10-CM | POA: Insufficient documentation

## 2018-08-29 DIAGNOSIS — I129 Hypertensive chronic kidney disease with stage 1 through stage 4 chronic kidney disease, or unspecified chronic kidney disease: Secondary | ICD-10-CM | POA: Insufficient documentation

## 2018-08-29 DIAGNOSIS — E1122 Type 2 diabetes mellitus with diabetic chronic kidney disease: Secondary | ICD-10-CM | POA: Diagnosis not present

## 2018-08-29 DIAGNOSIS — I6782 Cerebral ischemia: Secondary | ICD-10-CM | POA: Diagnosis not present

## 2018-08-29 DIAGNOSIS — Z7984 Long term (current) use of oral hypoglycemic drugs: Secondary | ICD-10-CM | POA: Diagnosis not present

## 2018-08-29 DIAGNOSIS — Z79899 Other long term (current) drug therapy: Secondary | ICD-10-CM | POA: Insufficient documentation

## 2018-08-29 LAB — CBC WITH DIFFERENTIAL/PLATELET
Abs Immature Granulocytes: 0.02 10*3/uL (ref 0.00–0.07)
Basophils Absolute: 0 10*3/uL (ref 0.0–0.1)
Basophils Relative: 1 %
Eosinophils Absolute: 0.4 10*3/uL (ref 0.0–0.5)
Eosinophils Relative: 6 %
HCT: 42 % (ref 39.0–52.0)
Hemoglobin: 14 g/dL (ref 13.0–17.0)
Immature Granulocytes: 0 %
Lymphocytes Relative: 19 %
Lymphs Abs: 1.2 10*3/uL (ref 0.7–4.0)
MCH: 30.4 pg (ref 26.0–34.0)
MCHC: 33.3 g/dL (ref 30.0–36.0)
MCV: 91.3 fL (ref 80.0–100.0)
Monocytes Absolute: 0.7 10*3/uL (ref 0.1–1.0)
Monocytes Relative: 10 %
Neutro Abs: 4.2 10*3/uL (ref 1.7–7.7)
Neutrophils Relative %: 64 %
Platelets: 152 10*3/uL (ref 150–400)
RBC: 4.6 MIL/uL (ref 4.22–5.81)
RDW: 13.8 % (ref 11.5–15.5)
WBC: 6.5 10*3/uL (ref 4.0–10.5)
nRBC: 0 % (ref 0.0–0.2)

## 2018-08-29 LAB — BASIC METABOLIC PANEL
Anion gap: 10 (ref 5–15)
BUN: 23 mg/dL (ref 8–23)
CO2: 23 mmol/L (ref 22–32)
Calcium: 9 mg/dL (ref 8.9–10.3)
Chloride: 106 mmol/L (ref 98–111)
Creatinine, Ser: 1.05 mg/dL (ref 0.61–1.24)
GFR calc Af Amer: >60 mL/min (ref >60)
GFR calc non Af Amer: >60 mL/min (ref >60)
Glucose, Bld: 97 mg/dL (ref 70–99)
Potassium: 3.9 mmol/L (ref 3.5–5.1)
Sodium: 139 mmol/L (ref 135–145)

## 2018-08-29 MED ORDER — SODIUM CHLORIDE 0.9 % IV BOLUS
1000.0000 mL | Freq: Once | INTRAVENOUS | Status: AC
  Administered 2018-08-29: 1000 mL via INTRAVENOUS

## 2018-08-29 NOTE — ED Notes (Signed)
Cancel urine collection per EDP

## 2018-08-29 NOTE — ED Provider Notes (Signed)
[facility] DEPT Provider Note   CSN: [id number] Arrival date & time: 08/29/18  0901    History   Chief Complaint Chief Complaint  Patient presents with  . Fall    on Sunday    HPI Bobby Jensen is a 85 y.o. male.     The history is provided by the patient.  Dizziness Quality:  Lightheadedness Severity:  Mild Onset quality:  Gradual Timing:  Intermittent Progression:  Improving Chronicity:  New Context: standing up (patient struck head two days ago and has had some headache and lightheadedness and worse this morning when he first got up. Improved now. )   Relieved by:  Being still Worsened by:  Standing up Associated symptoms: no blood in stool, no chest pain, no diarrhea, no headaches, no hearing loss, no nausea, no palpitations, no shortness of breath, no syncope, no tinnitus, no vision changes, no vomiting and no weakness     Past Medical History:  Diagnosis Date  . Bladder neck obstruction 03/28/2008  . Closed rib fracture   . Contact with or exposure to tuberculosis 03/15/1992  . Elevated prostate specific antigen (PSA) 03/15/1998  . Gastric ulcer, unspecified as acute or chronic, without mention of hemorrhage, perforation, or obstruction 03/15/1993  . Hypertension 07/08/1998  . Hypertrophy of prostate without urinary obstruction and other lower urinary tract symptoms (LUTS) 03/15/1989  . 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 Inguinal hernia without mention of obstruction or gangrene, unilateral or unspecified, (not specified as recurrent) 11/20/2008  . Insomnia, unspecified 11/01/2003  . Kidney calculi 08/20/2009  . Other and unspecified hyperlipidemia 12/23/2004  . Other malaise and fatigue 12/23/2004  . Pain in joint, pelvic region and thigh 12/17/2009  . Pain in joint, shoulder region 11/01/2003  . Peptic ulcer, unspecified site, unspecified as acute or chronic, without mention of hemorrhage, perforation, or  obstruction 06/25/2003  . Personal history of malaria 03/16/1939  . Right rotator cuff tear 07/10/2018   Chronic per imaging 4/20  . Right shoulder injury 07/10/2018  . Rosacea 08/20/2009  . SDH (subdural hematoma) ([place])   . Type II or unspecified type diabetes mellitus without mention of complication, not stated as uncontrolled 07/15/2006  . Unspecified constipation 08/05/2010  . Unspecified hypothyroidism 12/17/2009    Patient Active Problem List   Diagnosis Date Noted  . Right rotator cuff tear 07/10/2018  . Right shoulder injury 07/10/2018  . SDH (subdural hematoma) ([place]) 07/09/2018  . Fall   . Closed fracture of one rib of right side   . Primary osteoarthritis of right hip 03/17/2017  . Bell's palsy 05/06/2015  . Dizziness 11/01/2014  . Bursitis of right hip 05/23/2014  . Abnormal x-ray 05/23/2014  . Bradycardia 07/11/2013  . Anxiety state 07/11/2013  . Hypothyroidism 12/17/2009  . Type II diabetes mellitus with stage 3 chronic kidney disease ([place]) 07/15/2006  . Hyperlipidemia 12/23/2004  . Insomnia 11/01/2003  . Hypertension 07/08/1998  . 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 Personal history of malaria 03/16/1939    Past Surgical History:  Procedure Laterality Date  . APPENDECTOMY  1956  . HEMORROIDECTOMY  06/2002   Dr. Lennie Hummer  . HERNIA REPAIR Bilateral 11/2008   inguinal  . PILONIDAL CYST EXCISION  1963  . PROSTATE SURGERY    . TOTAL HIP ARTHROPLASTY Right 07/10/2009   Dr. Telford Nab        Home Medications    Prior to Admission medications   Medication Sig Start Date End Date Taking?  Authorizing Provider  acetaminophen (TYLENOL) 325 MG tablet Take 2 tablets (650 mg total) by mouth every 6 (six) hours as needed for mild pain (or Fever >/= 101). 07/10/18  Yes Black, Lezlie Octave, NP  ALPRAZolam Duanne Moron) 0.25 MG tablet Take 0.25 mg by mouth daily as needed for anxiety.   Yes [provider]  amLODipine (NORVASC) 5 MG tablet Take 1 tablet (5 mg total)  by mouth daily. 07/14/18  Yes Reed, Tiffany L, DO  aspirin 81 MG tablet Take 1 tablet (81 mg total) by mouth daily. 07/24/18  Yes Black, Lezlie Octave, NP  Cholecalciferol (VITAMIN D PO) Take 1,000 Units by mouth daily.    Yes [provider]  glimepiride (AMARYL) 1 MG tablet TAKE 1 TABLET BY MOUTH EVERY DAY Patient taking differently: Take 1 mg by mouth daily with breakfast.  12/07/17  Yes Reed, Tiffany L, DO  levothyroxine (SYNTHROID) 50 MCG tablet TAKE 1 TABLET BY MOUTH EVERY DAY FOR THYROID Patient taking differently: Take 50 mcg by mouth daily before breakfast.  07/03/18  Yes Reed, Tiffany L, DO  losartan (COZAAR) 100 MG tablet Take 1 tablet (100 mg total) by mouth daily. 12/09/17  Yes Reed, Tiffany L, DO  nitroGLYCERIN (NITROSTAT) 0.4 MG SL tablet Place 1 tablet (0.4 mg total) under the tongue every 5 (five) minutes as needed for chest pain. 08/27/16  Yes Dorothy Spark, MD  simvastatin (ZOCOR) 20 MG tablet TAKE 1 TABLET BY MOUTH EVERY NIGHT AT BEDTIME Patient taking differently: Take 20 mg by mouth daily at 6 PM.  07/18/18  Yes Reed, Tiffany L, DO  temazepam (RESTORIL) 15 MG capsule Take 1 capsule (15 mg total) by mouth at bedtime as needed for sleep. 08/01/18  Yes Reed, Tiffany L, DO  triamcinolone cream (KENALOG) 0.1 % APPLY EXTERNALLY TO RASH TWICE DAILY AS NEEDED Patient taking differently: Apply 1 application topically 2 (two) times daily as needed (for rash).  02/27/18  Yes Reed, Tiffany L, DO  glucose blood (ONE TOUCH ULTRA TEST) test strip USE TO CHECK BLOOD SUGAR ONCE DAILY AND AS DIRECTED 12/07/17   Mariea Clonts, Tiffany L, DO  [facility] DELICA LANCETS 50Y MISC Check blood sugar once daily as directed E11.22 12/12/17   Gayland Curry, DO    Family History Family History  Problem Relation Age of Onset  . Heart disease Father     Social History Social History   Tobacco Use  . Smoking status: Former Smoker    Quit date: 07/31/1978    Years since quitting: 40.1  . Smokeless tobacco:  Never Used  Substance Use Topics  . Alcohol use: Yes    Comment: Occ. glass of wine  . Drug use: No     Allergies   Fioricet [butalbital-apap-caffeine], Other, and Tussin [guaifenesin]   Review of Systems Review of Systems  Constitutional: Negative for chills and fever.  HENT: Negative for ear pain, hearing loss, sore throat and tinnitus.   Eyes: Negative for pain and visual disturbance.  Respiratory: Negative for cough and shortness of breath.   Cardiovascular: Negative for chest pain, palpitations and syncope.  Gastrointestinal: Negative for abdominal pain, blood in stool, diarrhea, nausea and vomiting.  Genitourinary: Negative for dysuria and hematuria.  Musculoskeletal: Positive for gait problem (improved). Negative for arthralgias, back pain, joint swelling, myalgias, neck pain and neck stiffness.  Skin: Negative for color change and rash.  Neurological: Positive for light-headedness. Negative for tremors, seizures, syncope, facial asymmetry, speech difficulty, weakness, numbness and headaches.  All  other systems reviewed and are negative.    Physical Exam Updated Vital Signs  ED Triage Vitals  Enc Vitals Group     BP 08/29/18 0913 (!) 153/86     Pulse Rate 08/29/18 0913 64     Resp 08/29/18 0913 18     Temp 08/29/18 0914 (!) 97.5 F (36.4 C)     Temp Source 08/29/18 0914 Oral     SpO2 08/29/18 0907 96 %     Weight 08/29/18 0917 155 lb (70.3 kg)     Height 08/29/18 0917 5\' 5"  (1.651 m)     Head Circumference --      Peak Flow --      Pain Score 08/29/18 0912 0     Pain Loc --      Pain Edu? --      Excl. in [place]? --     Physical Exam Vitals signs and nursing note reviewed.  Constitutional:      General: He is not in acute distress.    Appearance: He is well-developed. He is not ill-appearing.  HENT:     Head: Normocephalic.     Nose: Nose normal.     Mouth/Throat:     Mouth: Mucous membranes are moist.  Eyes:     Extraocular Movements: Extraocular  movements intact.     Conjunctiva/sclera: Conjunctivae normal.     Pupils: Pupils are equal, round, and reactive to light.  Neck:     Musculoskeletal: Normal range of motion and neck supple. No muscular tenderness.  Cardiovascular:     Rate and Rhythm: Normal rate and regular rhythm.     Pulses: Normal pulses.     Heart sounds: Normal heart sounds. No murmur.  Pulmonary:     Effort: Pulmonary effort is normal. No respiratory distress.     Breath sounds: Normal breath sounds.  Abdominal:     General: There is no distension.     Palpations: Abdomen is soft.     Tenderness: There is no abdominal tenderness.  Musculoskeletal: Normal range of motion.        General: No tenderness.  Skin:    General: Skin is warm and dry.     Comments: Abrasion to left side of head  Neurological:     General: No focal deficit present.     Mental Status: He is alert and oriented to person, place, and time.     Cranial Nerves: No cranial nerve deficit.     Sensory: No sensory deficit.     Motor: No weakness.     Coordination: Coordination normal.     Gait: Gait normal.     Comments: 5+ out of 5 strength throughout, normal sensation, no drift, able to ambulate without much difficulty      ED Treatments / Results  Labs (all labs ordered are listed, but only abnormal results are displayed) Labs Reviewed  CBC WITH DIFFERENTIAL/PLATELET  BASIC METABOLIC PANEL    EKG EKG Interpretation  Date/Time:  Tuesday August 29 2018 10:14:40 EDT Ventricular Rate:  57 PR Interval:    QRS Duration: 104 QT Interval:  431 QTC Calculation: 420 R Axis:   0 Text Interpretation:  Sinus rhythm Multiple ventricular premature complexes RSR' in V1 or V2, right VCD or RVH Confirmed by Lennice Sites [id number]) on 08/29/2018 10:18:18 AM   Radiology Ct Head Wo Contrast  Result Date: 08/29/2018 CLINICAL DATA:  The patient suffered a blow to the left head with a laceration 09/26/2018. EXAM: CT  HEAD WITHOUT CONTRAST  TECHNIQUE: Contiguous axial images were obtained from the base of the skull through the vertex without intravenous contrast. COMPARISON:  Head CT scan 07/10/2018. FINDINGS: Brain: No evidence of acute infarction, hemorrhage, hydrocephalus, extra-axial collection or mass lesion/mass effect. Atrophy and chronic microvascular ischemic change noted. Vascular: No hyperdense vessel or unexpected calcification. Skull: Intact.  No focal lesion. Sinuses/Orbits: Visualized left maxillary sinus is almost completely opacified, unchanged. Scattered ethmoid air cell disease is also unchanged. Other: None. IMPRESSION: No acute abnormality. No change in left maxillary sinus and scattered ethmoid air cell disease. Electronically Signed   By: Inge Rise M.D.   On: 08/29/2018 11:23    Procedures Procedures (including critical care time)  Medications Ordered in ED Medications  sodium chloride 0.9 % bolus 1,000 mL (1,000 mLs Intravenous New Bag/Given 08/29/18 0939)     Initial Impression / Assessment and Plan / ED Course  I have reviewed the triage vital signs and the nursing notes.  Pertinent labs & imaging results that were available during my care of the patient were reviewed by me and considered in my medical decision making (see chart for details).        Bobby Jensen is an 85 year old male with history of hypertension, BPH who presents to the ED with lightheadedness.  Patient with normal vitals.  No fever.  Patient tripped and fell 2 days ago and hit the left side of his head.  Not lose consciousness.  Has had mild headache since.  Patient states that when he woke up this morning he felt lightheaded but has now improved.  Has normal neurological exam.  Is able to ambulate without any issues.  Appears mildly dehydrated.  Could also be mild concussion.  No concern for stroke at this time.  Lab work shows no significant anemia, electrolyte abnormality, kidney injury.  Patient will get IV fluids, CT of  the head, urinalysis.  Will obtain EKG.  Patient denies any chest pain, shortness of breath.  Overall is well-appearing.  Could be mildly orthostatic as well.  Patient with no significant anemia, electrolyte abnormality, kidney injury.  CT of the head unremarkable.  Patient feels improved following IV fluids.  Suspect that patient likely with mild concussion.  EKG also within normal limits.  No signs of arrhythmia.  Patient neurologically intact throughout my stay.  Discharged in ED in good condition and given return precautions.  This chart was dictated using voice recognition software.  Despite best efforts to proofread,  errors can occur which can change the documentation meaning.    Final Clinical Impressions(s) / ED Diagnoses   Final diagnoses:  Injury of head, initial encounter    ED Discharge Orders    None       Lennice Sites, DO 08/29/18 1238

## 2018-08-29 NOTE — ED Notes (Signed)
Bed: IQ79 Expected date:  Expected time:  Means of arrival:  Comments: EMS 85yo fall

## 2018-08-29 NOTE — ED Notes (Signed)
EDP at bedside  

## 2018-08-29 NOTE — ED Notes (Signed)
Urinal at bedside. Pt encouraged to provide specimen per MD order.   

## 2018-08-29 NOTE — ED Notes (Signed)
Pt d/c home per MD order. Discharge summary reviewed, pt verbalizes understanding. Pt daughter discharge ride home. Off unit via WC.

## 2018-08-29 NOTE — ED Notes (Signed)
This nurse contacted pt daughter per pt request, reports is on the way to pick up pt for discharge.,

## 2018-08-29 NOTE — ED Triage Notes (Signed)
Pt to ED via EMS from home. Pt fell at home on Sunday. Laceration to left side of head. No LOC. Pt not taking blood thinners. Pt ambulatory with EMS. Pt at baseline orientation alert and oriented x 4. Pt to ED today d/t family concerns.

## 2018-08-29 NOTE — ED Notes (Signed)
From EMS: pt's daughter Marijo Sanes, [phone]

## 2018-08-29 NOTE — ED Notes (Signed)
Patient transported to CT 

## 2018-08-30 ENCOUNTER — Ambulatory Visit: Payer: Medicare Other | Admitting: Nurse Practitioner

## 2018-08-31 ENCOUNTER — Other Ambulatory Visit: Payer: Self-pay | Admitting: Internal Medicine

## 2018-08-31 DIAGNOSIS — G4709 Other insomnia: Secondary | ICD-10-CM

## 2018-09-04 ENCOUNTER — Other Ambulatory Visit: Payer: Self-pay

## 2018-09-04 ENCOUNTER — Ambulatory Visit (INDEPENDENT_AMBULATORY_CARE_PROVIDER_SITE_OTHER): Payer: Medicare Other | Admitting: Internal Medicine

## 2018-09-04 ENCOUNTER — Encounter: Payer: Self-pay | Admitting: Internal Medicine

## 2018-09-04 VITALS — BP 130/68 | HR 67 | Temp 98.0°F | Ht 65.0 in | Wt 157.0 lb

## 2018-09-04 DIAGNOSIS — E785 Hyperlipidemia, unspecified: Secondary | ICD-10-CM

## 2018-09-04 DIAGNOSIS — I10 Essential (primary) hypertension: Secondary | ICD-10-CM

## 2018-09-04 DIAGNOSIS — E1122 Type 2 diabetes mellitus with diabetic chronic kidney disease: Secondary | ICD-10-CM | POA: Diagnosis not present

## 2018-09-04 DIAGNOSIS — W19XXXS Unspecified fall, sequela: Secondary | ICD-10-CM

## 2018-09-04 DIAGNOSIS — N183 Chronic kidney disease, stage 3 unspecified: Secondary | ICD-10-CM

## 2018-09-04 DIAGNOSIS — E038 Other specified hypothyroidism: Secondary | ICD-10-CM | POA: Diagnosis not present

## 2018-09-04 DIAGNOSIS — G510 Bell's palsy: Secondary | ICD-10-CM | POA: Diagnosis not present

## 2018-09-04 NOTE — Progress Notes (Signed)
Location:  [facility] clinic Provider:  Kassidie Hendriks L. Mariea Clonts, D.O., C.M.D.  Code Status: DNR Goals of Care:  Advanced Directives 08/29/2018  Does Patient Have a Medical Advance Directive? No  Type of Advance Directive -  Copy of [place] in Chart? -  Would patient like information on creating a medical advance directive? No - Patient declined     Chief Complaint  Patient presents with   Medical Management of Chronic Issues    46mth follow-up   ER follow-up    fall     HPI: Patient is a 85 y.o. male seen today for medical management of chronic diseases.    Got up 5:30am, saw bug on the way to the restroom.  Had paper in his hands to swat it and it was not dead.  He leaned forward to far and fell.  He went to ED and had some concussion.    He is taking the amlodipine with losartan.  BP doing well at home--running in 130s, rarely in 140s.  Great today at 130/68.    He reports that his allergies in his chart are all incorrect.  He's never had these.  He would ike them removed.  He has had some raw oyster juice in can allergy.  Able to eat the oysters themselves.  His eyes got swollen.    He reports he is using just 1/2 an amaryl 1mg  due to hypoglycemia to 60s at times if he takes a full tablet daily as we'd had ordered.  He's lost a few lbs and his hba1c has come down into prediabetic range again.  Reviewed his labs with him in full today.  Overall, they are great with improved renal function back to baseline.    He inquires about his wife switching back over here if it's not possible to see Dr. Maudie Mercury as she needs and I agreed.  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   Inguinal hernia without mention of obstruction or gangrene, unilateral or unspecified, (not specified as recurrent) 11/20/2008   Insomnia, unspecified 11/01/2003   Kidney calculi 08/20/2009   Other and unspecified hyperlipidemia 12/23/2004   Other malaise and fatigue 12/23/2004   Pain in joint, pelvic region and thigh 12/17/2009   Pain in joint, shoulder region 11/01/2003   Peptic ulcer, unspecified site, unspecified as acute or chronic, without mention of hemorrhage, perforation, or obstruction 06/25/2003   Personal history of malaria 03/16/1939   Right rotator cuff tear 07/10/2018   Chronic per imaging 4/20   Right shoulder injury 07/10/2018   Rosacea 08/20/2009   SDH (subdural hematoma) (HCC)    Type II or unspecified type diabetes mellitus without mention of complication, not stated as uncontrolled 07/15/2006   Unspecified constipation 08/05/2010   Unspecified hypothyroidism 12/17/2009    Past Surgical History:  Procedure Laterality Date   APPENDECTOMY  1956   HEMORROIDECTOMY  06/2002   Dr. Lennie Hummer   HERNIA REPAIR Bilateral 11/2008   inguinal   PILONIDAL CYST EXCISION  1963   PROSTATE SURGERY     TOTAL  HIP ARTHROPLASTY Right 07/10/2009   Dr. Telford Nab    Allergies  Allergen Reactions   Fioricet [Butalbital-Apap-Caffeine]     Unaware of this allergy per patient.    Other Other (See Comments)    Profen II DM= urinary outlet obstruction Unaware of this allergy per patient.   Tussin [Guaifenesin]     Unaware of this allergy per patient.    Outpatient Encounter Medications as of 09/04/2018  Medication Sig   acetaminophen (TYLENOL) 325 MG tablet Take 2 tablets (650 mg total) by mouth every 6 (six) hours as needed for mild pain (or Fever >/= 101).   ALPRAZolam (XANAX)  0.25 MG tablet Take 0.25 mg by mouth daily as needed for anxiety.   amLODipine (NORVASC) 5 MG tablet Take 1 tablet (5 mg total) by mouth daily.   aspirin 81 MG tablet Take 1 tablet (81 mg total) by mouth daily.   Cholecalciferol (VITAMIN D PO) Take 1,000 Units by mouth daily.    glimepiride (AMARYL) 1 MG tablet Take 0.5-1 mg by mouth daily with breakfast.   glucose blood (ONE TOUCH ULTRA TEST) test strip USE TO CHECK BLOOD SUGAR ONCE DAILY AND AS DIRECTED   levothyroxine (SYNTHROID) 50 MCG tablet TAKE 1 TABLET BY MOUTH EVERY DAY FOR THYROID   losartan (COZAAR) 100 MG tablet Take 1 tablet (100 mg total) by mouth daily.   nitroGLYCERIN (NITROSTAT) 0.4 MG SL tablet Place 1 tablet (0.4 mg total) under the tongue every 5 (five) minutes as needed for chest pain.   ONETOUCH DELICA LANCETS 41O MISC Check blood sugar once daily as directed E11.22   simvastatin (ZOCOR) 20 MG tablet TAKE 1 TABLET BY MOUTH EVERY NIGHT AT BEDTIME   temazepam (RESTORIL) 15 MG capsule TAKE ONE CAPSULE BY MOUTH AT BEDTIME AS NEEDED FOR SLEEP   triamcinolone cream (KENALOG) 0.1 % APPLY EXTERNALLY TO RASH TWICE DAILY AS NEEDED   [DISCONTINUED] glimepiride (AMARYL) 1 MG tablet TAKE 1 TABLET BY MOUTH EVERY DAY   No facility-administered encounter medications on file as of 09/04/2018.     Review of Systems:  Review of Systems  Constitutional: Negative for chills, fever and malaise/fatigue.  HENT: Positive for hearing loss. Negative for congestion.   Eyes: Negative for blurred vision.       Glasses  Respiratory: Negative for cough and shortness of breath.   Cardiovascular: Negative for chest pain, palpitations and leg swelling.  Gastrointestinal: Negative for abdominal pain, blood in stool, constipation, diarrhea and melena.  Genitourinary: Negative for dysuria.  Musculoskeletal: Positive for falls. Negative for back pain and joint pain.       2 recent falls--first with SDH; second with concussion  Skin: Negative  for itching and rash.  Neurological: Negative for dizziness, loss of consciousness and headaches.       Chronic bell's palsy  Endo/Heme/Allergies: Bruises/bleeds easily.  Psychiatric/Behavioral: Negative for depression and memory loss. The patient is nervous/anxious. The patient does not have insomnia.     Health Maintenance  Topic Date Due   PNA vac Low Risk Adult (2 of 2 - PPSV23) 03/23/2017   INFLUENZA VACCINE  10/14/2018   FOOT EXAM  10/28/2018   OPHTHALMOLOGY EXAM  01/14/2019   HEMOGLOBIN A1C  02/24/2019   TETANUS/TDAP  03/06/2023    Physical Exam: Vitals:   09/04/18 0922  BP: 130/68  Pulse: 67  Temp: 98 F (36.7 C)  TempSrc: Oral  SpO2: 97%  Weight: 157 lb (71.2 kg)  Height: 5\' 5"  (1.651 m)   Body  mass index is 26.13 kg/m. Physical Exam Vitals signs reviewed.  Constitutional:      General: He is not in acute distress.    Appearance: Normal appearance. He is normal weight. He is not toxic-appearing.  HENT:     Head: Normocephalic and atraumatic.     Ears:     Comments: Hearing aids Cardiovascular:     Rate and Rhythm: Normal rate and regular rhythm.     Pulses: Normal pulses.     Heart sounds: Normal heart sounds.  Pulmonary:     Effort: Pulmonary effort is normal.     Breath sounds: Normal breath sounds.  Musculoskeletal: Normal range of motion.     Right lower leg: No edema.     Left lower leg: No edema.  Skin:    Capillary Refill: Capillary refill takes less than 2 seconds.  Neurological:     Mental Status: He is alert and oriented to person, place, and time. Mental status is at baseline.     Motor: No weakness.     Coordination: Coordination normal.     Gait: Gait normal.     Deep Tendon Reflexes: Reflexes normal.     Comments: Chronic bell's palsy left   Psychiatric:        Mood and Affect: Mood normal.        Behavior: Behavior normal.        Thought Content: Thought content normal.        Judgment: Judgment normal.     Labs  reviewed: Basic Metabolic Panel: Recent Labs    09/19/17 0840  07/09/18 1445 08/25/18 0820 08/29/18 0938  NA 140   < > 138 140 139  K 4.3   < > 4.0 4.6 3.9  CL 106   < > 108 108 106  CO2 25   < > 21* 26 23  GLUCOSE 88   < > 90 80 97  BUN 26*   < > 22 24 23   CREATININE 1.18*   < > 1.08 0.98 1.05  CALCIUM 9.3   < > 9.0 9.0 9.0  TSH 3.07  --   --  4.32  --    < > = values in this interval not displayed.   Liver Function Tests: Recent Labs    09/19/17 0840 08/25/18 0820  AST 15 15  ALT 12 9  BILITOT 0.7 0.6  PROT 6.7 6.1   No results for input(s): LIPASE, AMYLASE in the last 8760 hours. No results for input(s): AMMONIA in the last 8760 hours. CBC: Recent Labs    04/24/18 0802 07/09/18 1445 08/29/18 0938  WBC 6.1 7.3 6.5  NEUTROABS 3,654 4.5 4.2  HGB 15.1 14.4 14.0  HCT 44.2 42.2 42.0  MCV 89.7 90.8 91.3  PLT 188 143* 152   Lipid Panel: Recent Labs    09/19/17 0840 08/25/18 0820  CHOL 147 144  HDL 51 48  LDLCALC 80 82  TRIG 77 61  CHOLHDL 2.9 3.0   Lab Results  Component Value Date   HGBA1C 5.7 (H) 08/25/2018    Procedures since last visit: Ct Head Wo Contrast  Result Date: 08/29/2018 CLINICAL DATA:  The patient suffered a blow to the left head with a laceration 09/26/2018. EXAM: CT HEAD WITHOUT CONTRAST TECHNIQUE: Contiguous axial images were obtained from the base of the skull through the vertex without intravenous contrast. COMPARISON:  Head CT scan 07/10/2018. FINDINGS: Brain: No evidence of acute infarction, hemorrhage, hydrocephalus, extra-axial collection or mass lesion/mass  effect. Atrophy and chronic microvascular ischemic change noted. Vascular: No hyperdense vessel or unexpected calcification. Skull: Intact.  No focal lesion. Sinuses/Orbits: Visualized left maxillary sinus is almost completely opacified, unchanged. Scattered ethmoid air cell disease is also unchanged. Other: None. IMPRESSION: No acute abnormality. No change in left maxillary sinus  and scattered ethmoid air cell disease. Electronically Signed   By: Inge Rise M.D.   On: 08/29/2018 11:23    Assessment/Plan 1. Essential hypertension -bp at goal with current therapy, cont same regimen of losartan and amlodipine; no dizziness  2. Type 2 diabetes mellitus with stage 3 chronic kidney disease, without long-term current use of insulin (HCC) -renal function back to baseline and hba1c down into prediabetic range again -cont amaryl 0.5mg  qod as he's taking due to some hypoglycemia with full tablet  3. Other specified hypothyroidism -cont current levothyroxine therapy--TSH at goal  4. Bell's palsy -chronic from prior shingles infection years ago  93. Hyperlipidemia, unspecified hyperlipidemia type -LDL less than 100 with simvastatin therapy -he's doing fine at 89 and not bothered by the medication so continue at this point  6. Fall, sequela -2nd fall--both mechanical; counseled on moving slower and not bending over like he did this last time -given fall risk prevention information -neuro exam normal after concussion -CT in ED was negative  Labs/tests ordered:  No new Next appt:  6 mos med mgt  Leavy Heatherly L. Stephonie Wilcoxen, D.O. [facility] [address], [place] [address] Cell Phone (Mon-Fri 8am-5pm):  [phone] On Call:  [phone] & follow prompts after 5pm & weekends Office Phone:  [phone] Office Fax:  [phone]

## 2018-09-04 NOTE — Patient Instructions (Signed)
All of your labs are excellent!  Keep up the good work!  Slow down just a bit so you do not fall again.  Try not to bend over to do things like kill bugs.     Fall Prevention in the Home, Adult Falls can cause injuries and can affect people from all age groups. There are many simple things that you can do to make your home safe and to help prevent falls. Ask for help when making these changes, if needed. What actions can I take to prevent falls? General instructions  Use good lighting in all rooms. Replace any light bulbs that burn out.  Turn on lights if it is dark. Use night-lights.  Place frequently used items in easy-to-reach places. Lower the shelves around your home if necessary.  Set up furniture so that there are clear paths around it. Avoid moving your furniture around.  Remove throw rugs and other tripping hazards from the floor.  Avoid walking on wet floors.  Fix any uneven floor surfaces.  Add color or contrast paint or tape to grab bars and handrails in your home. Place contrasting color strips on the first and last steps of stairways.  When you use a stepladder, make sure that it is completely opened and that the sides are firmly locked. Have someone hold the ladder while you are using it. Do not climb a closed stepladder.  Be aware of any and all pets. What can I do in the bathroom?      Keep the floor dry. Immediately clean up any water that spills onto the floor.  Remove soap buildup in the tub or shower on a regular basis.  Use non-skid mats or decals on the floor of the tub or shower.  Attach bath mats securely with double-sided, non-slip rug tape.  If you need to sit down while you are in the shower, use a plastic, non-slip stool.  Install grab bars by the toilet and in the tub and shower. Do not use towel bars as grab bars. What can I do in the bedroom?  Make sure that a bedside light is easy to reach.  Do not use oversized bedding that drapes  onto the floor.  Have a firm chair that has side arms to use for getting dressed. What can I do in the kitchen?  Clean up any spills right away.  If you need to reach for something above you, use a sturdy step stool that has a grab bar.  Keep electrical cables out of the way.  Do not use floor polish or wax that makes floors slippery. If you must use wax, make sure that it is non-skid floor wax. What can I do in the stairways?  Do not leave any items on the stairs.  Make sure that you have a light switch at the top of the stairs and the bottom of the stairs. Have them installed if you do not have them.  Make sure that there are handrails on both sides of the stairs. Fix handrails that are broken or loose. Make sure that handrails are as long as the stairways.  Install non-slip stair treads on all stairs in your home.  Avoid having throw rugs at the top or bottom of stairways, or secure the rugs with carpet tape to prevent them from moving.  Choose a carpet design that does not hide the edge of steps on the stairway.  Check any carpeting to make sure that it is firmly attached  to the stairs. Fix any carpet that is loose or worn. What can I do on the outside of my home?  Use bright outdoor lighting.  Regularly repair the edges of walkways and driveways and fix any cracks.  Remove high doorway thresholds.  Trim any shrubbery on the main path into your home.  Regularly check that handrails are securely fastened and in good repair. Both sides of any steps should have handrails.  Install guardrails along the edges of any raised decks or porches.  Clear walkways of debris and clutter, including tools and rocks.  Have leaves, snow, and ice cleared regularly.  Use sand or salt on walkways during winter months.  In the garage, clean up any spills right away, including grease or oil spills. What other actions can I take?  Wear closed-toe shoes that fit well and support your  feet. Wear shoes that have rubber soles or low heels.  Use mobility aids as needed, such as canes, walkers, scooters, and crutches.  Review your medicines with your health care provider. Some medicines can cause dizziness or changes in blood pressure, which increase your risk of falling. Talk with your health care provider about other ways that you can decrease your risk of falls. This may include working with a physical therapist or trainer to improve your strength, balance, and endurance. Where to find more information  Centers for Disease Control and Prevention, STEADI: WebmailGuide.co.za  [employer] on Aging: BrainJudge.co.uk Contact a health care provider if:  You are afraid of falling at home.  You feel weak, drowsy, or dizzy at home.  You fall at home. Summary  There are many simple things that you can do to make your home safe and to help prevent falls.  Ways to make your home safe include removing tripping hazards and installing grab bars in the bathroom.  Ask for help when making these changes in your home. This information is not intended to replace advice given to you by your health care provider. Make sure you discuss any questions you have with your health care provider. Document Released: 02/19/2002 Document Revised: 10/14/2016 Document Reviewed: 10/14/2016 Elsevier Interactive Patient Education  2019 [employer].

## 2018-09-25 ENCOUNTER — Ambulatory Visit: Payer: Self-pay

## 2018-09-25 ENCOUNTER — Encounter: Payer: Self-pay | Admitting: Family

## 2018-09-25 ENCOUNTER — Other Ambulatory Visit: Payer: Self-pay | Admitting: Internal Medicine

## 2018-09-25 NOTE — Telephone Encounter (Signed)
Should pt be on plain losartan? Or Hyzaar? Please advise

## 2018-09-25 NOTE — Telephone Encounter (Signed)
Patient is only on losartan.  Not hyzaar (losartan/hctz).  I had stopped the diuretic part due to his falls and risk for dehydration.

## 2018-09-29 ENCOUNTER — Other Ambulatory Visit: Payer: Self-pay | Admitting: Internal Medicine

## 2018-09-29 DIAGNOSIS — G4709 Other insomnia: Secondary | ICD-10-CM

## 2018-09-29 NOTE — Telephone Encounter (Signed)
Verified patient's pharmacy [facility], verified patients last refill in [place] database last refill 08/31/2018 for 30 tabs. Next appointment is 03/12/2019 with Dr. Mariea Clonts and last appointment was 09/04/2018.

## 2018-10-03 ENCOUNTER — Other Ambulatory Visit: Payer: Self-pay

## 2018-10-03 NOTE — Patient Outreach (Signed)
[facility]) Care Management  10/03/2018  [name] [birth year] [phone]   Medication Adherence call to Mrs. Bobby Jensen HIPPA Compliant Voice message left with a call back number. [name] is showing past due on Glimepiride 1 mg under [place].   [facility] Management Direct Dial [phone]  Fax [phone] [name]

## 2018-10-05 ENCOUNTER — Other Ambulatory Visit (HOSPITAL_COMMUNITY): Payer: Self-pay | Admitting: Neurological Surgery

## 2018-10-05 ENCOUNTER — Other Ambulatory Visit: Payer: Self-pay | Admitting: Neurological Surgery

## 2018-10-05 DIAGNOSIS — S065X9A Traumatic subdural hemorrhage with loss of consciousness of unspecified duration, initial encounter: Secondary | ICD-10-CM

## 2018-10-05 DIAGNOSIS — S065XAA Traumatic subdural hemorrhage with loss of consciousness status unknown, initial encounter: Secondary | ICD-10-CM

## 2018-10-11 ENCOUNTER — Other Ambulatory Visit: Payer: Self-pay

## 2018-10-11 ENCOUNTER — Ambulatory Visit (HOSPITAL_COMMUNITY)
Admission: RE | Admit: 2018-10-11 | Discharge: 2018-10-11 | Disposition: A | Discharge status: Home / Self Care | Payer: Medicare Other | Source: Ambulatory Visit | Attending: Neurological Surgery | Admitting: Neurological Surgery

## 2018-10-11 ENCOUNTER — Other Ambulatory Visit: Payer: Self-pay | Admitting: Internal Medicine

## 2018-10-11 DIAGNOSIS — S065XAA Traumatic subdural hemorrhage with loss of consciousness status unknown, initial encounter: Secondary | ICD-10-CM

## 2018-10-11 DIAGNOSIS — S065X9A Traumatic subdural hemorrhage with loss of consciousness of unspecified duration, initial encounter: Secondary | ICD-10-CM | POA: Insufficient documentation

## 2018-10-11 DIAGNOSIS — R42 Dizziness and giddiness: Secondary | ICD-10-CM | POA: Diagnosis not present

## 2018-10-11 NOTE — Telephone Encounter (Signed)
Last filled 07/13/18

## 2018-10-19 DIAGNOSIS — S065X9A Traumatic subdural hemorrhage with loss of consciousness of unspecified duration, initial encounter: Secondary | ICD-10-CM | POA: Diagnosis not present

## 2018-10-20 ENCOUNTER — Telehealth: Payer: Self-pay | Admitting: *Deleted

## 2018-10-20 NOTE — Telephone Encounter (Signed)
Spoke with patient and he is declining outpatient physical therapy, pt states he is almost 65 and doesn't want to do physical therapy. Per pt he wanted Dr. Mariea Clonts to handle this??? I advised that Dr. Mariea Clonts was suggesting physical therapy and he wanted to know why Dr. Mariea Clonts couldn't do physical therapy.

## 2018-10-20 NOTE — Telephone Encounter (Signed)
I reviewed the CT scan.  It is listed as being done for "Initial evaluation for acute intermittent dizziness, double vision, hallucinations. Status post 2 falls"  I didn't know he was hallucinating and having double vision.  I knew about the falls, of course.  The CT shows some sinus congestion and notes that his ears appear normal.  It sounds like he's suggesting he has a positional vertigo problem which is usually helped most with physical therapy.  Is he agreeable to outpatient physical therapy?

## 2018-10-20 NOTE — Telephone Encounter (Signed)
LMOM to return call.

## 2018-10-20 NOTE — Telephone Encounter (Signed)
Pt calling stating that Dr. Zada Finders did a CT scan and suggested pt has a " inner ear" problem and stated pt needs to see ENT or if Dr. Mariea Clonts can treat this he can follow-up with her. Pt asking what should he do? Please advise

## 2018-10-20 NOTE — Telephone Encounter (Signed)
Patient declines, he will discuss with Dr. Mariea Clonts.

## 2018-10-20 NOTE — Telephone Encounter (Signed)
The other option, I suppose, would be for [place] to send him the vertigo exercises in the mail for benign positional vertigo.  He would need a family member to work with him to do them--this would help resolve the inner ear problem.

## 2018-10-27 ENCOUNTER — Other Ambulatory Visit: Payer: Self-pay | Admitting: Internal Medicine

## 2018-11-29 ENCOUNTER — Telehealth: Payer: Self-pay | Admitting: Internal Medicine

## 2018-11-29 DIAGNOSIS — R42 Dizziness and giddiness: Secondary | ICD-10-CM

## 2018-11-29 NOTE — Telephone Encounter (Signed)
Pt called bc he's still having dizziness & he's concerned that it hasn't gotten better.  Dr Mariea Clonts suggested possibly some therapy to help with vertigo or seeing an ENT. He is now open to that bc he has been driving.   He's noticed what triggers dizziness & his head from feeling "funny" is too stay still & talk quietly. If he raises his voice or anyone else speaks loudly will bring dizziness back on.  Please call to advise, Thanks  Vilinda Blanks

## 2018-11-29 NOTE — Telephone Encounter (Signed)
Because of the unusual aspect that it get worse when he talks loudly and the persistence of symptoms, I think an ENT evaluation is appropriate.  If nothing is found, then PT for vertigo is the next step.

## 2018-11-30 NOTE — Telephone Encounter (Signed)
Spoke with patient. Patient in agreement with ENT referral. Patient would like for Dr.Reed to specify if she a recommended ENT in the referral   Referral pending for completion by Gayland Curry, DO

## 2018-12-05 ENCOUNTER — Telehealth: Payer: Self-pay

## 2018-12-05 DIAGNOSIS — E038 Other specified hypothyroidism: Secondary | ICD-10-CM

## 2018-12-05 DIAGNOSIS — I10 Essential (primary) hypertension: Secondary | ICD-10-CM

## 2018-12-05 DIAGNOSIS — E1122 Type 2 diabetes mellitus with diabetic chronic kidney disease: Secondary | ICD-10-CM

## 2018-12-05 DIAGNOSIS — N183 Chronic kidney disease, stage 3 unspecified: Secondary | ICD-10-CM

## 2018-12-05 DIAGNOSIS — E785 Hyperlipidemia, unspecified: Secondary | ICD-10-CM

## 2018-12-05 NOTE — Telephone Encounter (Signed)
I ordered some labs for him now.  He will need an official lab appointment for the week before (like 12/21).  I have already requested to be off the day he is scheduled but I guess my schedule is not yet blocked--so we'll need to also reschedule his appt.

## 2018-12-05 NOTE — Telephone Encounter (Signed)
Bobby Jensen called in reference to his ENT referral.  I spoke with Lattie Haw.  A referral was "dropped" to Dr. Benjamine Mola on 11/30/18. This information was provided to the patient, along with Dr. Deeann Saint phone number.  He also inquired about his next appointment.  He has an appointment scheduled for 03/12/19.  He states he does not have an appointment for labs.  I stated there were no orders and your last note does not mention labs.  He is insistent that he needs labs a week before his 03/12/19 appointment.

## 2018-12-12 DIAGNOSIS — H8111 Benign paroxysmal vertigo, right ear: Secondary | ICD-10-CM | POA: Diagnosis not present

## 2018-12-12 DIAGNOSIS — R42 Dizziness and giddiness: Secondary | ICD-10-CM | POA: Diagnosis not present

## 2018-12-12 DIAGNOSIS — H903 Sensorineural hearing loss, bilateral: Secondary | ICD-10-CM | POA: Diagnosis not present

## 2018-12-19 ENCOUNTER — Other Ambulatory Visit: Payer: Self-pay | Admitting: *Deleted

## 2018-12-19 DIAGNOSIS — E1122 Type 2 diabetes mellitus with diabetic chronic kidney disease: Secondary | ICD-10-CM

## 2018-12-19 MED ORDER — ONETOUCH DELICA LANCETS 33G MISC: 5 refills | Status: DC

## 2018-12-19 MED ORDER — GLUCOSE BLOOD VI STRP: ORAL_STRIP | 11 refills | Status: DC

## 2018-12-20 DIAGNOSIS — H8111 Benign paroxysmal vertigo, right ear: Secondary | ICD-10-CM | POA: Diagnosis not present

## 2018-12-20 DIAGNOSIS — R42 Dizziness and giddiness: Secondary | ICD-10-CM | POA: Diagnosis not present

## 2018-12-21 ENCOUNTER — Telehealth: Payer: Medicare Other | Admitting: Neurology

## 2018-12-21 NOTE — Telephone Encounter (Signed)
Patient was called with a date for labs and rescheduled his appt with Dr.  Mariea Clonts.

## 2018-12-29 ENCOUNTER — Other Ambulatory Visit: Payer: Self-pay

## 2018-12-29 MED ORDER — LEVOTHYROXINE SODIUM 50 MCG PO TABS: ORAL_TABLET | ORAL | 1 refills | Status: DC

## 2019-01-15 ENCOUNTER — Other Ambulatory Visit: Payer: Self-pay | Admitting: *Deleted

## 2019-01-15 MED ORDER — SIMVASTATIN 20 MG PO TABS: 20.0000 mg | ORAL_TABLET | Freq: Every day | ORAL | 1 refills | Status: DC

## 2019-01-15 NOTE — Telephone Encounter (Signed)
[facility] 

## 2019-03-12 ENCOUNTER — Ambulatory Visit: Payer: Medicare Other | Admitting: Internal Medicine

## 2019-03-12 ENCOUNTER — Other Ambulatory Visit: Payer: Medicare Other

## 2019-03-12 ENCOUNTER — Other Ambulatory Visit: Payer: Self-pay

## 2019-03-12 DIAGNOSIS — N183 Chronic kidney disease, stage 3 unspecified: Secondary | ICD-10-CM | POA: Diagnosis not present

## 2019-03-12 DIAGNOSIS — E1122 Type 2 diabetes mellitus with diabetic chronic kidney disease: Secondary | ICD-10-CM | POA: Diagnosis not present

## 2019-03-12 DIAGNOSIS — E038 Other specified hypothyroidism: Secondary | ICD-10-CM

## 2019-03-12 DIAGNOSIS — I10 Essential (primary) hypertension: Secondary | ICD-10-CM | POA: Diagnosis not present

## 2019-03-12 DIAGNOSIS — E785 Hyperlipidemia, unspecified: Secondary | ICD-10-CM | POA: Diagnosis not present

## 2019-03-13 LAB — COMPLETE METABOLIC PANEL WITH GFR
AG Ratio: 1.8 (calc) (ref 1.0–2.5)
ALT: 10 U/L (ref 9–46)
AST: 14 U/L (ref 10–35)
Albumin: 4.1 g/dL (ref 3.6–5.1)
Alkaline phosphatase (APISO): 65 U/L (ref 35–144)
BUN/Creatinine Ratio: 21 (calc) (ref 6–22)
BUN: 26 mg/dL — ABNORMAL HIGH (ref 7–25)
CO2: 26 mmol/L (ref 20–32)
Calcium: 9.2 mg/dL (ref 8.6–10.3)
Chloride: 108 mmol/L (ref 98–110)
Creat: 1.21 mg/dL — ABNORMAL HIGH (ref 0.70–1.11)
GFR, Est African American: 61 mL/min/{1.73_m2} (ref >=60)
GFR, Est Non African American: 53 mL/min/{1.73_m2} — ABNORMAL LOW (ref >=60)
Globulin: 2.3 g/dL (calc) (ref 1.9–3.7)
Glucose, Bld: 94 mg/dL (ref 65–99)
Potassium: 4.5 mmol/L (ref 3.5–5.3)
Sodium: 142 mmol/L (ref 135–146)
Total Bilirubin: 0.6 mg/dL (ref 0.2–1.2)
Total Protein: 6.4 g/dL (ref 6.1–8.1)

## 2019-03-13 LAB — CBC WITH DIFFERENTIAL/PLATELET
Absolute Monocytes: 534 cells/uL (ref 200–950)
Basophils Absolute: 30 cells/uL (ref 0–200)
Basophils Relative: 0.5 %
Eosinophils Absolute: 360 cells/uL (ref 15–500)
Eosinophils Relative: 6 %
HCT: 41.2 % (ref 38.5–50.0)
Hemoglobin: 13.9 g/dL (ref 13.2–17.1)
Lymphs Abs: 1392 cells/uL (ref 850–3900)
MCH: 30.6 pg (ref 27.0–33.0)
MCHC: 33.7 g/dL (ref 32.0–36.0)
MCV: 90.7 fL (ref 80.0–100.0)
MPV: 10.7 fL (ref 7.5–12.5)
Monocytes Relative: 8.9 %
Neutro Abs: 3684 cells/uL (ref 1500–7800)
Neutrophils Relative %: 61.4 %
Platelets: 164 10*3/uL (ref 140–400)
RBC: 4.54 10*6/uL (ref 4.20–5.80)
RDW: 12.8 % (ref 11.0–15.0)
Total Lymphocyte: 23.2 %
WBC: 6 10*3/uL (ref 3.8–10.8)

## 2019-03-13 LAB — LIPID PANEL
Cholesterol: 140 mg/dL (ref <200)
HDL: 51 mg/dL (ref >=40)
LDL Cholesterol (Calc): 76 mg/dL (calc)
Non-HDL Cholesterol (Calc): 89 mg/dL (calc) (ref <130)
Total CHOL/HDL Ratio: 2.7 (calc) (ref <5.0)
Triglycerides: 54 mg/dL (ref <150)

## 2019-03-13 LAB — HEMOGLOBIN A1C
Hgb A1c MFr Bld: 6 % of total Hgb — ABNORMAL HIGH (ref <5.7)
Mean Plasma Glucose: 126 (calc)
eAG (mmol/L): 7 (calc)

## 2019-03-13 LAB — TSH: TSH: 4.58 mIU/L — ABNORMAL HIGH (ref 0.40–4.50)

## 2019-03-15 ENCOUNTER — Ambulatory Visit: Payer: Medicare Other | Admitting: Internal Medicine

## 2019-03-19 ENCOUNTER — Ambulatory Visit (INDEPENDENT_AMBULATORY_CARE_PROVIDER_SITE_OTHER): Payer: Medicare Other | Admitting: Internal Medicine

## 2019-03-19 ENCOUNTER — Other Ambulatory Visit: Payer: Self-pay

## 2019-03-19 ENCOUNTER — Encounter: Payer: Self-pay | Admitting: Internal Medicine

## 2019-03-19 VITALS — BP 122/68 | HR 52 | Temp 97.6°F | Ht 65.0 in | Wt 157.0 lb

## 2019-03-19 DIAGNOSIS — Z794 Long term (current) use of insulin: Secondary | ICD-10-CM

## 2019-03-19 DIAGNOSIS — E1169 Type 2 diabetes mellitus with other specified complication: Secondary | ICD-10-CM | POA: Diagnosis not present

## 2019-03-19 DIAGNOSIS — E038 Other specified hypothyroidism: Secondary | ICD-10-CM | POA: Diagnosis not present

## 2019-03-19 DIAGNOSIS — I10 Essential (primary) hypertension: Secondary | ICD-10-CM | POA: Diagnosis not present

## 2019-03-19 DIAGNOSIS — N182 Chronic kidney disease, stage 2 (mild): Secondary | ICD-10-CM | POA: Diagnosis not present

## 2019-03-19 DIAGNOSIS — Z7189 Other specified counseling: Secondary | ICD-10-CM | POA: Diagnosis not present

## 2019-03-19 DIAGNOSIS — E0822 Diabetes mellitus due to underlying condition with diabetic chronic kidney disease: Secondary | ICD-10-CM | POA: Diagnosis not present

## 2019-03-19 DIAGNOSIS — F411 Generalized anxiety disorder: Secondary | ICD-10-CM

## 2019-03-19 DIAGNOSIS — E785 Hyperlipidemia, unspecified: Secondary | ICD-10-CM

## 2019-03-19 DIAGNOSIS — R42 Dizziness and giddiness: Secondary | ICD-10-CM

## 2019-03-19 NOTE — Progress Notes (Signed)
Location:  [facility] clinic Provider:  Jamye Balicki L. Mariea Clonts, D.O., C.M.D.  Code Status: DNR discussed and completed today--he does not want to be kept alive by artificial means  Goals of Care:  Advanced Directives 08/29/2018  Does Patient Have a Medical Advance Directive? No  Type of Advance Directive -  Copy of [place] in Chart? -  Would patient like information on creating a medical advance directive? No - Patient declined   Chief Complaint  Patient presents with  . Medical Management of Chronic Issues    71mth follow-up    HPI: Patient is a 86 y.o. male seen today for medical management of chronic diseases.    Talks about Christmas not being the same amid covid.  Was unable to eat his meal with his family.  They saw one another in a socially distant fashion at a separate time.  He misses his 57 yo sister who is in [place] alone.  He has not fallen again.  After seeing ENT about vertigo, they did the maneuvers with him and it resolved.    Reviewed labs with him:  hba1c in prediabetic range at 6.  Bad cholesterol at 76.  He checks his fasting sugar each am--85-100.  He is no longer on amaryl b/c he was having lows which certainly seems appropriate at this point and with that a1c at 86yo.  He does quite a few stairs probably 20 times per day.    CKD:  Mild with GFR 53.  Discussed hydration and nsaid avoidance.  He takes tylenol if he has a headache.    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  Also uses his alprazolam when anxious--takes 1/2 to 1 pill per day.    Past Medical History:  Diagnosis Date  . Bladder neck obstruction 03/28/2008  . Closed rib fracture   . Contact with or exposure to tuberculosis 03/15/1992  . Elevated prostate specific antigen (PSA) 03/15/1998  . Gastric ulcer, unspecified as acute or chronic, without mention of hemorrhage, perforation, or obstruction 03/15/1993  .  Hypertension 07/08/1998  . Hypertrophy of prostate without urinary obstruction and other lower urinary tract symptoms (LUTS) 03/15/1989  . 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 Inguinal hernia without mention of obstruction or gangrene, unilateral or unspecified, (not specified as recurrent) 11/20/2008  . Insomnia, unspecified 11/01/2003  . Kidney calculi 08/20/2009  . Other and unspecified hyperlipidemia 12/23/2004  . Other malaise and fatigue 12/23/2004  . Pain in joint, pelvic region and thigh 12/17/2009  . Pain in joint, shoulder region 11/01/2003  . Peptic ulcer, unspecified site, unspecified as acute or chronic, without mention of hemorrhage, perforation, or obstruction 06/25/2003  . Personal history of malaria 03/16/1939  . Right rotator cuff tear 07/10/2018   Chronic per imaging 4/20  . Right shoulder injury 07/10/2018  . Rosacea 08/20/2009  . SDH (subdural hematoma) ([place])   . Type II or unspecified type diabetes mellitus without mention of complication, not stated as uncontrolled 07/15/2006  . Unspecified constipation 08/05/2010  . Unspecified hypothyroidism 12/17/2009    Past Surgical History:  Procedure Laterality Date  . APPENDECTOMY  1956  . HEMORROIDECTOMY  06/2002   Dr. Lennie Hummer  . HERNIA REPAIR Bilateral 11/2008   inguinal  . PILONIDAL CYST EXCISION  1963  . PROSTATE SURGERY    . TOTAL HIP ARTHROPLASTY Right 07/10/2009   Dr. Telford Nab    No Known Allergies  Outpatient Encounter Medications as of 03/19/2019  Medication Sig  . acetaminophen (TYLENOL) 325 MG tablet Take 2 tablets (650 mg total) by mouth every 6 (six) hours as needed for mild pain (or Fever >/= 101).  Marland Kitchen ALPRAZolam (XANAX) 0.25 MG tablet TAKE 1 TABLET BY MOUTH THREE TIMES DAILY  . amLODipine (NORVASC) 5 MG tablet Take 1 tablet (5 mg total) by mouth daily.  Marland Kitchen aspirin 81 MG tablet Take 1 tablet (81 mg total) by mouth daily.  . Cholecalciferol (VITAMIN D PO) Take 1,000 Units by mouth daily.   Marland Kitchen  glucose blood (ONE TOUCH ULTRA TEST) test strip USE TO CHECK BLOOD SUGAR ONCE DAILY AND AS DIRECTED  . levothyroxine (SYNTHROID) 50 MCG tablet TAKE 1 TABLET BY MOUTH EVERY DAY FOR THYROID  . losartan (COZAAR) 100 MG tablet TAKE 1 TABLET(100 MG) BY MOUTH DAILY  . nitroGLYCERIN (NITROSTAT) 0.4 MG SL tablet Place 1 tablet (0.4 mg total) under the tongue every 5 (five) minutes as needed for chest pain.  Glory Rosebush Delica Lancets 99991111 MISC Check blood sugar once daily as directed E11.22  . simvastatin (ZOCOR) 20 MG tablet Take 1 tablet (20 mg total) by mouth at bedtime.  . temazepam (RESTORIL) 15 MG capsule TAKE ONE CAPSULE BY MOUTH AT BEDTIME AS NEEDED FOR SLEEP  . triamcinolone cream (KENALOG) 0.1 % APPLY EXTERNALLY TO RASH TWICE DAILY AS NEEDED  . [DISCONTINUED] glimepiride (AMARYL) 1 MG tablet Take 0.5 mg by mouth every other day.   No facility-administered encounter medications on file as of 03/19/2019.    Review of Systems:  Review of Systems  Constitutional: Negative for chills and fever.  HENT: Negative for congestion and sore throat.   Eyes: Negative for pain.  Respiratory: Negative for cough and shortness of breath.   Cardiovascular: Negative for chest pain, palpitations and leg swelling.  Gastrointestinal: Negative for abdominal pain, blood in stool, constipation and melena.  Genitourinary: Negative for dysuria.  Musculoskeletal: Negative for falls and joint pain.  Skin: Negative for rash.  Neurological: Negative for dizziness and loss of consciousness.  Endo/Heme/Allergies: Bruises/bleeds easily.  Psychiatric/Behavioral: Negative for depression and memory loss. The patient is nervous/anxious and has insomnia.     Health Maintenance  Topic Date Due  . PNA vac Low Risk Adult (2 of 2 - PPSV23) 03/23/2017  . FOOT EXAM  10/28/2018  . OPHTHALMOLOGY EXAM  01/14/2019  . HEMOGLOBIN A1C  09/10/2019  . TETANUS/TDAP  03/06/2023  . INFLUENZA VACCINE  Completed    Physical Exam: Vitals:    03/19/19 0854  BP: 122/68  Pulse: (!) 52  Temp: 97.6 F (36.4 C)  TempSrc: Oral  SpO2: 94%  Weight: 157 lb (71.2 kg)  Height: 5\' 5"  (1.651 m)   Body mass index is 26.13 kg/m. Physical Exam Vitals reviewed.  Constitutional:      General: He is not in acute distress.    Appearance: Normal appearance. He is not ill-appearing or toxic-appearing.  HENT:     Head: Normocephalic and atraumatic.  Eyes:     Comments: Chronic bell's palsy, some left lid drooping, ears glasses  Cardiovascular:     Rate and Rhythm: Regular rhythm. Bradycardia present.     Pulses: Normal pulses.     Heart sounds: Normal heart sounds.  Pulmonary:     Effort: Pulmonary effort is normal.     Breath sounds: Normal breath sounds.  Abdominal:     General: Bowel sounds are normal.  Musculoskeletal:  General: Normal range of motion.     Right lower leg: No edema.     Left lower leg: No edema.  Skin:    General: Skin is warm and dry.  Neurological:     General: No focal deficit present.     Mental Status: He is alert and oriented to person, place, and time.     Cranial Nerves: Cranial nerve deficit present.     Motor: No weakness.     Gait: Gait normal.     Comments: Bell's palsy    Psychiatric:        Mood and Affect: Mood normal.        Behavior: Behavior normal.        Thought Content: Thought content normal.        Judgment: Judgment normal.     Labs reviewed: Basic Metabolic Panel: Recent Labs    08/25/18 0820 08/29/18 0938 03/12/19 0805  NA 140 139 142  K 4.6 3.9 4.5  CL 108 106 108  CO2 26 23 26   GLUCOSE 80 97 94  BUN 24 23 26*  CREATININE 0.98 1.05 1.21*  CALCIUM 9.0 9.0 9.2  TSH 4.32  --  4.58*   Liver Function Tests: Recent Labs    08/25/18 0820 03/12/19 0805  AST 15 14  ALT 9 10  BILITOT 0.6 0.6  PROT 6.1 6.4   No results for input(s): LIPASE, AMYLASE in the last 8760 hours. No results for input(s): AMMONIA in the last 8760 hours. CBC: Recent Labs     07/09/18 1445 08/29/18 0938 03/12/19 0805  WBC 7.3 6.5 6.0  NEUTROABS 4.5 4.2 3,684  HGB 14.4 14.0 13.9  HCT 42.2 42.0 41.2  MCV 90.8 91.3 90.7  PLT 143* 152 164   Lipid Panel: Recent Labs    08/25/18 0820 03/12/19 0805  CHOL 144 140  HDL 48 51  LDLCALC 82 76  TRIG 61 54  CHOLHDL 3.0 2.7   Lab Results  Component Value Date   HGBA1C 6.0 (H) 03/12/2019    Assessment/Plan 1. Diabetes mellitus due to underlying condition with stage 2 chronic kidney disease, with long-term current use of insulin (HCC) -is very good about watching his diet and checking his fasting glucose -he was having lows on amaryl even at a 1/2 tab of the lowest dose so he's stopped it -control remains excellent especially at 86 yo Lab Results  Component Value Date   HGBA1C 6.0 (H) 03/12/2019  -cont same routine at this point  2. Other specified hypothyroidism -TSH borderline when checked today, will recheck at his next visit for cpe in 6 mos  3. Hyperlipidemia associated with type 2 diabetes mellitus ([place]) -continue low dose zocor as tolerating his meds fine and control good  4. Essential hypertension -bp well controlled  5. Vertigo -resolved after therapy  6. Anxiety state -remains at times, continues to take restoril chronically due to some sleep cycle problems and subsequent dependence on med and uses very low dose of prn xanax 1/2 to one per day--not ideal, but attempts to wean have not been a success  7. ACP (advance care planning) - discussed with him today--he would not want to be kept alive on machines -discussed with him that CPR attempts in older adults are rarely successful and quality of life is greatly impacted, many wind up on ventilators--he does not want this -if he's that sick, he'd like for [place] to allow him to pass away naturally - DNR (Do Not  Resuscitate) goldenrod form was completed for him today and sent to scan into vynca, order entered, original given to him to keep with  his medications at home for easy accessibility -he has previously discussed this wish with his family, he tells me -16 mins spent on ACP today  Labs/tests ordered:  Will plan to do tsh with labs before CPE Next appt:  6 mos for CPE  Mima Cranmore L. Glenola Wheat, D.O. [facility] [address], [place] [address] Cell Phone (Mon-Fri 8am-5pm):  [phone] On Call:  [phone] & follow prompts after 5pm & weekends Office Phone:  [phone] Office Fax:  [phone]

## 2019-03-28 ENCOUNTER — Other Ambulatory Visit: Payer: Self-pay | Admitting: Internal Medicine

## 2019-03-28 DIAGNOSIS — G4709 Other insomnia: Secondary | ICD-10-CM

## 2019-04-13 ENCOUNTER — Ambulatory Visit: Payer: Medicare Other

## 2019-04-22 ENCOUNTER — Ambulatory Visit: Payer: Medicare Other | Attending: Internal Medicine

## 2019-04-27 ENCOUNTER — Other Ambulatory Visit: Payer: Self-pay | Admitting: Internal Medicine

## 2019-04-30 ENCOUNTER — Ambulatory Visit: Payer: Medicare Other

## 2019-05-09 ENCOUNTER — Other Ambulatory Visit: Payer: Self-pay | Admitting: Internal Medicine

## 2019-06-25 ENCOUNTER — Other Ambulatory Visit: Payer: Self-pay | Admitting: Internal Medicine

## 2019-06-25 DIAGNOSIS — G4709 Other insomnia: Secondary | ICD-10-CM

## 2019-06-28 DIAGNOSIS — D224 Melanocytic nevi of scalp and neck: Secondary | ICD-10-CM | POA: Diagnosis not present

## 2019-06-28 DIAGNOSIS — D225 Melanocytic nevi of trunk: Secondary | ICD-10-CM | POA: Diagnosis not present

## 2019-06-28 DIAGNOSIS — D1801 Hemangioma of skin and subcutaneous tissue: Secondary | ICD-10-CM | POA: Diagnosis not present

## 2019-06-28 DIAGNOSIS — L720 Epidermal cyst: Secondary | ICD-10-CM | POA: Diagnosis not present

## 2019-06-28 DIAGNOSIS — L821 Other seborrheic keratosis: Secondary | ICD-10-CM | POA: Diagnosis not present

## 2019-08-02 ENCOUNTER — Other Ambulatory Visit: Payer: Self-pay | Admitting: Internal Medicine

## 2019-08-02 NOTE — Telephone Encounter (Signed)
rx sent to pharmacy by e-script  

## 2019-08-16 ENCOUNTER — Other Ambulatory Visit: Payer: Self-pay | Admitting: Internal Medicine

## 2019-08-16 DIAGNOSIS — I10 Essential (primary) hypertension: Secondary | ICD-10-CM

## 2019-09-14 ENCOUNTER — Other Ambulatory Visit: Payer: Self-pay

## 2019-09-14 ENCOUNTER — Other Ambulatory Visit: Payer: Medicare Other

## 2019-09-14 DIAGNOSIS — I10 Essential (primary) hypertension: Secondary | ICD-10-CM | POA: Diagnosis not present

## 2019-09-14 DIAGNOSIS — N182 Chronic kidney disease, stage 2 (mild): Secondary | ICD-10-CM | POA: Diagnosis not present

## 2019-09-14 DIAGNOSIS — E038 Other specified hypothyroidism: Secondary | ICD-10-CM | POA: Diagnosis not present

## 2019-09-14 DIAGNOSIS — Z794 Long term (current) use of insulin: Secondary | ICD-10-CM

## 2019-09-14 DIAGNOSIS — R42 Dizziness and giddiness: Secondary | ICD-10-CM

## 2019-09-14 DIAGNOSIS — F411 Generalized anxiety disorder: Secondary | ICD-10-CM

## 2019-09-15 LAB — BASIC METABOLIC PANEL
BUN: 22 mg/dL (ref 7–25)
CO2: 25 mmol/L (ref 20–32)
Calcium: 9.2 mg/dL (ref 8.6–10.3)
Chloride: 106 mmol/L (ref 98–110)
Creat: 1 mg/dL (ref 0.70–1.11)
Glucose, Bld: 92 mg/dL (ref 65–99)
Potassium: 4 mmol/L (ref 3.5–5.3)
Sodium: 138 mmol/L (ref 135–146)

## 2019-09-15 LAB — HEMOGLOBIN A1C
Hgb A1c MFr Bld: 5.9 % of total Hgb — ABNORMAL HIGH (ref <5.7)
Mean Plasma Glucose: 123 (calc)
eAG (mmol/L): 6.8 (calc)

## 2019-09-15 LAB — TSH: TSH: 7.71 mIU/L — ABNORMAL HIGH (ref 0.40–4.50)

## 2019-09-16 NOTE — Progress Notes (Signed)
Kidney function looks much better this time :) Thyroid remains abnormal--TSH too high meaning he's not getting enough levothyroxine.  We'll have to adjust it at his appt. Sugar average is down to 6.9 from 6 last time which is good as long as his sugar is not dropping low--we'll discuss at his visit.

## 2019-09-20 ENCOUNTER — Encounter: Payer: Self-pay | Admitting: Internal Medicine

## 2019-09-20 ENCOUNTER — Other Ambulatory Visit: Payer: Self-pay

## 2019-09-20 ENCOUNTER — Ambulatory Visit (INDEPENDENT_AMBULATORY_CARE_PROVIDER_SITE_OTHER): Payer: Medicare Other | Admitting: Internal Medicine

## 2019-09-20 VITALS — BP 138/62 | HR 54 | Temp 97.8°F | Ht 65.0 in | Wt 156.5 lb

## 2019-09-20 DIAGNOSIS — E785 Hyperlipidemia, unspecified: Secondary | ICD-10-CM

## 2019-09-20 DIAGNOSIS — R42 Dizziness and giddiness: Secondary | ICD-10-CM | POA: Diagnosis not present

## 2019-09-20 DIAGNOSIS — E1121 Type 2 diabetes mellitus with diabetic nephropathy: Secondary | ICD-10-CM

## 2019-09-20 DIAGNOSIS — N1831 Chronic kidney disease, stage 3a: Secondary | ICD-10-CM

## 2019-09-20 DIAGNOSIS — G510 Bell's palsy: Secondary | ICD-10-CM

## 2019-09-20 DIAGNOSIS — Z634 Disappearance and death of family member: Secondary | ICD-10-CM | POA: Insufficient documentation

## 2019-09-20 DIAGNOSIS — I10 Essential (primary) hypertension: Secondary | ICD-10-CM

## 2019-09-20 DIAGNOSIS — E1169 Type 2 diabetes mellitus with other specified complication: Secondary | ICD-10-CM

## 2019-09-20 DIAGNOSIS — E1122 Type 2 diabetes mellitus with diabetic chronic kidney disease: Secondary | ICD-10-CM

## 2019-09-20 DIAGNOSIS — E038 Other specified hypothyroidism: Secondary | ICD-10-CM

## 2019-09-20 DIAGNOSIS — F4321 Adjustment disorder with depressed mood: Secondary | ICD-10-CM

## 2019-09-20 MED ORDER — LEVOTHYROXINE SODIUM 75 MCG PO TABS: 75.0000 ug | ORAL_TABLET | Freq: Every day | ORAL | 3 refills | Status: DC

## 2019-09-20 NOTE — Progress Notes (Signed)
Location:  [facility] clinic Provider:  Corneshia Hines L. Mariea Clonts, D.O., C.M.D.  Code Status: DNR Goals of Care:  Advanced Directives 09/20/2019  Does Patient Have a Medical Advance Directive? -  Type of Advance Directive Out of facility DNR (pink MOST or yellow form)  Does patient want to make changes to medical advance directive? No - Patient declined  Copy of [place] in Chart? -  Would patient like information on creating a medical advance directive? -   Chief Complaint  Patient presents with  . Medical Management of Chronic Issues    6 month follow up     HPI: Patient is a 86 y.o. male seen today for medical management of chronic diseases.    His son passed away 2 mos ago in [place].  He'd not been sick.  He was 67. He was their only son.  His two daughters live here in [place].  He'd been to Dr. Benjamine Mola ENT for his vertigo before his son's death.  He had dizziness some afterward and did the technique on his own.  He is fine sitting, but if he starts moving, he has problems.  He tries to be as calm as possible.  His wife cannot cook and he's constantly going out to get food.  He's worried about her and she's not as healthy and the same age as he.  He does still drive very carefully.  Not having any difference with sleep--only has every slept 4-6 hrs.    Says he may miss one thyroid pill in a month, but not regularly.  Still on the 73mcg.    Sugar average is a little better.  Has tunnel sensation of left eat at times.  Had prior shingles  Past Medical History:  Diagnosis Date  . Bladder neck obstruction 03/28/2008  . Closed rib fracture   . Contact with or exposure to tuberculosis 03/15/1992  . Elevated prostate specific antigen (PSA) 03/15/1998  . Gastric ulcer, unspecified as acute or chronic, without mention of hemorrhage, perforation, or obstruction 03/15/1993  . Hypertension 07/08/1998  . Hypertrophy of prostate without urinary obstruction and other lower urinary tract  symptoms (LUTS) 03/15/1989  . 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 Inguinal hernia without mention of obstruction or gangrene, unilateral or unspecified, (not specified as recurrent) 11/20/2008  . Insomnia, unspecified 11/01/2003  . Kidney calculi 08/20/2009  . Other and unspecified hyperlipidemia 12/23/2004  . Other malaise and fatigue 12/23/2004  . Pain in joint, pelvic region and thigh 12/17/2009  . Pain in joint, shoulder region 11/01/2003  . Peptic ulcer, unspecified site, unspecified as acute or chronic, without mention of hemorrhage, perforation, or obstruction 06/25/2003  . Personal history of malaria 03/16/1939  . Right rotator cuff tear 07/10/2018   Chronic per imaging 4/20  . Right shoulder injury 07/10/2018  . Rosacea 08/20/2009  . SDH (subdural hematoma) ([place])   . Type II or unspecified type diabetes mellitus without mention of complication, not stated as uncontrolled 07/15/2006  . Unspecified constipation 08/05/2010  . Unspecified hypothyroidism 12/17/2009    Past Surgical History:  Procedure Laterality Date  . APPENDECTOMY  1956  . HEMORROIDECTOMY  06/2002   Dr. Lennie Hummer  . HERNIA REPAIR Bilateral 11/2008   inguinal  . PILONIDAL CYST EXCISION  1963  . PROSTATE SURGERY    . TOTAL HIP ARTHROPLASTY Right 07/10/2009   Dr. Telford Nab    No Known Allergies  Outpatient Encounter Medications as of 09/20/2019  Medication Sig  . acetaminophen (  TYLENOL) 325 MG tablet Take 2 tablets (650 mg total) by mouth every 6 (six) hours as needed for mild pain (or Fever >/= 101).  Marland Kitchen ALPRAZolam (XANAX) 0.25 MG tablet TAKE 1 TABLET BY MOUTH THREE TIMES DAILY  . amLODipine (NORVASC) 5 MG tablet TAKE 1 TABLET(5 MG) BY MOUTH DAILY  . aspirin 81 MG tablet Take 1 tablet (81 mg total) by mouth daily.  . Cholecalciferol (VITAMIN D PO) Take 1,000 Units by mouth daily.   Marland Kitchen glucose blood (ONE TOUCH ULTRA TEST) test strip USE TO CHECK BLOOD SUGAR ONCE DAILY AND AS DIRECTED  . levothyroxine  (SYNTHROID) 50 MCG tablet TAKE 1 TABLET BY MOUTH EVERY DAY FOR THYROID  . losartan (COZAAR) 100 MG tablet TAKE 1 TABLET(100 MG) BY MOUTH DAILY  . nitroGLYCERIN (NITROSTAT) 0.4 MG SL tablet Place 1 tablet (0.4 mg total) under the tongue every 5 (five) minutes as needed for chest pain.  Glory Rosebush Delica Lancets 93A MISC Check blood sugar once daily as directed E11.22  . simvastatin (ZOCOR) 20 MG tablet TAKE 1 TABLET(20 MG) BY MOUTH AT BEDTIME  . temazepam (RESTORIL) 15 MG capsule TAKE 1 CAPSULE BY MOUTH AT BEDTIME AS NEEDED FOR SLEEP  . triamcinolone cream (KENALOG) 0.1 % APPLY EXTERNALLY TO RASH TWICE DAILY AS NEEDED   No facility-administered encounter medications on file as of 09/20/2019.    Review of Systems:  Review of Systems  Constitutional: Positive for malaise/fatigue. Negative for chills and fever.  HENT: Positive for hearing loss. Negative for congestion and sore throat.   Eyes: Negative for blurred vision.  Respiratory: Negative for cough and shortness of breath.   Cardiovascular: Negative for chest pain, palpitations and leg swelling.  Gastrointestinal: Negative for abdominal pain, blood in stool, diarrhea and melena.  Genitourinary: Negative for dysuria.  Musculoskeletal: Negative for falls and joint pain.  Skin: Negative for itching and rash.  Neurological: Negative for dizziness and loss of consciousness.  Psychiatric/Behavioral: Positive for depression. Negative for memory loss and suicidal ideas. The patient is not nervous/anxious and does not have insomnia.        Grieving loss of his son    Health Maintenance  Topic Date Due  . PNA vac Low Risk Adult (2 of 2 - PPSV23) 03/23/2017  . FOOT EXAM  10/28/2018  . OPHTHALMOLOGY EXAM  11/14/2019 (Originally 01/14/2019)  . INFLUENZA VACCINE  10/14/2019  . HEMOGLOBIN A1C  03/16/2020  . TETANUS/TDAP  03/06/2023  . COVID-19 Vaccine  Completed    Physical Exam: Vitals:   09/20/19 1005  BP: 138/62  Pulse: (!) 54  Temp:  97.8 F (36.6 C)  TempSrc: Temporal  SpO2: 98%  Weight: 156 lb 8 oz (71 kg)  Height: 5\' 5"  (1.651 m)   Body mass index is 26.04 kg/m. Physical Exam Vitals reviewed.  Constitutional:      General: He is not in acute distress.    Appearance: Normal appearance. He is not toxic-appearing.     Comments: Has grown his beard in memory of his son  HENT:     Head: Normocephalic and atraumatic.     Right Ear: External ear normal.     Left Ear: External ear normal.     Ears:     Comments: Left TM with scar tissue, possibly small hole Cardiovascular:     Rate and Rhythm: Normal rate and regular rhythm.     Pulses: Normal pulses.     Heart sounds: Normal heart sounds.  Pulmonary:  Effort: Pulmonary effort is normal.     Breath sounds: Normal breath sounds. No wheezing, rhonchi or rales.  Abdominal:     General: Bowel sounds are normal.  Musculoskeletal:        General: Normal range of motion.     Cervical back: Neck supple.     Right lower leg: No edema.     Left lower leg: No edema.  Skin:    General: Skin is warm and dry.  Neurological:     General: No focal deficit present.     Mental Status: He is alert and oriented to person, place, and time.     Labs reviewed: Basic Metabolic Panel: Recent Labs    03/12/19 0805 09/14/19 0820  NA 142 138  K 4.5 4.0  CL 108 106  CO2 26 25  GLUCOSE 94 92  BUN 26* 22  CREATININE 1.21* 1.00  CALCIUM 9.2 9.2  TSH 4.58* 7.71*   Liver Function Tests: Recent Labs    03/12/19 0805  AST 14  ALT 10  BILITOT 0.6  PROT 6.4   No results for input(s): LIPASE, AMYLASE in the last 8760 hours. No results for input(s): AMMONIA in the last 8760 hours. CBC: Recent Labs    03/12/19 0805  WBC 6.0  NEUTROABS 3,684  HGB 13.9  HCT 41.2  MCV 90.7  PLT 164   Lipid Panel: Recent Labs    03/12/19 0805  CHOL 140  HDL 51  LDLCALC 76  TRIG 54  CHOLHDL 2.7   Lab Results  Component Value Date   HGBA1C 5.9 (H) 09/14/2019     Assessment/Plan 1. Other specified hypothyroidism -will adjust medication and recheck tsh - levothyroxine (SYNTHROID) 75 MCG tablet; Take 1 tablet (75 mcg total) by mouth daily before breakfast.  Dispense: 90 tablet; Refill: 3 - TSH; Future - TSH; Future  3. Type 2 diabetes mellitus with stage 3a chronic kidney disease, without long-term current use of insulin (HCC) -control is excellent, not reporting lows -on ARB, baby asa, zocor, not on any diabetes medications Lab Results  Component Value Date   HGBA1C 5.9 (H) 09/14/2019  - Hemoglobin A1c; Future - CBC with Differential/Platelet; Future - COMPLETE METABOLIC PANEL WITH GFR; Future  4. Essential hypertension -bp well controlled for his advanced age and vertigo difficulty -cont same regimen and monitor -encouraged hydration and slow positional changes  5. Vertigo -cont regular epley maneuver exercises to help with this--no falls since last time reported  6. Hyperlipidemia associated with type 2 diabetes mellitus ([place]) -cont zocor therapy for this  7. Bell's palsy -from shingles -chronic unfortunately for him  8. Grief at loss of child -his son passed away suddenly with pulmonary embolism in his 71s and he is really struggling with this and why he was not the one to die, did offer referral to [facility] grief counseling and offered medication for depression if he desires--declined at this time--may call at any time if needed  Labs/tests ordered:   Lab Orders     TSH     TSH     Hemoglobin A1c     CBC with Differential/Platelet     COMPLETE METABOLIC PANEL WITH GFR  Next appt:  6 mos for CPE, fasting labs before   Eugenio Dollins L. Nevaya Nagele, D.O. [facility] [address], [place] [address] Cell Phone (Mon-Fri 8am-5pm):  [phone] On Call:  [phone] & follow prompts after 5pm & weekends Office Phone:  [phone]  Office Fax:  [phone]

## 2019-09-20 NOTE — Patient Instructions (Signed)
Increase your levothyroxine to 61mcg from 44mcg.    Please call me if you'd like to see a grief counselor or take medication to help your through the grieving process.    Please do your exercises for the vertigo each night as you've been to keep it at [place].

## 2019-09-28 ENCOUNTER — Other Ambulatory Visit: Payer: Self-pay | Admitting: Internal Medicine

## 2019-09-28 DIAGNOSIS — G4709 Other insomnia: Secondary | ICD-10-CM

## 2019-10-12 ENCOUNTER — Telehealth: Payer: Self-pay | Admitting: *Deleted

## 2019-10-12 NOTE — Telephone Encounter (Signed)
Patient called and left message on voicemail, stating he needed to speak with Dr. Mariea Clonts regarding some concerns.    Tried calling patient back, but no answer. Will try again.

## 2019-10-12 NOTE — Telephone Encounter (Signed)
NA

## 2019-11-01 ENCOUNTER — Other Ambulatory Visit: Payer: Self-pay

## 2019-11-01 ENCOUNTER — Other Ambulatory Visit: Payer: Medicare Other

## 2019-11-01 DIAGNOSIS — R42 Dizziness and giddiness: Secondary | ICD-10-CM

## 2019-11-01 DIAGNOSIS — E038 Other specified hypothyroidism: Secondary | ICD-10-CM

## 2019-11-01 LAB — TSH: TSH: 1.78 mIU/L (ref 0.40–4.50)

## 2019-11-02 NOTE — Progress Notes (Signed)
Referral entered to Dr. Delice Lesch.  (What I'd said to him was that if Dr. Lafe Garin from neurosurgery could not help him, then I'd send him to neurology--he does not accept what I've told him).

## 2019-11-02 NOTE — Progress Notes (Signed)
Thyroid has normalized.  He had stopped me in the hall asking about his dizziness b/c he'd called here at the end of last month to ask me and did not hear back.  When I looked through the phone notes, he was called back twice but did not answer.  Can we verify that his phone numbers are correct?  Also, I had recommended that he faithfully do his exercises each night for his vertigo.  Has he been doing them?

## 2019-11-14 ENCOUNTER — Other Ambulatory Visit: Payer: Self-pay | Admitting: Internal Medicine

## 2019-11-15 DIAGNOSIS — H8190 Unspecified disorder of vestibular function, unspecified ear: Secondary | ICD-10-CM | POA: Diagnosis not present

## 2019-12-12 ENCOUNTER — Encounter: Payer: Self-pay | Admitting: Neurology

## 2019-12-17 ENCOUNTER — Other Ambulatory Visit: Payer: Self-pay | Admitting: Neurology

## 2019-12-18 ENCOUNTER — Other Ambulatory Visit: Payer: Self-pay

## 2019-12-18 ENCOUNTER — Encounter: Payer: Self-pay | Admitting: Neurology

## 2019-12-18 ENCOUNTER — Telehealth: Payer: Self-pay | Admitting: Neurology

## 2019-12-18 ENCOUNTER — Ambulatory Visit: Payer: Medicare Other | Admitting: Neurology

## 2019-12-18 ENCOUNTER — Ambulatory Visit (INDEPENDENT_AMBULATORY_CARE_PROVIDER_SITE_OTHER): Payer: Medicare Other | Admitting: Neurology

## 2019-12-18 VITALS — BP 142/70 | HR 60 | Ht 65.0 in | Wt 153.0 lb

## 2019-12-18 DIAGNOSIS — L720 Epidermal cyst: Secondary | ICD-10-CM | POA: Diagnosis not present

## 2019-12-18 DIAGNOSIS — G510 Bell's palsy: Secondary | ICD-10-CM | POA: Diagnosis not present

## 2019-12-18 DIAGNOSIS — H81399 Other peripheral vertigo, unspecified ear: Secondary | ICD-10-CM | POA: Diagnosis not present

## 2019-12-18 DIAGNOSIS — B0221 Postherpetic geniculate ganglionitis: Secondary | ICD-10-CM

## 2019-12-18 DIAGNOSIS — R42 Dizziness and giddiness: Secondary | ICD-10-CM | POA: Diagnosis not present

## 2019-12-18 DIAGNOSIS — L02212 Cutaneous abscess of back [any part, except buttock]: Secondary | ICD-10-CM | POA: Diagnosis not present

## 2019-12-18 NOTE — Telephone Encounter (Signed)
[employer] medicare order sent to GI. No auth they will reach out to the patient to schedule.  

## 2019-12-18 NOTE — Progress Notes (Signed)
Subjective:    Patient ID: Bobby Jensen is a 86 y.o. male.  HPI     Star Age, MD, PhD [facility] [address], [address] [place], [place] [address]  Dear Dr. Mariea Clonts,  I saw your patient, Bobby Jensen, upon your kind request, in my Neurologic clinic today for initial consultation of his dizziness.  The patient is unaccompanied today.  As you know, 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  He describes this as a less severe vertiginous symptoms.  He had significant vertigo last year.  He was seen by ENT.  He had physical therapy, was also given exercises.  He improved.  He also received physical therapy after his right total hip replacement some years ago.  He reports that his symptoms are primarily a spinning or movement sensation when he lies in bed and turns.  He denies any ringing in the ears or hearing loss.  He has no significant nausea or vomiting.  In the past approximately 2 weeks he has felt a little improved.  He denies any significant lightheadedness when he stands up.  He reports trying to hydrate well, estimates that he drinks about 2 bottles of water per day, 16.9 ounces each. Of note, he has had some falls.  He fell last year.  He had a head CT without contrast on 10/11/2018 and I reviewed the results: IMPRESSION: 1. No acute intracranial abnormality. 2. Generalized age-related cerebral atrophy with mild to moderate chronic microvascular ischemic disease, stable. 3. Chronic ethmoidal and left maxillary sinusitis.  He fell in April of last year and sustained a subdural hematoma.  He  had a head CT without contrast as well as maxillofacial CT and cervical CT without contrast on 07/09/2018 and I reviewed the results: IMPRESSION: 1. Acute left frontal convexity subdural hematoma with maximum thickness of approximately 7 mm. No evidence of midline shift. 2. No fractures identified involving the facial bones. 3. No cervical spine fractures identified. 4. Multilevel degenerative disc disease, spondylosis and facet degenerative changes with multilevel foraminal stenoses as detailed above and mild multifactorial spinal stenosis at C5-6. 5. 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   He had seen Dr. Delice Lesch in neurology in 2018 for facial paralysis.  He had a brain MRI with and without contrast at the time on 06/21/2016 and I reviewed the results: IMPRESSION: Abnormal postcontrast enhancement of the entirety of the LEFT seventh nerve, including the geniculate ganglion. Central enhancement within the LEFT pons in the region of the LEFT seventh nerve nucleus. 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   Atrophy and small vessel disease.   He has not started any new medications.  He drinks caffeine in the form of coffee, 1 cup/day on average, drinks alcohol in the form of beer, maybe 2/week on average.  He is a retired English as a second language teacher, he lives with his wife.  He has a history of cataracts but has not seen his ophthalmologist in over a year.  He does take temazepam nightly at 15 mg.  He also takes Xanax 0.25 mg strength half a pill as needed, does not take it daily.  He sleeps reasonably  well.  He goes to bed between midnight and 1 AM, has nocturia about once per average night and denies recurrent morning headaches.  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  The patient has 2 daughters in [place].  His Past Medical History Is  Significant For: Past Medical History:  Diagnosis Date  . Bladder neck obstruction 03/28/2008  . Closed rib fracture   . Contact with or exposure to tuberculosis 03/15/1992  . Elevated prostate specific antigen (PSA) 03/15/1998  . Gastric ulcer, unspecified as acute or chronic, without mention of hemorrhage, perforation, or obstruction 03/15/1993  . Hypertension 07/08/1998  . Hypertrophy of prostate without urinary obstruction and other lower urinary tract symptoms (LUTS) 03/15/1989  . 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 Inguinal hernia without mention of obstruction or gangrene, unilateral or unspecified, (not specified as recurrent) 11/20/2008  . Insomnia, unspecified 11/01/2003  . Kidney calculi 08/20/2009  . Other and unspecified hyperlipidemia 12/23/2004  . Other malaise and fatigue 12/23/2004  . Pain in joint, pelvic region and thigh 12/17/2009  . Pain in joint, shoulder region 11/01/2003  . Peptic ulcer, unspecified site, unspecified as acute or chronic, without mention of hemorrhage, perforation, or obstruction 06/25/2003  . Personal history of malaria 03/16/1939  . Right rotator cuff tear 07/10/2018   Chronic per imaging 4/20  . Right shoulder injury 07/10/2018  . Rosacea 08/20/2009  . SDH (subdural hematoma) ([place])   . Type II or unspecified type diabetes mellitus without mention of complication, not stated as uncontrolled 07/15/2006  . Unspecified constipation 08/05/2010  . Unspecified hypothyroidism 12/17/2009    His Past Surgical History Is Significant For: Past Surgical History:  Procedure Laterality Date  . APPENDECTOMY  1956  . HEMORROIDECTOMY  06/2002   Dr. Lennie Hummer  . HERNIA REPAIR Bilateral 11/2008   inguinal  . PILONIDAL CYST EXCISION  1963  . PROSTATE SURGERY    . TOTAL HIP ARTHROPLASTY Right 07/10/2009   Dr. Telford Nab    His Family History Is Significant For: Family History  Problem Relation Age of Onset  . Heart disease Father     His Social  History Is Significant For: Social History   Socioeconomic History  . Marital status: Married    Spouse name: Not on file  . Number of children: Not on file  . Years of education: Not on file  . Highest education level: Not on file  Occupational History  . Not on file  Tobacco Use  . Smoking status: Former Smoker    Quit date: 07/31/1978    Years since quitting: 41.4  . Smokeless tobacco: Never Used  Substance and Sexual Activity  . Alcohol use: Yes    Comment: Occ. glass of wine  . Drug use: No    Comment: Quit in 1980  . Sexual activity: Yes    Partners: Female  Other Topics Concern  . Not on file  Social History Narrative   Lives at home with his wife    Social Determinants of Health   Financial Resource Strain:   . Difficulty of Paying Living Expenses: Not on file  Food Insecurity:   . Worried About Charity fundraiser in the Last Year: Not on file  . Ran Out of Food in the Last Year: Not on file  Transportation Needs:   . Lack of Transportation (Medical): Not on file  . Lack of Transportation (Non-Medical): Not on file  Physical Activity:   . Days of Exercise per Week: Not on file  . Minutes of Exercise per Session: Not on file  Stress:   . Feeling of Stress : Not on file  Social Connections:   . Frequency of Communication with Friends and Family: Not on file  . Frequency of Social Gatherings with Friends and Family: Not on file  . Attends Religious Services: Not on file  . Active Member of Clubs or Organizations: Not on file  . Attends Archivist Meetings: Not on file  . Marital Status: Not on file    His Allergies Are:  No Known Allergies:   His Current Medications Are:  Outpatient Encounter Medications as of 12/18/2019  Medication Sig  . acetaminophen (TYLENOL) 325 MG tablet Take 2 tablets (650 mg total) by mouth every 6 (six) hours as needed for mild pain (or Fever >/= 101).  Marland Kitchen ALPRAZolam (XANAX) 0.25 MG tablet TAKE 1 TABLET BY MOUTH THREE  TIMES DAILY  . amLODipine (NORVASC) 5 MG tablet TAKE 1 TABLET(5 MG) BY MOUTH DAILY  . aspirin 81 MG tablet Take 1 tablet (81 mg total) by mouth daily.  . Cholecalciferol (VITAMIN D PO) Take 1,000 Units by mouth daily.   Marland Kitchen glucose blood (ONE TOUCH ULTRA TEST) test strip USE TO CHECK BLOOD SUGAR ONCE DAILY AND AS DIRECTED  . levothyroxine (SYNTHROID) 75 MCG tablet Take 1 tablet (75 mcg total) by mouth daily before breakfast.  . losartan (COZAAR) 100 MG tablet TAKE 1 TABLET(100 MG) BY MOUTH DAILY  . nitroGLYCERIN (NITROSTAT) 0.4 MG SL tablet Place 1 tablet (0.4 mg total) under the tongue every 5 (five) minutes as needed for chest pain.  Glory Rosebush Delica Lancets 36U MISC Check blood sugar once daily as directed E11.22  . simvastatin (ZOCOR) 20 MG tablet TAKE 1 TABLET(20 MG) BY MOUTH AT BEDTIME  . temazepam (RESTORIL) 15 MG capsule TAKE 1 CAPSULE BY MOUTH AT BEDTIME AS NEEDED FOR SLEEP  . triamcinolone cream (KENALOG) 0.1 % APPLY EXTERNALLY TO RASH TWICE DAILY AS NEEDED   No facility-administered encounter medications on file as of 12/18/2019.  : Review of Systems:  Out of a complete 14 point review of systems, all are reviewed and negative with the exception of these symptoms as listed below:  Review of Systems  Neurological:       Here to discuss worsening dizziness. Hx of vertigo. Pt reports dizziness is intermittent. Pt was orginally referred to [facility] but could not be scheduled until April of 2022.     Objective:  Neurological Exam  Physical Exam Physical Examination:   Vitals:   12/18/19 1018 12/18/19 1024  BP:  (!) 142/70  Pulse:  60  SpO2: 97%    General Examination: The patient is a very pleasant 86 y.o. male in no acute distress. He appears well-developed and well-nourished and well groomed.  He denies any orthostatic dizziness or vertiginous symptoms.  HEENT: Normocephalic, atraumatic, pupils are equal, round and reactive to light and accommodation. Funduscopic exam  is difficult.  He has evidence of bilateral cataracts.  Hearing is grossly intact.  Tympanic membranes are clear, mild dry wax is noted, no cerumen impaction.  Face is mildly asymmetric with left facial weakness noted and normal facial sensation, speech is clear, no dysarthria, no hypophonia, no lip, neck or jaw tremor.  Neck is supple, no carotid bruits.  Airway examination reveals no focal findings, tongue protrudes centrally in palate elevates symmetrically.   No vertiginous symptoms  with sudden changes in neck position, no nystagmus, tracking is well preserved.  Chest: Clear to auscultation without wheezing, rhonchi or crackles noted.  Heart: S1+S2+0, regular and normal without murmurs, rubs or gallops noted.   Abdomen: Soft, non-tender and non-distended with normal bowel sounds appreciated on auscultation.  Extremities: There is no pitting edema in the distal lower extremities bilaterally. Pedal pulses are intact.  Skin: Warm and dry without trophic changes noted.  Musculoskeletal: exam reveals no obvious joint deformities, tenderness or joint swelling or erythema.  No significant range of motion difficulty in the right versus left hip.  Neurologically:  Mental status: The patient is awake, alert and oriented in all 4 spheres. His immediate and remote memory, attention, language skills and fund of knowledge are appropriate. There is no evidence of aphasia, agnosia, apraxia or anomia. Speech is clear with normal prosody and enunciation. Thought process is linear. Mood is normal and affect is normal.  Cranial nerves II - XII are as described above under HEENT exam. In addition: shoulder shrug is normal with equal shoulder height noted. Motor exam: Normal bulk, strength and tone is noted. There is no drift, tremor or rebound. Romberg is not tested secondary to safety concerns.  Reflexes are 2+ in the upper and lower extremities.  Toes are downgoing bilaterally.  Fine motor skills with finger taps  and foot taps and hand movements are intact bilaterally.   Cerebellar testing: No dysmetria or intention tremor on finger to nose testing. Heel to shin is unremarkable bilaterally. There is no truncal or gait ataxia.  Sensory exam: intact to light touch, vibration, temperature sense in the upper and lower extremities.  Gait, station and balance: He stands without major difficulty but does have to readjust his hip position and reports stiffness when he first stands up but he has no vertiginous symptoms or lightheadedness upon standing.  He walks without a walking aid, no shuffling noted, preserved arm swing noted.  Turns without problems.    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  In the past 2 weeks he has noted improvement without any telltale changes he has done.  He has been trying to stay active.  He does endorse recent increase in stress and noted worsening of his dizziness at the time.  He denies any orthostatic lightheadedness but does have a orthostatic mild drop in systolic and diastolic blood pressure values.  He may not always hydrate well enough.  I discussed his symptoms and my findings with the patient at length today.  It is still possible that he has intermittent vertigo which is positional.  He is encouraged to continue his inner ear exercises as per physical therapy.  He is furthermore advised to change positions slowly and stay better hydrated with water.  He is advised that I would like to proceed with a brain MRI with and without contrast to rule out a structural  cause of his symptoms. He had several CT scans last year in the context of dizziness and falls.  He has a history of left facial palsy.  I did not recommend any new medications from my end of things.  He  is advised to follow-up to see one of our nurse practitioners in about 3 months for recheck and we will call him in the interim with his MRI results to keep him posted.  He was reassured that I did not see any findings concerning for underlying stroke or parkinsonism. I answered all his questions today and he was in agreement with the plan.   Thank you very much for allowing me to participate in the care of this nice patient. If I can be of any further assistance to you please do not hesitate to call me at [phone].  Sincerely,   Star Age, MD, PhD

## 2019-12-18 NOTE — Patient Instructions (Addendum)
It was nice to meet you today.  Your exam looks good today.  Please make a follow-up appointment for checkup on your cataracts with your ophthalmologist.  I don't see any signs of stroke or Parkinson's disease.  Unfortunately, dizziness is a very common complaint but is often not due to a primary neurological reason or single underlying medical problem. Often, there a combination of factors, that result in dizziness. This includes blood pressure fluctuations, medication side effects, blood sugar fluctuations, stress, vertigo, poor sleep with sleep deprivation, dehydration, and electrolyte disturbance or other metabolic and endocrinological reasons, meaning hormone related problems such as thyroid dysfunction. We will investigate things further with a brain MRI. We will call you with the test results.   Since you did have an MRI with contrast last time, I would like to repeat your brain MRI with and without contrast.  We will check your kidney function today with one blood test.  Please stand up slowly and get your bearings 1st.  Try to do the vertigo exercises you were given by your physical therapist last year.  Please try to hydrate better with water, try to drink 3 bottles of water per day if possible, 16.9 ounces each.  Try to stay well rested.  Be cautious with your Xanax.

## 2019-12-19 LAB — COMPREHENSIVE METABOLIC PANEL
ALT: 10 IU/L (ref 0–44)
AST: 13 IU/L (ref 0–40)
Albumin/Globulin Ratio: 2.1 (ref 1.2–2.2)
Albumin: 4.4 g/dL (ref 3.5–4.6)
Alkaline Phosphatase: 69 IU/L (ref 44–121)
BUN/Creatinine Ratio: 19 (ref 10–24)
BUN: 22 mg/dL (ref 10–36)
Bilirubin Total: 0.5 mg/dL (ref 0.0–1.2)
CO2: 23 mmol/L (ref 20–29)
Calcium: 9.4 mg/dL (ref 8.6–10.2)
Chloride: 104 mmol/L (ref 96–106)
Creatinine, Ser: 1.17 mg/dL (ref 0.76–1.27)
GFR calc Af Amer: 63 mL/min/{1.73_m2} (ref >59)
GFR calc non Af Amer: 55 mL/min/{1.73_m2} — ABNORMAL LOW (ref >59)
Globulin, Total: 2.1 g/dL (ref 1.5–4.5)
Glucose: 92 mg/dL (ref 65–99)
Potassium: 4.3 mmol/L (ref 3.5–5.2)
Sodium: 140 mmol/L (ref 134–144)
Total Protein: 6.5 g/dL (ref 6.0–8.5)

## 2019-12-20 ENCOUNTER — Other Ambulatory Visit: Payer: Self-pay | Admitting: Internal Medicine

## 2019-12-20 NOTE — Telephone Encounter (Signed)
Sent to Dr. Mariea Clonts for approval. Script was last filled 09/28/2019

## 2019-12-25 ENCOUNTER — Other Ambulatory Visit: Payer: Self-pay | Admitting: Internal Medicine

## 2019-12-25 DIAGNOSIS — E1122 Type 2 diabetes mellitus with diabetic chronic kidney disease: Secondary | ICD-10-CM

## 2019-12-25 DIAGNOSIS — N183 Chronic kidney disease, stage 3 unspecified: Secondary | ICD-10-CM

## 2019-12-27 ENCOUNTER — Other Ambulatory Visit: Payer: Self-pay | Admitting: Internal Medicine

## 2019-12-27 DIAGNOSIS — G4709 Other insomnia: Secondary | ICD-10-CM

## 2019-12-27 NOTE — Telephone Encounter (Signed)
Sent to Dr. Mariea Clonts for approval last filled 09/28/2019 patient last visit was 09/20/2019

## 2020-01-09 ENCOUNTER — Ambulatory Visit
Admission: RE | Admit: 2020-01-09 | Discharge: 2020-01-09 | Disposition: A | Discharge status: Home / Self Care | Payer: Medicare Other | Source: Ambulatory Visit | Attending: Neurology | Admitting: Neurology

## 2020-01-09 DIAGNOSIS — H81399 Other peripheral vertigo, unspecified ear: Secondary | ICD-10-CM | POA: Diagnosis not present

## 2020-01-09 DIAGNOSIS — G510 Bell's palsy: Secondary | ICD-10-CM

## 2020-01-09 DIAGNOSIS — R42 Dizziness and giddiness: Secondary | ICD-10-CM

## 2020-01-09 DIAGNOSIS — B0221 Postherpetic geniculate ganglionitis: Secondary | ICD-10-CM

## 2020-01-09 MED ORDER — GADOBENATE DIMEGLUMINE 529 MG/ML IV SOLN
15.0000 mL | Freq: Once | INTRAVENOUS | Status: AC | PRN
  Administered 2020-01-09: 15 mL via INTRAVENOUS

## 2020-01-10 ENCOUNTER — Telehealth: Payer: Self-pay

## 2020-01-10 NOTE — Telephone Encounter (Signed)
-----   Message from Star Age, MD sent at 01/10/2020  3:41 PM EDT ----- Please call patient regarding his recent brain MRI.  His brain MRI with and without contrast from 01/09/2020 showed no acute findings.  In particular, no findings to explain vertigo symptoms or dizziness.  He does have some volume loss of the brain, which is typically seen over time and which we call atrophy, it is considered in the moderate range in his case.  There was one spot that is likely a small blood vessel related growth that was seen on his MRI from April 2018 as well and is deemed stable.  Again, no acute findings were seen.  I would encourage him to keep his follow-up appointment as scheduled.

## 2020-01-10 NOTE — Progress Notes (Signed)
Please call patient regarding his recent brain MRI.  His brain MRI with and without contrast from 01/09/2020 showed no acute findings.  In particular, no findings to explain vertigo symptoms or dizziness.  He does have some volume loss of the brain, which is typically seen over time and which we call atrophy, it is considered in the moderate range in his case.  There was one spot that is likely a small blood vessel related growth that was seen on his MRI from April 2018 as well and is deemed stable.  Again, no acute findings were seen.  I would encourage him to keep his follow-up appointment as scheduled.

## 2020-01-10 NOTE — Telephone Encounter (Signed)
Pt verified by name and DOB, results given per provider, pt voiced understanding all question answered. 

## 2020-02-15 ENCOUNTER — Other Ambulatory Visit: Payer: Self-pay | Admitting: Internal Medicine

## 2020-02-15 DIAGNOSIS — I10 Essential (primary) hypertension: Secondary | ICD-10-CM

## 2020-02-26 ENCOUNTER — Telehealth: Payer: Self-pay | Admitting: Neurology

## 2020-02-26 NOTE — Telephone Encounter (Signed)
I called pt. He would like his MRI results mailed to him. I will ask Hilda Blades to assist with this. I verified that pt's address is correct.

## 2020-02-26 NOTE — Telephone Encounter (Signed)
Pt called, requesting a copy of MRI results. I have left messages with Medical Records.  Would like call from the nurse

## 2020-03-10 ENCOUNTER — Telehealth: Payer: Self-pay

## 2020-03-10 NOTE — Telephone Encounter (Signed)
Results mailed 12/27

## 2020-03-19 ENCOUNTER — Other Ambulatory Visit: Payer: Medicare Other

## 2020-03-19 ENCOUNTER — Other Ambulatory Visit: Payer: Self-pay

## 2020-03-19 DIAGNOSIS — N1831 Chronic kidney disease, stage 3a: Secondary | ICD-10-CM | POA: Diagnosis not present

## 2020-03-19 DIAGNOSIS — E1122 Type 2 diabetes mellitus with diabetic chronic kidney disease: Secondary | ICD-10-CM

## 2020-03-19 DIAGNOSIS — E038 Other specified hypothyroidism: Secondary | ICD-10-CM | POA: Diagnosis not present

## 2020-03-20 ENCOUNTER — Encounter: Payer: Self-pay | Admitting: Nurse Practitioner

## 2020-03-20 ENCOUNTER — Telehealth: Payer: Self-pay

## 2020-03-20 ENCOUNTER — Ambulatory Visit (INDEPENDENT_AMBULATORY_CARE_PROVIDER_SITE_OTHER): Payer: Medicare Other | Admitting: Nurse Practitioner

## 2020-03-20 DIAGNOSIS — Z0000 Encounter for general adult medical examination without abnormal findings: Secondary | ICD-10-CM | POA: Diagnosis not present

## 2020-03-20 LAB — COMPLETE METABOLIC PANEL WITH GFR
AG Ratio: 1.7 (calc) (ref 1.0–2.5)
ALT: 7 U/L — ABNORMAL LOW (ref 9–46)
AST: 12 U/L (ref 10–35)
Albumin: 4 g/dL (ref 3.6–5.1)
Alkaline phosphatase (APISO): 58 U/L (ref 35–144)
BUN/Creatinine Ratio: 20 (calc) (ref 6–22)
BUN: 23 mg/dL (ref 7–25)
CO2: 26 mmol/L (ref 20–32)
Calcium: 9.2 mg/dL (ref 8.6–10.3)
Chloride: 105 mmol/L (ref 98–110)
Creat: 1.13 mg/dL — ABNORMAL HIGH (ref 0.70–1.11)
GFR, Est African American: 66 mL/min/{1.73_m2} (ref >=60)
GFR, Est Non African American: 57 mL/min/{1.73_m2} — ABNORMAL LOW (ref >=60)
Globulin: 2.3 g/dL (calc) (ref 1.9–3.7)
Glucose, Bld: 87 mg/dL (ref 65–99)
Potassium: 4.3 mmol/L (ref 3.5–5.3)
Sodium: 139 mmol/L (ref 135–146)
Total Bilirubin: 0.7 mg/dL (ref 0.2–1.2)
Total Protein: 6.3 g/dL (ref 6.1–8.1)

## 2020-03-20 LAB — HEMOGLOBIN A1C
Hgb A1c MFr Bld: 6.1 % of total Hgb — ABNORMAL HIGH (ref <5.7)
Mean Plasma Glucose: 128 mg/dL
eAG (mmol/L): 7.1 mmol/L

## 2020-03-20 LAB — CBC WITH DIFFERENTIAL/PLATELET
Absolute Monocytes: 637 cells/uL (ref 200–950)
Basophils Absolute: 41 cells/uL (ref 0–200)
Basophils Relative: 0.7 %
Eosinophils Absolute: 378 cells/uL (ref 15–500)
Eosinophils Relative: 6.4 %
HCT: 42.5 % (ref 38.5–50.0)
Hemoglobin: 14.2 g/dL (ref 13.2–17.1)
Lymphs Abs: 1564 cells/uL (ref 850–3900)
MCH: 29.8 pg (ref 27.0–33.0)
MCHC: 33.4 g/dL (ref 32.0–36.0)
MCV: 89.1 fL (ref 80.0–100.0)
MPV: 10.7 fL (ref 7.5–12.5)
Monocytes Relative: 10.8 %
Neutro Abs: 3280 cells/uL (ref 1500–7800)
Neutrophils Relative %: 55.6 %
Platelets: 162 10*3/uL (ref 140–400)
RBC: 4.77 10*6/uL (ref 4.20–5.80)
RDW: 13 % (ref 11.0–15.0)
Total Lymphocyte: 26.5 %
WBC: 5.9 10*3/uL (ref 3.8–10.8)

## 2020-03-20 LAB — TSH: TSH: 1.81 mIU/L (ref 0.40–4.50)

## 2020-03-20 NOTE — Patient Instructions (Signed)
Bobby Jensen , Thank you for taking time to come for your Medicare Wellness Visit. I appreciate your ongoing commitment to your health goals. Please review the following plan we discussed and let me know if I can assist you in the future.   Screening recommendations/referrals: Colonoscopy aged out Recommended yearly ophthalmology/optometry visit for glaucoma screening and checkup Recommended yearly dental visit for hygiene and checkup  Vaccinations: Influenza vaccine- RECOMMENDED.  Pneumococcal vaccine up to date Tdap vaccine up to date Shingles vaccine up to date    Advanced directives: recommended to bring to office to place on file.   Conditions/risks identified: advanced age, cardiovascular risk.   Next appointment: 1 year.   Preventive Care 71 Years and Older, Male Preventive care refers to lifestyle choices and visits with your health care provider that can promote health and wellness. What does preventive care include?  A yearly physical exam. This is also called an annual well check.  Dental exams once or twice a year.  Routine eye exams. Ask your health care provider how often you should have your eyes checked.  Personal lifestyle choices, including:  Daily care of your teeth and gums.  Regular physical activity.  Eating a healthy diet.  Avoiding tobacco and drug use.  Limiting alcohol use.  Practicing safe sex.  Taking low doses of aspirin every day.  Taking vitamin and mineral supplements as recommended by your health care provider. What happens during an annual well check? The services and screenings done by your health care provider during your annual well check will depend on your age, overall health, lifestyle risk factors, and family history of disease. Counseling  Your health care provider may ask you questions about your:  Alcohol use.  Tobacco use.  Drug use.  Emotional well-being.  Home and relationship well-being.  Sexual  activity.  Eating habits.  History of falls.  Memory and ability to understand (cognition).  Work and work Astronomer. Screening  You may have the following tests or measurements:  Height, weight, and BMI.  Blood pressure.  Lipid and cholesterol levels. These may be checked every 5 years, or more frequently if you are over 103 years old.  Skin check.  Lung cancer screening. You may have this screening every year starting at age 9 if you have a 30-pack-year history of smoking and currently smoke or have quit within the past 15 years.  Fecal occult blood test (FOBT) of the stool. You may have this test every year starting at age 66.  Flexible sigmoidoscopy or colonoscopy. You may have a sigmoidoscopy every 5 years or a colonoscopy every 10 years starting at age 74.  Prostate cancer screening. Recommendations will vary depending on your family history and other risks.  Hepatitis C blood test.  Hepatitis B blood test.  Sexually transmitted disease (STD) testing.  Diabetes screening. This is done by checking your blood sugar (glucose) after you have not eaten for a while (fasting). You may have this done every 1-3 years.  Abdominal aortic aneurysm (AAA) screening. You may need this if you are a current or former smoker.  Osteoporosis. You may be screened starting at age 85 if you are at high risk. Talk with your health care provider about your test results, treatment options, and if necessary, the need for more tests. Vaccines  Your health care provider may recommend certain vaccines, such as:  Influenza vaccine. This is recommended every year.  Tetanus, diphtheria, and acellular pertussis (Tdap, Td) vaccine. You may need a  Td booster every 10 years.  Zoster vaccine. You may need this after age 86.  Pneumococcal 13-valent conjugate (PCV13) vaccine. One dose is recommended after age 70.  Pneumococcal polysaccharide (PPSV23) vaccine. One dose is recommended after age  69. Talk to your health care provider about which screenings and vaccines you need and how often you need them. This information is not intended to replace advice given to you by your health care provider. Make sure you discuss any questions you have with your health care provider. Document Released: 03/28/2015 Document Revised: 11/19/2015 Document Reviewed: 12/31/2014 Elsevier Interactive Patient Education  2017 [place] Prevention in the Home Falls can cause injuries. They can happen to people of all ages. There are many things you can do to make your home safe and to help prevent falls. What can I do on the outside of my home?  Regularly fix the edges of walkways and driveways and fix any cracks.  Remove anything that might make you trip as you walk through a door, such as a raised step or threshold.  Trim any bushes or trees on the path to your home.  Use bright outdoor lighting.  Clear any walking paths of anything that might make someone trip, such as rocks or tools.  Regularly check to see if handrails are loose or broken. Make sure that both sides of any steps have handrails.  Any raised decks and porches should have guardrails on the edges.  Have any leaves, snow, or ice cleared regularly.  Use sand or salt on walking paths during winter.  Clean up any spills in your garage right away. This includes oil or grease spills. What can I do in the bathroom?  Use night lights.  Install grab bars by the toilet and in the tub and shower. Do not use towel bars as grab bars.  Use non-skid mats or decals in the tub or shower.  If you need to sit down in the shower, use a plastic, non-slip stool.  Keep the floor dry. Clean up any water that spills on the floor as soon as it happens.  Remove soap buildup in the tub or shower regularly.  Attach bath mats securely with double-sided non-slip rug tape.  Do not have throw rugs and other things on the floor that can make  you trip. What can I do in the bedroom?  Use night lights.  Make sure that you have a light by your bed that is easy to reach.  Do not use any sheets or blankets that are too big for your bed. They should not hang down onto the floor.  Have a firm chair that has side arms. You can use this for support while you get dressed.  Do not have throw rugs and other things on the floor that can make you trip. What can I do in the kitchen?  Clean up any spills right away.  Avoid walking on wet floors.  Keep items that you use a lot in easy-to-reach places.  If you need to reach something above you, use a strong step stool that has a grab bar.  Keep electrical cords out of the way.  Do not use floor polish or wax that makes floors slippery. If you must use wax, use non-skid floor wax.  Do not have throw rugs and other things on the floor that can make you trip. What can I do with my stairs?  Do not leave any items on the stairs.  Make sure that there are handrails on both sides of the stairs and use them. Fix handrails that are broken or loose. Make sure that handrails are as long as the stairways.  Check any carpeting to make sure that it is firmly attached to the stairs. Fix any carpet that is loose or worn.  Avoid having throw rugs at the top or bottom of the stairs. If you do have throw rugs, attach them to the floor with carpet tape.  Make sure that you have a light switch at the top of the stairs and the bottom of the stairs. If you do not have them, ask someone to add them for you. What else can I do to help prevent falls?  Wear shoes that:  Do not have high heels.  Have rubber bottoms.  Are comfortable and fit you well.  Are closed at the toe. Do not wear sandals.  If you use a stepladder:  Make sure that it is fully opened. Do not climb a closed stepladder.  Make sure that both sides of the stepladder are locked into place.  Ask someone to hold it for you, if  possible.  Clearly mark and make sure that you can see:  Any grab bars or handrails.  First and last steps.  Where the edge of each step is.  Use tools that help you move around (mobility aids) if they are needed. These include:  Canes.  Walkers.  Scooters.  Crutches.  Turn on the lights when you go into a dark area. Replace any light bulbs as soon as they burn out.  Set up your furniture so you have a clear path. Avoid moving your furniture around.  If any of your floors are uneven, fix them.  If there are any pets around you, be aware of where they are.  Review your medicines with your doctor. Some medicines can make you feel dizzy. This can increase your chance of falling. Ask your doctor what other things that you can do to help prevent falls. This information is not intended to replace advice given to you by your health care provider. Make sure you discuss any questions you have with your health care provider. Document Released: 12/26/2008 Document Revised: 08/07/2015 Document Reviewed: 04/05/2014 Elsevier Interactive Patient Education  2017 [employer].

## 2020-03-20 NOTE — Progress Notes (Signed)
This service is provided via telemedicine  No vital signs collected/recorded due to the encounter was a telemedicine visit.   Location of patient (ex: home, work): Home  Patient consents to a telephone visit:  Yes  Location of the provider (ex: office, home):  [facility]  Name of any referring provider: Bufford Spikes, DO  Names of all persons participating in the telemedicine service and their role in the encounter:  Abbey Chatters, Nurse Practitioner, Elveria Royals, CMA, and patient.   Time spent on call: 8 minutes with medical assistant

## 2020-03-20 NOTE — Progress Notes (Signed)
Labs are generally stable.  Will review at appt.

## 2020-03-20 NOTE — Telephone Encounter (Signed)
[name], raucci are scheduled for a virtual visit with your provider today.    Just as we do with appointments in the office, we must obtain your consent to participate.  Your consent will be active for this visit and any virtual visit you may have with one of our providers in the next 365 days.    If you have a MyChart account, I can also send a copy of this consent to you electronically.  All virtual visits are billed to your insurance company just like a traditional visit in the office.  As this is a virtual visit, video technology does not allow for your provider to perform a traditional examination.  This may limit your provider's ability to fully assess your condition.  If your provider identifies any concerns that need to be evaluated in person or the need to arrange testing such as labs, EKG, etc, we will make arrangements to do so.    Although advances in technology are sophisticated, we cannot ensure that it will always work on either your end or our end.  If the connection with a video visit is poor, we may have to switch to a telephone visit.  With either a video or telephone visit, we are not always able to ensure that we have a secure connection.   I need to obtain your verbal consent now.   Are you willing to proceed with your visit today?   Bobby Jensen has provided verbal consent on 03/20/2020 for a virtual visit (video or telephone).   Elveria Royals, CMA 03/20/2020  10:18 AM

## 2020-03-20 NOTE — Progress Notes (Signed)
Subjective:   Bobby Jensen is a 87 y.o. male who presents for Medicare Annual/Subsequent preventive examination.  Review of Systems     Cardiac Risk Factors include: advanced age (>22men, >50 women);hypertension;dyslipidemia;family history of premature cardiovascular disease     Objective:    There were no vitals filed for this visit. There is no height or weight on file to calculate BMI.  Advanced Directives 03/20/2020 09/20/2019 08/29/2018 05/01/2018 05/30/2017 03/23/2016 12/24/2015  Does Patient Have a Medical Advance Directive? Yes - No No No Yes Yes  Type of Advance Directive Out of facility DNR (pink MOST or yellow form) Out of facility DNR (pink MOST or yellow form) - - - Living will Living will  Does patient want to make changes to medical advance directive? No - Patient declined No - Patient declined - - - - -  Copy of Midwife in Chart? - - - - - - No - copy requested  Would patient like information on creating a medical advance directive? - - No - Patient declined No - Patient declined Yes (MAU/Ambulatory/Procedural Areas - Information given) - -  Pre-existing out of facility DNR order (yellow form or pink MOST form) Yellow form placed in chart (order not valid for inpatient use) - - - - - -    Current Medications (verified) Outpatient Encounter Medications as of 03/20/2020  Medication Sig  . acetaminophen (TYLENOL) 325 MG tablet Take 2 tablets (650 mg total) by mouth every 6 (six) hours as needed for mild pain (or Fever >/= 101).  Marland Kitchen ALPRAZolam (XANAX) 0.25 MG tablet TAKE 1 TABLET BY MOUTH THREE TIMES DAILY  . amLODipine (NORVASC) 5 MG tablet TAKE 1 TABLET(5 MG) BY MOUTH DAILY  . aspirin 81 MG tablet Take 1 tablet (81 mg total) by mouth daily.  . Cholecalciferol (VITAMIN D PO) Take 1,000 Units by mouth daily.   Marland Kitchen levothyroxine (SYNTHROID) 75 MCG tablet Take 1 tablet (75 mcg total) by mouth daily before breakfast.  . losartan (COZAAR) 100 MG tablet TAKE 1  TABLET(100 MG) BY MOUTH DAILY  . nitroGLYCERIN (NITROSTAT) 0.4 MG SL tablet Place 1 tablet (0.4 mg total) under the tongue every 5 (five) minutes as needed for chest pain.  Letta Pate Delica Lancets 33G MISC Check blood sugar once daily as directed E11.22  . ONETOUCH ULTRA test strip USE TO CHECK BLOOD SUGAR ONCE DAILY AND AS DIRECTED  . simvastatin (ZOCOR) 20 MG tablet TAKE 1 TABLET(20 MG) BY MOUTH AT BEDTIME  . temazepam (RESTORIL) 15 MG capsule TAKE 1 CAPSULE BY MOUTH AT BEDTIME AS NEEDED FOR SLEEP  . triamcinolone cream (KENALOG) 0.1 % APPLY EXTERNALLY TO RASH TWICE DAILY AS NEEDED   No facility-administered encounter medications on file as of 03/20/2020.    Allergies (verified) Patient has no known allergies.   History: Past Medical History:  Diagnosis Date  . Bladder neck obstruction 03/28/2008  . Closed rib fracture   . Contact with or exposure to tuberculosis 03/15/1992  . Elevated prostate specific antigen (PSA) 03/15/1998  . Gastric ulcer, unspecified as acute or chronic, without mention of hemorrhage, perforation, or obstruction 03/15/1993  . Hypertension 07/08/1998  . Hypertrophy of prostate without urinary obstruction and other lower urinary tract symptoms (LUTS) 03/15/1989  . 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 Inguinal hernia without mention of obstruction or gangrene, unilateral or unspecified, (not specified as recurrent) 11/20/2008  . Insomnia, unspecified 11/01/2003  . Kidney calculi 08/20/2009  . Other and unspecified  hyperlipidemia 12/23/2004  . Other malaise and fatigue 12/23/2004  . Pain in joint, pelvic region and thigh 12/17/2009  . Pain in joint, shoulder region 11/01/2003  . Peptic ulcer, unspecified site, unspecified as acute or chronic, without mention of hemorrhage, perforation, or obstruction 06/25/2003  . Personal history of malaria 03/16/1939  . Right rotator cuff tear 07/10/2018   Chronic per imaging 4/20  . Right shoulder injury 07/10/2018  .  Rosacea 08/20/2009  . SDH (subdural hematoma) (HCC)   . Type II or unspecified type diabetes mellitus without mention of complication, not stated as uncontrolled 07/15/2006  . Unspecified constipation 08/05/2010  . Unspecified hypothyroidism 12/17/2009   Past Surgical History:  Procedure Laterality Date  . APPENDECTOMY  1956  . HEMORROIDECTOMY  06/2002   Dr. Kendrick Ranch  . HERNIA REPAIR Bilateral 11/2008   inguinal  . PILONIDAL CYST EXCISION  1963  . PROSTATE SURGERY    . TOTAL HIP ARTHROPLASTY Right 07/10/2009   Dr. Priscille Kluver   Family History  Problem Relation Age of Onset  . Heart disease Father    Social History   Socioeconomic History  . Marital status: Married    Spouse name: Not on file  . Number of children: Not on file  . Years of education: Not on file  . Highest education level: Not on file  Occupational History  . Not on file  Tobacco Use  . Smoking status: Former Smoker    Quit date: 07/31/1978    Years since quitting: 41.6  . Smokeless tobacco: Never Used  Substance and Sexual Activity  . Alcohol use: Yes    Comment: Occ. glass of wine  . Drug use: No    Comment: Quit in 1980  . Sexual activity: Yes    Partners: Female  Other Topics Concern  . Not on file  Social History Narrative   Lives at home with his wife    Social Determinants of Health   Financial Resource Strain: Not on file  Food Insecurity: Not on file  Transportation Needs: Not on file  Physical Activity: Not on file  Stress: Not on file  Social Connections: Not on file    Tobacco Counseling Counseling given: Not Answered   Clinical Intake:     Pain : No/denies pain     BMI - recorded: 25 Nutritional Status: BMI 25 -29 Overweight Nutritional Risks: None Diabetes: Yes  How often do you need to have someone help you when you read instructions, pamphlets, or other written materials from your doctor or pharmacy?: 1 - Never  Diabetic?no          Activities of Daily  Living In your present state of health, do you have any difficulty performing the following activities: 03/20/2020  Hearing? N  Vision? N  Difficulty concentrating or making decisions? N  Walking or climbing stairs? N  Dressing or bathing? N  Doing errands, shopping? N  Preparing Food and eating ? N  Using the Toilet? N  In the past six months, have you accidently leaked urine? Y  Do you have problems with loss of bowel control? N  Managing your Medications? N  Managing your Finances? N  Housekeeping or managing your Housekeeping? N  Some recent data might be hidden    Patient Care Team: Kermit Balo, DO as PCP - General (Geriatric Medicine) Maris Berger, MD as Consulting Physician (Ophthalmology)  Indicate any recent Medical Services you may have received from other than [facility] providers in the past  year (date may be approximate).     Assessment:   This is a routine wellness examination for Jacolby.  Hearing/Vision screen  Hearing Screening   125Hz  250Hz  500Hz  1000Hz  2000Hz  3000Hz  4000Hz  6000Hz  8000Hz   Right ear:           Left ear:           Comments: Patient has no hearing problems.  Vision Screening Comments: Patient has no vision problems. He is scheduled to see Dr. Ellie Lunch April 11,2022  Dietary issues and exercise activities discussed: Current Exercise Habits: The patient does not participate in regular exercise at present  Goals    . <enter goal here>     Starting 03/23/2016, I will maintain my current lifestyle.       Depression Screen PHQ 2/9 Scores 03/20/2020 09/20/2019 03/19/2019 09/04/2018 07/13/2018 10/27/2017 05/30/2017  PHQ - 2 Score 0 0 0 0 0 0 0    Fall Risk Fall Risk  03/20/2020 09/20/2019 03/19/2019 09/04/2018 07/13/2018  Falls in the past year? 0 0 0 1 1  Number falls in past yr: 1 0 0 1 0  Injury with Fall? 0 0 0 1 1    FALL RISK PREVENTION PERTAINING TO THE HOME:  Any stairs in or around the home? Yes  If so, are there any without handrails? No  Home  free of loose throw rugs in walkways, pet beds, electrical cords, etc? Yes  Adequate lighting in your home to reduce risk of falls? Yes   ASSISTIVE DEVICES UTILIZED TO PREVENT FALLS:  Life alert? No  Use of a cane, walker or w/c? No  Grab bars in the bathroom? Yes  Shower chair or bench in shower? No  Elevated toilet seat or a handicapped toilet? No   TIMED UP AND GO:  Was the test performed? No .    Cognitive Function: MMSE - Mini Mental State Exam 05/30/2017 03/23/2016 07/30/2015  Not completed: - (No Data) -  Orientation to time 5 - 5  Orientation to Place 5 - 5  Registration 3 - 3  Attention/ Calculation 5 - 5  Recall 2 - 2  Language- name 2 objects 2 - 2  Language- repeat 1 - 1  Language- follow 3 step command 3 - 3  Language- read & follow direction 1 - 1  Write a sentence 1 - 1  Copy design 1 - 1  Total score 29 - 29     6CIT Screen 03/20/2020  What Year? 0 points  What month? 0 points  What time? 0 points  Count back from 20 0 points  Months in reverse 0 points  Repeat phrase 0 points  Total Score 0    Immunizations Immunization History  Administered Date(s) Administered  . Influenza Split 12/28/2011  . Influenza, High Dose Seasonal PF 01/26/2018, 12/28/2018  . Influenza,inj,Quad PF,6+ Mos 04/01/2015, 12/24/2015, 01/05/2017  . Influenza-Unspecified 01/14/2013, 12/10/2013, 12/03/2014  . PFIZER SARS-COV-2 Vaccination 04/16/2019, 05/13/2019, 01/10/2020  . Pneumococcal Conjugate-13 03/23/2016  . Pneumococcal-Unspecified 02/15/1992  . Td 03/15/1990  . Tdap 03/05/2013  . Zoster Recombinat (Shingrix) 06/30/2016, 03/11/2017    TDAP status: Up to date  Flu Vaccine status: Due, Education has been provided regarding the importance of this vaccine. Advised may receive this vaccine at local pharmacy or Health Dept. Aware to provide a copy of the vaccination record if obtained from local pharmacy or Health Dept. Verbalized acceptance and understanding.  Pneumococcal  vaccine status: Up to date  Covid-19 vaccine status: Completed vaccines  Qualifies for Shingles Vaccine? Yes   Zostavax completed No   Shingrix Completed?: Yes  Screening Tests Health Maintenance  Topic Date Due  . PNA vac Low Risk Adult (2 of 2 - PPSV23) 03/23/2017  . FOOT EXAM  10/28/2018  . OPHTHALMOLOGY EXAM  01/14/2019  . INFLUENZA VACCINE  10/14/2019  . HEMOGLOBIN A1C  09/16/2020  . TETANUS/TDAP  03/06/2023  . COVID-19 Vaccine  Completed    Health Maintenance  Health Maintenance Due  Topic Date Due  . PNA vac Low Risk Adult (2 of 2 - PPSV23) 03/23/2017  . FOOT EXAM  10/28/2018  . OPHTHALMOLOGY EXAM  01/14/2019  . INFLUENZA VACCINE  10/14/2019    Colorectal cancer screening: No longer required.   Lung Cancer Screening: (Low Dose CT Chest recommended if Age 18-80 years, 30 pack-year currently smoking OR have quit w/in 15years.) does not qualify.   Lung Cancer Screening Referral: na  Additional Screening:  Hepatitis C Screening: does not qualify  Vision Screening: Recommended annual ophthalmology exams for early detection of glaucoma and other disorders of the eye. Is the patient up to date with their annual eye exam?  Yes  Who is the provider or what is the name of the office in which the patient attends annual eye exams? [facility] If pt is not established with a provider, would they like to be referred to a provider to establish care? No .   Dental Screening: Recommended annual dental exams for proper oral hygiene  Community Resource Referral / Chronic Care Management: CRR required this visit?  No   CCM required this visit?  No      Plan:     I have personally reviewed and noted the following in the patient's chart:   . Medical and social history . Use of alcohol, tobacco or illicit drugs  . Current medications and supplements . Functional ability and status . Nutritional status . Physical activity . Advanced directives . List of other  physicians . Hospitalizations, surgeries, and ER visits in previous 12 months . Vitals . Screenings to include cognitive, depression, and falls . Referrals and appointments  In addition, I have reviewed and discussed with patient certain preventive protocols, quality metrics, and best practice recommendations. A written personalized care plan for preventive services as well as general preventive health recommendations were provided to patient.     Sharon Seller, NP   03/20/2020    Virtual Visit via Telephone Note  I connected with@ on 03/20/20 at 10:00 AM EST by telephone and verified that I am speaking with the correct person using two identifiers.  Location: Patient: home Provider: [facility]    I discussed the limitations, risks, security and privacy concerns of performing an evaluation and management service by telephone and the availability of in person appointments. I also discussed with the patient that there may be a patient responsible charge related to this service. The patient expressed understanding and agreed to proceed.   I discussed the assessment and treatment plan with the patient. The patient was provided an opportunity to ask questions and all were answered. The patient agreed with the plan and demonstrated an understanding of the instructions.   The patient was advised to call back or seek an in-person evaluation if the symptoms worsen or if the condition fails to improve as anticipated.  I provided 18 minutes of non-face-to-face time during this encounter.  Janene Harvey. [employer] printed and mailed

## 2020-03-24 ENCOUNTER — Ambulatory Visit (INDEPENDENT_AMBULATORY_CARE_PROVIDER_SITE_OTHER): Payer: Medicare Other | Admitting: Internal Medicine

## 2020-03-24 ENCOUNTER — Other Ambulatory Visit: Payer: Self-pay

## 2020-03-24 ENCOUNTER — Encounter: Payer: Self-pay | Admitting: Internal Medicine

## 2020-03-24 VITALS — BP 140/84 | HR 73 | Temp 97.7°F | Ht 65.0 in | Wt 155.4 lb

## 2020-03-24 DIAGNOSIS — E1122 Type 2 diabetes mellitus with diabetic chronic kidney disease: Secondary | ICD-10-CM

## 2020-03-24 DIAGNOSIS — I10 Essential (primary) hypertension: Secondary | ICD-10-CM

## 2020-03-24 DIAGNOSIS — E785 Hyperlipidemia, unspecified: Secondary | ICD-10-CM | POA: Diagnosis not present

## 2020-03-24 DIAGNOSIS — N1831 Chronic kidney disease, stage 3a: Secondary | ICD-10-CM

## 2020-03-24 DIAGNOSIS — F4321 Adjustment disorder with depressed mood: Secondary | ICD-10-CM

## 2020-03-24 DIAGNOSIS — G510 Bell's palsy: Secondary | ICD-10-CM | POA: Diagnosis not present

## 2020-03-24 DIAGNOSIS — G4709 Other insomnia: Secondary | ICD-10-CM

## 2020-03-24 DIAGNOSIS — E038 Other specified hypothyroidism: Secondary | ICD-10-CM

## 2020-03-24 DIAGNOSIS — E1169 Type 2 diabetes mellitus with other specified complication: Secondary | ICD-10-CM | POA: Diagnosis not present

## 2020-03-24 DIAGNOSIS — R42 Dizziness and giddiness: Secondary | ICD-10-CM

## 2020-03-24 MED ORDER — ALPRAZOLAM 0.25 MG PO TABS: 0.2500 mg | ORAL_TABLET | Freq: Three times a day (TID) | ORAL | 1 refills | Status: DC

## 2020-03-24 MED ORDER — TEMAZEPAM 15 MG PO CAPS: ORAL_CAPSULE | ORAL | 1 refills | Status: DC

## 2020-03-24 NOTE — Progress Notes (Signed)
Location:  [facility] clinic Provider:  Marguis Mathieson L. Mariea Clonts, D.O., C.M.D.  Code Status: DNR Goals of Care:  Advanced Directives 03/24/2020  Does Patient Have a Medical Advance Directive? Yes  Type of Advance Directive Out of facility DNR (pink MOST or yellow form)  Does patient want to make changes to medical advance directive? No - Patient declined  Copy of [place] in Chart? -  Would patient like information on creating a medical advance directive? -  Pre-existing out of facility DNR order (yellow form or pink MOST form) Yellow form placed in chart (order not valid for inpatient use)     Chief Complaint  Patient presents with  . Medical Management of Chronic Issues    6 month follow up    HPI: Patient is a 87 y.o. male seen today for medical management of chronic diseases.    Dizziness:  Saw neurology--nothing new on MRI.   Lasts 2-3 days and goes away again.  More severe since his son's death.  He's still able to drive and he has all of his faculties.  Cannot move abruptly or gets dizzy.  His wife has it, too.  They happen about 1x per week.  Sits down and then he's fine.  His wife has been seeing PA at [facility]--appt is 1/24.    He continues to mourn the loss of his son.  He's also lost all of his friends in church and outside of church.  Does not want medications for his mood.    His wife has colon cancer in remission.  She thinks she has cancer in her chest.  It takes hours for her to get started in the am so he has begun to cook.  Makes a good breakfast, cleans dusts.  She did not want someone else to do it.   Daughters come on weekends and help some, but they have their own families.  He says they have lived too long and he feels like they are burdens to their family.      Past Medical History:  Diagnosis Date  . Bladder neck obstruction 03/28/2008  . Closed rib fracture   . Contact with or exposure to tuberculosis 03/15/1992  . Elevated prostate specific antigen  (PSA) 03/15/1998  . Gastric ulcer, unspecified as acute or chronic, without mention of hemorrhage, perforation, or obstruction 03/15/1993  . Hypertension 07/08/1998  . Hypertrophy of prostate without urinary obstruction and other lower urinary tract symptoms (LUTS) 03/15/1989  . 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 Inguinal hernia without mention of obstruction or gangrene, unilateral or unspecified, (not specified as recurrent) 11/20/2008  . Insomnia, unspecified 11/01/2003  . Kidney calculi 08/20/2009  . Other and unspecified hyperlipidemia 12/23/2004  . Other malaise and fatigue 12/23/2004  . Pain in joint, pelvic region and thigh 12/17/2009  . Pain in joint, shoulder region 11/01/2003  . Peptic ulcer, unspecified site, unspecified as acute or chronic, without mention of hemorrhage, perforation, or obstruction 06/25/2003  . Personal history of malaria 03/16/1939  . Right rotator cuff tear 07/10/2018   Chronic per imaging 4/20  . Right shoulder injury 07/10/2018  . Rosacea 08/20/2009  . SDH (subdural hematoma) ([place])   . Type II or unspecified type diabetes mellitus without mention of complication, not stated as uncontrolled 07/15/2006  . Unspecified constipation 08/05/2010  . Unspecified hypothyroidism 12/17/2009    Past Surgical History:  Procedure Laterality Date  . APPENDECTOMY  1956  . HEMORROIDECTOMY  06/2002   Dr. Octavia Bruckner  [place] Bilateral 11/2008   inguinal  . PILONIDAL CYST EXCISION  1963  . PROSTATE SURGERY    . TOTAL HIP ARTHROPLASTY Right 07/10/2009   Dr. Telford Nab    No Known Allergies  Outpatient Encounter Medications as of 03/24/2020  Medication Sig  . acetaminophen (TYLENOL) 325 MG tablet Take 2 tablets (650 mg total) by mouth every 6 (six) hours as needed for mild pain (or Fever >/= 101).  Marland Kitchen ALPRAZolam (XANAX) 0.25 MG tablet TAKE 1 TABLET BY MOUTH THREE TIMES DAILY  . amLODipine (NORVASC) 5 MG tablet TAKE 1 TABLET(5 MG) BY MOUTH DAILY  .  aspirin 81 MG tablet Take 1 tablet (81 mg total) by mouth daily.  . Cholecalciferol (VITAMIN D PO) Take 1,000 Units by mouth daily.   Marland Kitchen levothyroxine (SYNTHROID) 75 MCG tablet Take 1 tablet (75 mcg total) by mouth daily before breakfast.  . losartan (COZAAR) 100 MG tablet TAKE 1 TABLET(100 MG) BY MOUTH DAILY  . nitroGLYCERIN (NITROSTAT) 0.4 MG SL tablet Place 1 tablet (0.4 mg total) under the tongue every 5 (five) minutes as needed for chest pain.  Glory Rosebush Delica Lancets [id number] MISC Check blood sugar once daily as directed E11.22  . ONETOUCH ULTRA test strip USE TO CHECK BLOOD SUGAR ONCE DAILY AND AS DIRECTED  . simvastatin (ZOCOR) 20 MG tablet TAKE 1 TABLET(20 MG) BY MOUTH AT BEDTIME  . temazepam (RESTORIL) 15 MG capsule TAKE 1 CAPSULE BY MOUTH AT BEDTIME AS NEEDED FOR SLEEP  . triamcinolone cream (KENALOG) 0.1 % APPLY EXTERNALLY TO RASH TWICE DAILY AS NEEDED   No facility-administered encounter medications on file as of 03/24/2020.    Review of Systems:  Review of Systems  Constitutional: Negative for chills, fever and malaise/fatigue.  HENT: Positive for hearing loss and tinnitus. Negative for congestion and sore throat.   Eyes: Negative for blurred vision.       Glasses, Bell's palsy (left eye)  Respiratory: Negative for cough and shortness of breath.   Cardiovascular: Positive for palpitations. Negative for chest pain, orthopnea, leg swelling and PND.       Does occasionally get a chest pressure that he associates with anxiety but is relieved with ntg; when checks his pulse, he occasionally gets a skipped beat  Gastrointestinal: Negative for abdominal pain.  Genitourinary: Negative for dysuria.  Musculoskeletal: Negative for falls and joint pain.  Skin: Negative for itching and rash.  Neurological: Negative for dizziness, loss of consciousness and weakness.       Vertigo with rapid positional changes  Endo/Heme/Allergies: Bruises/bleeds easily.  Psychiatric/Behavioral: Positive for  depression. Negative for memory loss and suicidal ideas. The patient is nervous/anxious and has insomnia.     Health Maintenance  Topic Date Due  . PNA vac Low Risk Adult (2 of 2 - PPSV23) 03/23/2017  . FOOT EXAM  10/28/2018  . OPHTHALMOLOGY EXAM  01/14/2019  . INFLUENZA VACCINE  10/14/2019  . HEMOGLOBIN A1C  09/16/2020  . TETANUS/TDAP  03/06/2023  . COVID-19 Vaccine  Completed    Physical Exam: Vitals:   03/24/20 1103  BP: 140/84  Pulse: 73  Temp: 97.7 F (36.5 C)  TempSrc: Temporal  SpO2: 98%  Weight: 155 lb 6.4 oz (70.5 kg)  Height: 5\' 5"  (1.651 m)   Body mass index is 25.86 kg/m. Physical Exam Vitals reviewed.  Constitutional:      Appearance: Normal appearance.  HENT:     Head: Normocephalic and atraumatic.  Eyes:     Conjunctiva/sclera:  Conjunctivae normal.     Comments: Left eye affected from bell's palsy years ago  Cardiovascular:     Rate and Rhythm: Regular rhythm. Bradycardia present.     Comments: No PVCs/PACs heard during exam or felt when I checked his pulse Pulmonary:     Effort: Pulmonary effort is normal.     Breath sounds: Normal breath sounds. No wheezing, rhonchi or rales.  Abdominal:     General: Bowel sounds are normal.  Musculoskeletal:        General: Normal range of motion.     Right lower leg: No edema.     Left lower leg: No edema.  Neurological:     Mental Status: He is alert and oriented to person, place, and time. Mental status is at baseline.     Gait: Gait abnormal.     Comments: Unsteady when first stood up  Psychiatric:        Mood and Affect: Mood normal.     Comments: Has had flat affect, been down since son's death     Labs reviewed: Basic Metabolic Panel: Recent Labs    09/14/19 0820 11/01/19 0821 12/18/19 1139 03/19/20 0807  NA 138  --  140 139  K 4.0  --  4.3 4.3  CL 106  --  104 105  CO2 25  --  23 26  GLUCOSE 92  --  92 87  BUN 22  --  22 23  CREATININE 1.00  --  1.17 1.13*  CALCIUM 9.2  --  9.4 9.2   TSH 7.71* 1.78  --  1.81   Liver Function Tests: Recent Labs    12/18/19 1139 03/19/20 0807  AST 13 12  ALT 10 7*  ALKPHOS 69  --   BILITOT 0.5 0.7  PROT 6.5 6.3  ALBUMIN 4.4  --    No results for input(s): LIPASE, AMYLASE in the last 8760 hours. No results for input(s): AMMONIA in the last 8760 hours. CBC: Recent Labs    03/19/20 0807  WBC 5.9  NEUTROABS 3,280  HGB 14.2  HCT 42.5  MCV 89.1  PLT 162   Lipid Panel: No results for input(s): CHOL, HDL, LDLCALC, TRIG, CHOLHDL, LDLDIRECT in the last 8760 hours. Lab Results  Component Value Date   HGBA1C 6.1 (H) 03/19/2020    Procedures since last visit: No results found.  Assessment/Plan 1. Type 2 diabetes mellitus with stage 3a chronic kidney disease, without long-term current use of insulin (HCC) -well controlled, cont same regimen - TSH; Future - CBC with Differential/Platelet; Future - Hemoglobin A1c; Future - COMPLETE METABOLIC PANEL WITH GFR; Future  2. Other specified hypothyroidism -tsh at goal, cont same levothyroxine and monitor - COMPLETE METABOLIC PANEL WITH GFR; Future  3. Essential hypertension -bp at goal, not lightheaded, cont same regimen and monitor - CBC with Differential/Platelet; Future - COMPLETE METABOLIC PANEL WITH GFR; Future  4. Bell's palsy -longstanding, remains - COMPLETE METABOLIC PANEL WITH GFR; Future  5. Hyperlipidemia associated with type 2 diabetes mellitus ([place]) -controlled with zocor, cont same regimen and monitor - Lipid panel; Future - COMPLETE METABOLIC PANEL WITH GFR; Future  6. 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 Meda Coffee again to check in because of the occasional palpitations  -his goals are comfort-based - COMPLETE METABOLIC PANEL WITH GFR; Future  7. Grief -ongoing since son's untimely death--he makes statements like he wishes he'd been the one that passed, but is not interested in counseling or medications - COMPLETE METABOLIC PANEL WITH GFR; Future  8. Other insomnia -longstanding related to sleep cycle challenges from being a pilot, he's been informed of potential side effects increasing at his advanced age, but has been unable to rest without it - temazepam (RESTORIL) 15 MG capsule; TAKE 1 CAPSULE BY MOUTH AT BEDTIME AS NEEDED FOR SLEEP  Dispense: 90 capsule; Refill: 1 - COMPLETE METABOLIC PANEL WITH GFR; Future  Labs/tests ordered:   Lab Orders     TSH     CBC with Differential/Platelet     Lipid panel     Hemoglobin A1c     COMPLETE METABOLIC PANEL WITH GFR   Next appt:  6 mos for pneumovax, fasting labs before  Note that I allowed him to leave and he had not gotten his high dose flu shot that he agreed to get.  Bobby Jensen L. Deanie Jupiter, D.O. [facility] [address], [place] [address] Cell Phone (Mon-Fri 8am-5pm):  [phone] On Call:  [phone] & follow prompts after 5pm & weekends Office Phone:  [phone] Office Fax:  [phone]

## 2020-03-24 NOTE — Patient Instructions (Signed)
When you come next time, I would like you get to your one pneumonia shot again because you were only 64 when you had it.

## 2020-04-09 ENCOUNTER — Ambulatory Visit: Payer: Medicare Other | Admitting: Adult Health

## 2020-04-09 DIAGNOSIS — L720 Epidermal cyst: Secondary | ICD-10-CM | POA: Diagnosis not present

## 2020-04-09 DIAGNOSIS — L02212 Cutaneous abscess of back [any part, except buttock]: Secondary | ICD-10-CM | POA: Diagnosis not present

## 2020-05-05 ENCOUNTER — Encounter: Payer: Self-pay | Admitting: Internal Medicine

## 2020-05-06 DIAGNOSIS — E119 Type 2 diabetes mellitus without complications: Secondary | ICD-10-CM | POA: Diagnosis not present

## 2020-05-06 DIAGNOSIS — H2513 Age-related nuclear cataract, bilateral: Secondary | ICD-10-CM | POA: Diagnosis not present

## 2020-05-06 DIAGNOSIS — H5203 Hypermetropia, bilateral: Secondary | ICD-10-CM | POA: Diagnosis not present

## 2020-05-19 ENCOUNTER — Other Ambulatory Visit: Payer: Self-pay | Admitting: Internal Medicine

## 2020-06-12 ENCOUNTER — Ambulatory Visit: Payer: Medicare Other | Admitting: Neurology

## 2020-06-18 ENCOUNTER — Other Ambulatory Visit: Payer: Self-pay

## 2020-06-18 MED ORDER — TRIAMCINOLONE ACETONIDE 0.1 % EX CREA: TOPICAL_CREAM | CUTANEOUS | 0 refills | Status: AC

## 2020-06-22 ENCOUNTER — Other Ambulatory Visit: Payer: Self-pay | Admitting: Internal Medicine

## 2020-06-22 DIAGNOSIS — E1122 Type 2 diabetes mellitus with diabetic chronic kidney disease: Secondary | ICD-10-CM

## 2020-07-01 ENCOUNTER — Ambulatory Visit: Payer: Medicare Other | Admitting: Neurology

## 2020-08-14 ENCOUNTER — Other Ambulatory Visit: Payer: Self-pay | Admitting: *Deleted

## 2020-08-14 DIAGNOSIS — I10 Essential (primary) hypertension: Secondary | ICD-10-CM

## 2020-08-14 MED ORDER — AMLODIPINE BESYLATE 5 MG PO TABS
ORAL_TABLET | ORAL | 1 refills | Status: DC
Start: 2020-08-14 — End: 2021-04-17

## 2020-08-14 MED ORDER — SIMVASTATIN 20 MG PO TABS: ORAL_TABLET | ORAL | 1 refills | Status: DC

## 2020-08-14 NOTE — Addendum Note (Signed)
Addended by: Rafael Bihari A on: 08/14/2020 09:46 AM   Modules accepted: Orders

## 2020-08-14 NOTE — Telephone Encounter (Signed)
Pharmacy requested refill

## 2020-09-05 ENCOUNTER — Other Ambulatory Visit: Payer: Self-pay

## 2020-09-05 DIAGNOSIS — N182 Chronic kidney disease, stage 2 (mild): Secondary | ICD-10-CM

## 2020-09-05 DIAGNOSIS — E785 Hyperlipidemia, unspecified: Secondary | ICD-10-CM

## 2020-09-05 DIAGNOSIS — G510 Bell's palsy: Secondary | ICD-10-CM

## 2020-09-05 DIAGNOSIS — E039 Hypothyroidism, unspecified: Secondary | ICD-10-CM

## 2020-09-05 DIAGNOSIS — I10 Essential (primary) hypertension: Secondary | ICD-10-CM

## 2020-09-05 DIAGNOSIS — E0822 Diabetes mellitus due to underlying condition with diabetic chronic kidney disease: Secondary | ICD-10-CM

## 2020-09-05 DIAGNOSIS — E1122 Type 2 diabetes mellitus with diabetic chronic kidney disease: Secondary | ICD-10-CM

## 2020-09-05 DIAGNOSIS — E1169 Type 2 diabetes mellitus with other specified complication: Secondary | ICD-10-CM

## 2020-09-17 ENCOUNTER — Other Ambulatory Visit: Payer: Self-pay

## 2020-09-17 ENCOUNTER — Other Ambulatory Visit: Payer: Medicare Other

## 2020-09-17 DIAGNOSIS — E785 Hyperlipidemia, unspecified: Secondary | ICD-10-CM

## 2020-09-17 DIAGNOSIS — E1122 Type 2 diabetes mellitus with diabetic chronic kidney disease: Secondary | ICD-10-CM

## 2020-09-17 DIAGNOSIS — I10 Essential (primary) hypertension: Secondary | ICD-10-CM

## 2020-09-17 DIAGNOSIS — G510 Bell's palsy: Secondary | ICD-10-CM

## 2020-09-17 DIAGNOSIS — N1831 Chronic kidney disease, stage 3a: Secondary | ICD-10-CM | POA: Diagnosis not present

## 2020-09-17 DIAGNOSIS — E1169 Type 2 diabetes mellitus with other specified complication: Secondary | ICD-10-CM | POA: Diagnosis not present

## 2020-09-18 LAB — COMPLETE METABOLIC PANEL WITH GFR
AG Ratio: 1.8 (calc) (ref 1.0–2.5)
ALT: 8 U/L — ABNORMAL LOW (ref 9–46)
AST: 12 U/L (ref 10–35)
Albumin: 4.1 g/dL (ref 3.6–5.1)
Alkaline phosphatase (APISO): 56 U/L (ref 35–144)
BUN/Creatinine Ratio: 23 (calc) — ABNORMAL HIGH (ref 6–22)
BUN: 26 mg/dL — ABNORMAL HIGH (ref 7–25)
CO2: 24 mmol/L (ref 20–32)
Calcium: 9.4 mg/dL (ref 8.6–10.3)
Chloride: 109 mmol/L (ref 98–110)
Creat: 1.12 mg/dL — ABNORMAL HIGH (ref 0.70–1.11)
GFR, Est African American: 67 mL/min/{1.73_m2} (ref >=60)
GFR, Est Non African American: 58 mL/min/{1.73_m2} — ABNORMAL LOW (ref >=60)
Globulin: 2.3 g/dL (calc) (ref 1.9–3.7)
Glucose, Bld: 87 mg/dL (ref 65–139)
Potassium: 4 mmol/L (ref 3.5–5.3)
Sodium: 141 mmol/L (ref 135–146)
Total Bilirubin: 0.6 mg/dL (ref 0.2–1.2)
Total Protein: 6.4 g/dL (ref 6.1–8.1)

## 2020-09-18 LAB — LIPID PANEL
Cholesterol: 158 mg/dL (ref <200)
HDL: 47 mg/dL (ref >=40)
LDL Cholesterol (Calc): 96 mg/dL (calc)
Non-HDL Cholesterol (Calc): 111 mg/dL (calc) (ref <130)
Total CHOL/HDL Ratio: 3.4 (calc) (ref <5.0)
Triglycerides: 64 mg/dL (ref <150)

## 2020-09-18 LAB — CBC WITH DIFFERENTIAL/PLATELET
Absolute Monocytes: 554 cells/uL (ref 200–950)
Basophils Absolute: 38 cells/uL (ref 0–200)
Basophils Relative: 0.6 %
Eosinophils Absolute: 403 cells/uL (ref 15–500)
Eosinophils Relative: 6.4 %
HCT: 42.3 % (ref 38.5–50.0)
Hemoglobin: 13.9 g/dL (ref 13.2–17.1)
Lymphs Abs: 1777 cells/uL (ref 850–3900)
MCH: 29.8 pg (ref 27.0–33.0)
MCHC: 32.9 g/dL (ref 32.0–36.0)
MCV: 90.8 fL (ref 80.0–100.0)
MPV: 10.9 fL (ref 7.5–12.5)
Monocytes Relative: 8.8 %
Neutro Abs: 3528 cells/uL (ref 1500–7800)
Neutrophils Relative %: 56 %
Platelets: 158 10*3/uL (ref 140–400)
RBC: 4.66 10*6/uL (ref 4.20–5.80)
RDW: 13.4 % (ref 11.0–15.0)
Total Lymphocyte: 28.2 %
WBC: 6.3 10*3/uL (ref 3.8–10.8)

## 2020-09-18 LAB — HEMOGLOBIN A1C
Hgb A1c MFr Bld: 5.8 % of total Hgb — ABNORMAL HIGH (ref <5.7)
Mean Plasma Glucose: 120 mg/dL
eAG (mmol/L): 6.6 mmol/L

## 2020-09-18 LAB — TSH: TSH: 1.94 mIU/L (ref 0.40–4.50)

## 2020-09-22 ENCOUNTER — Ambulatory Visit: Payer: Medicare Other | Admitting: Internal Medicine

## 2020-09-22 ENCOUNTER — Other Ambulatory Visit: Payer: Self-pay | Admitting: *Deleted

## 2020-09-22 DIAGNOSIS — E038 Other specified hypothyroidism: Secondary | ICD-10-CM

## 2020-09-22 MED ORDER — LEVOTHYROXINE SODIUM 75 MCG PO TABS: 75.0000 ug | ORAL_TABLET | Freq: Every day | ORAL | 1 refills | Status: DC

## 2020-09-22 NOTE — Telephone Encounter (Signed)
Pharmacy requested refill

## 2020-09-23 ENCOUNTER — Other Ambulatory Visit: Payer: Self-pay

## 2020-09-23 ENCOUNTER — Ambulatory Visit (INDEPENDENT_AMBULATORY_CARE_PROVIDER_SITE_OTHER): Payer: Medicare Other | Admitting: Family Medicine

## 2020-09-23 ENCOUNTER — Encounter: Payer: Self-pay | Admitting: Family Medicine

## 2020-09-23 VITALS — BP 128/64 | HR 51 | Temp 97.5°F | Ht 65.0 in | Wt 150.0 lb

## 2020-09-23 DIAGNOSIS — E038 Other specified hypothyroidism: Secondary | ICD-10-CM | POA: Diagnosis not present

## 2020-09-23 DIAGNOSIS — E1122 Type 2 diabetes mellitus with diabetic chronic kidney disease: Secondary | ICD-10-CM | POA: Diagnosis not present

## 2020-09-23 DIAGNOSIS — E785 Hyperlipidemia, unspecified: Secondary | ICD-10-CM | POA: Diagnosis not present

## 2020-09-23 DIAGNOSIS — R42 Dizziness and giddiness: Secondary | ICD-10-CM

## 2020-09-23 DIAGNOSIS — N1831 Chronic kidney disease, stage 3a: Secondary | ICD-10-CM | POA: Diagnosis not present

## 2020-09-23 DIAGNOSIS — G510 Bell's palsy: Secondary | ICD-10-CM

## 2020-09-23 NOTE — Patient Instructions (Signed)
Epley maneuver. Try it How to Perform the Epley Maneuver The Epley maneuver is an exercise that relieves symptoms of vertigo. Vertigo is the feeling that you or your surroundings are moving when they are not. When you feel vertigo, you may feel like the room is spinning and may have trouble walking. The Epley maneuver is used for a type of vertigo caused by a calcium deposit in a part of the inner ear. The maneuver involves changing headpositions to help the deposit move out of the area. You can do this maneuver at home whenever you have symptoms of vertigo. You canrepeat it in 24 hours if your vertigo has not gone away. Even though the Epley maneuver may relieve your vertigo for a few weeks, it is possible that your symptoms will return. This maneuver relieves vertigo, but itdoes not relieve dizziness. What are the risks? If it is done correctly, the Epley maneuver is considered safe. Sometimes it can lead to dizziness or nausea that goes away after a short time. If you develop other symptoms--such as changes in vision, weakness, or numbness--stopdoing the maneuver and call your health care provider. Supplies needed: A bed or table. A pillow. How to do the Epley maneuver     Sit on the edge of a bed or table with your back straight and your legs extended or hanging over the edge of the bed or table. Turn your head halfway toward the affected ear or side as told by your health care provider. Lie backward quickly with your head turned until you are lying flat on your back. Your head should dangle (head-hanging position). You may want to position a pillow under your shoulders. Hold this position for at least 30 seconds. If you feel dizzy or have symptoms of vertigo, continue to hold the position until the symptoms stop. Turn your head to the opposite direction until your unaffected ear is facing down. Your head should continue to dangle. Hold this position for at least 30 seconds. If you feel dizzy  or have symptoms of vertigo, continue to hold the position until the symptoms stop. Turn your whole body to the same side as your head so that you are positioned on your side. Your head will now be nearly facedown and no longer needs to dangle. Hold for at least 30 seconds. If you feel dizzy or have symptoms of vertigo, continue to hold the position until the symptoms stop. Sit back up. You can repeat the maneuver in 24 hours if your vertigo does not go away. Follow these instructions at home: For 24 hours after doing the Epley maneuver: Keep your head in an upright position. When lying down to sleep or rest, keep your head raised (elevated) with two or more pillows. Avoid excessive neck movements. Activity Do not drive or use machinery if you feel dizzy. After doing the Epley maneuver, return to your normal activities as told by your health care provider. Ask your health care provider what activities are safe for you. General instructions Drink enough fluid to keep your urine pale yellow. Do not drink alcohol. Take over-the-counter and prescription medicines only as told by your health care provider. Keep all follow-up visits. This is important. Preventing vertigo symptoms Ask your health care provider if there is anything you should do at home to prevent vertigo. He or she may recommend that you: Keep your head elevated with two or more pillows while you sleep. Do not sleep on the side of your affected ear. Get  up slowly from bed. Avoid sudden movements during the day. Avoid extreme head positions or movement, such as looking up or bending over. Contact a health care provider if: Your vertigo gets worse. You have other symptoms, including: Nausea. Vomiting. Headache. Get help right away if you: Have vision changes. Have a headache or neck pain that is severe or getting worse. Cannot stop vomiting. Have new numbness or weakness in any part of your body. These symptoms may  represent a serious problem that is an emergency. Do not wait to see if the symptoms will go away. Get medical help right away. Call your local emergency services (911 in the [place] Do not drive yourself to the hospital. Summary Vertigo is the feeling that you or your surroundings are moving when they are not. The Epley maneuver is an exercise that relieves symptoms of vertigo. If the Epley maneuver is done correctly, it is considered safe. This information is not intended to replace advice given to you by your health care provider. Make sure you discuss any questions you have with your healthcare provider. Document Revised: 01/30/2020 Document Reviewed: 01/30/2020 Elsevier Patient Education  2022 [employer].

## 2020-09-23 NOTE — Progress Notes (Signed)
Provider:  Alain Honey, MD  Careteam: Patient Care Team: [name], MD as PCP - General (Family Medicine) [name], MD as Consulting Physician (Ophthalmology)  PLACE OF SERVICE:  [place] Directive information    No Known Allergies  Chief Complaint  Patient presents with   Medical Management of Chronic Issues    Patient presents today for a 6 month follow-up for HTN,HLD and HT.     HPI: Patient is a 87 y.o. male he is here for 62-month follow-up.  Problems include hypertension hyperlipidemia.  He is a retired English as a second language teacher He has no specific complaints.  We did talk about the loss of his son who he has been grieving over. Had recent blood work done last week and we took today's visit as opportunity to discuss his results. Is anxious to get back to playing golf.  I take that as a sign that he is coming out of his grief.  Related to the loss of his son.  Review of Systems:  Review of Systems  Constitutional: Negative.   HENT: Negative.    Eyes: Negative.   Respiratory: Negative.    Cardiovascular: Negative.   Gastrointestinal: Negative.   Musculoskeletal: Negative.   Skin: Negative.   Neurological: Negative.   Psychiatric/Behavioral: Negative.    All other systems reviewed and are negative.  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   Inguinal hernia without mention of obstruction or gangrene, unilateral or unspecified, (not specified as recurrent) 11/20/2008   Insomnia, unspecified  11/01/2003   Kidney calculi 08/20/2009   Other and unspecified hyperlipidemia 12/23/2004   Other malaise and fatigue 12/23/2004   Pain in joint, pelvic region and thigh 12/17/2009   Pain in joint, shoulder region 11/01/2003   Peptic ulcer, unspecified site, unspecified as acute or chronic, without mention of hemorrhage, perforation, or obstruction 06/25/2003   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 Lennie Hummer   HERNIA REPAIR Bilateral 11/2008   inguinal   PILONIDAL CYST EXCISION  1963   PROSTATE SURGERY     TOTAL HIP ARTHROPLASTY Right 07/10/2009   Dr. Telford Nab   Social History:   reports that he quit smoking about 42 years ago. He has never used smokeless tobacco. He reports current alcohol use. He reports that he does not use drugs.  Family History  Problem Relation Age of Onset   Heart disease Father     Medications: Patient's Medications  New Prescriptions   No medications on file  Previous Medications   ACETAMINOPHEN (TYLENOL) 325 MG TABLET    Take 2 tablets (650 mg total) by mouth every  6 (six) hours as needed for mild pain (or Fever >/= 101).   ALPRAZOLAM (XANAX) 0.25 MG TABLET    Take 1 tablet (0.25 mg total) by mouth 3 (three) times daily.   AMLODIPINE (NORVASC) 5 MG TABLET    Take one tablet by mouth once daily.   ASPIRIN 81 MG TABLET    Take 1 tablet (81 mg total) by mouth daily.   CHOLECALCIFEROL (VITAMIN D PO)    Take 1,000 Units by mouth daily.    LEVOTHYROXINE (SYNTHROID) 75 MCG TABLET    Take 1 tablet (75 mcg  total) by mouth daily before breakfast.   LOSARTAN (COZAAR) 100 MG TABLET    TAKE 1 TABLET(100 MG) BY MOUTH DAILY   NITROGLYCERIN (NITROSTAT) 0.4 MG SL TABLET    Place 1 tablet (0.4 mg total) under the tongue every 5 (five) minutes as needed for chest pain.   ONETOUCH DELICA LANCETS 10F MISC    Check blood sugar once daily as directed E11.22   ONETOUCH ULTRA TEST STRIP    USE TO CHECK BLOOD SUGAR ONCE DAILY AND AS DIRECTED   SIMVASTATIN (ZOCOR) 20 MG TABLET    Take one tablet by mouth once daily at bedtime.   TEMAZEPAM (RESTORIL) 15 MG CAPSULE    TAKE 1 CAPSULE BY MOUTH AT BEDTIME AS NEEDED FOR SLEEP   TRIAMCINOLONE (KENALOG) 0.1 %    APPLY EXTERNALLY TO RASH TWICE DAILY AS NEEDED  Modified Medications   No medications on file  Discontinued Medications   No medications on file    Physical Exam:  Vitals:   09/23/20 1322  Weight: 150 lb (68 kg)  Height: 5\' 5"  (1.651 m)   Body mass index is 24.96 kg/m. Wt Readings from Last 3 Encounters:  09/23/20 150 lb (68 kg)  03/24/20 155 lb 6.4 oz (70.5 kg)  12/18/19 153 lb (69.4 kg)    Physical Exam Vitals and nursing note reviewed.  Constitutional:      Appearance: Normal appearance.  HENT:     Head: Normocephalic.  Cardiovascular:     Rate and Rhythm: Normal rate and regular rhythm.  Pulmonary:     Effort: Pulmonary effort is normal.     Breath sounds: Normal breath sounds.  Musculoskeletal:        General: Normal range of motion.     Cervical back: Normal range of motion.  Skin:    General: Skin is warm.  Neurological:     General: No focal deficit present.     Mental Status: He is alert and oriented to person, place, and time.  Psychiatric:        Mood and Affect: Mood normal.        Behavior: Behavior normal.        Thought Content: Thought content normal.        Judgment: Judgment normal.    Labs reviewed: Basic Metabolic Panel: Recent Labs    11/01/19 0821 12/18/19 1139 03/19/20 0807 09/17/20 0809  NA  --  140  139 141  K  --  4.3 4.3 4.0  CL  --  104 105 109  CO2  --  23 26 24   GLUCOSE  --  92 87 87  BUN  --  22 23 26*  CREATININE  --  1.17 1.13* 1.12*  CALCIUM  --  9.4 9.2 9.4  TSH 1.78  --  1.81 1.94   Liver Function Tests: Recent Labs    12/18/19 1139 03/19/20 0807 09/17/20 0809  AST 13 12 12   ALT 10 7* 8*  ALKPHOS 69  --   --   BILITOT 0.5 0.7 0.6  PROT 6.5 6.3 6.4  ALBUMIN 4.4  --   --    No results for input(s): LIPASE, AMYLASE in the last 8760 hours. No results for input(s): AMMONIA in the last 8760 hours. CBC: Recent Labs    03/19/20 0807 09/17/20 0809  WBC 5.9 6.3  NEUTROABS 3,280 3,528  HGB 14.2 13.9  HCT 42.5 42.3  MCV 89.1 90.8  PLT 162 158   Lipid Panel: Recent Labs    09/17/20 0809  CHOL 158  HDL 47  LDLCALC 96  TRIG 64  CHOLHDL 3.4   TSH: Recent Labs    11/01/19 0821 03/19/20 0807 09/17/20 0809  TSH 1.78 1.81 1.94   A1C: Lab Results  Component Value Date   HGBA1C 5.8 (H) 09/17/2020     Assessment/Plan  1. Other specified hypothyroidism TSH done last week was in therapeutic range at 1.94.  He continues on levothyroxine 75 mcg  2. Hyperlipidemia, unspecified hyperlipidemia type Lipid levels show LDL of 96 on simvastatin  3. Bell's palsy Continues with some ptosis of left lid  4. Type 2 diabetes mellitus with stage 3a chronic kidney disease, without long-term current use of insulin ([place]) Does not really have diabetes but more correctly prediabetes.  A1c is continue to support that diagnosis with number last week at 5.8  5. Vertigo Last for 1 or 2 or 3 days at a time occurs intermittently.  We spent some time talking about inner ear issues and I gave him take-home instructions to try Epley's maneuver to see if that might help   Alain Honey, MD [place] [phone]

## 2020-09-24 ENCOUNTER — Ambulatory Visit: Payer: Medicare Other | Admitting: Family Medicine

## 2020-09-25 ENCOUNTER — Other Ambulatory Visit: Payer: Self-pay | Admitting: *Deleted

## 2020-09-25 DIAGNOSIS — G4709 Other insomnia: Secondary | ICD-10-CM

## 2020-09-25 MED ORDER — ALPRAZOLAM 0.25 MG PO TABS: 0.2500 mg | ORAL_TABLET | Freq: Three times a day (TID) | ORAL | 1 refills | Status: DC

## 2020-09-25 MED ORDER — TEMAZEPAM 15 MG PO CAPS: ORAL_CAPSULE | ORAL | 0 refills | Status: DC

## 2020-09-25 NOTE — Telephone Encounter (Signed)
Patient called for refill.  Pended Rx and sent to [facility] ,due to Dr. Sabra Heck out of office, for approval due to [place].

## 2020-09-25 NOTE — Telephone Encounter (Signed)
Pharmacy requested refill [facility]: 03/24/2020 (patient get 90 day supply) Needs contract signed, added note to upcoming appointment Pended Rx and sent to [place] for approval due to Dr. Sabra Heck out of office.

## 2020-11-18 ENCOUNTER — Other Ambulatory Visit: Payer: Self-pay | Admitting: *Deleted

## 2020-11-18 MED ORDER — LOSARTAN POTASSIUM 100 MG PO TABS
ORAL_TABLET | ORAL | 1 refills | Status: DC
Start: 2020-11-18 — End: 2021-11-12

## 2020-11-18 NOTE — Telephone Encounter (Signed)
Pharmacy requested refill

## 2020-12-24 ENCOUNTER — Other Ambulatory Visit: Payer: Self-pay | Admitting: Nurse Practitioner

## 2020-12-24 DIAGNOSIS — G4709 Other insomnia: Secondary | ICD-10-CM

## 2021-03-18 ENCOUNTER — Other Ambulatory Visit: Payer: Self-pay | Admitting: Family

## 2021-04-02 ENCOUNTER — Other Ambulatory Visit: Payer: Self-pay

## 2021-04-02 ENCOUNTER — Other Ambulatory Visit: Payer: Medicare Other

## 2021-04-02 DIAGNOSIS — I10 Essential (primary) hypertension: Secondary | ICD-10-CM

## 2021-04-02 DIAGNOSIS — N1831 Chronic kidney disease, stage 3a: Secondary | ICD-10-CM

## 2021-04-02 DIAGNOSIS — E1122 Type 2 diabetes mellitus with diabetic chronic kidney disease: Secondary | ICD-10-CM | POA: Diagnosis not present

## 2021-04-03 LAB — COMPLETE METABOLIC PANEL WITH GFR
AG Ratio: 1.8 (calc) (ref 1.0–2.5)
ALT: 6 U/L — ABNORMAL LOW (ref 9–46)
AST: 11 U/L (ref 10–35)
Albumin: 4.2 g/dL (ref 3.6–5.1)
Alkaline phosphatase (APISO): 61 U/L (ref 35–144)
BUN: 24 mg/dL (ref 7–25)
CO2: 27 mmol/L (ref 20–32)
Calcium: 9.1 mg/dL (ref 8.6–10.3)
Chloride: 105 mmol/L (ref 98–110)
Creat: 1.12 mg/dL (ref 0.70–1.22)
Globulin: 2.3 g/dL (calc) (ref 1.9–3.7)
Glucose, Bld: 92 mg/dL (ref 65–99)
Potassium: 4 mmol/L (ref 3.5–5.3)
Sodium: 138 mmol/L (ref 135–146)
Total Bilirubin: 0.9 mg/dL (ref 0.2–1.2)
Total Protein: 6.5 g/dL (ref 6.1–8.1)
eGFR: 62 mL/min/{1.73_m2} (ref >=60)

## 2021-04-03 LAB — HEMOGLOBIN A1C
Hgb A1c MFr Bld: 5.9 % of total Hgb — ABNORMAL HIGH (ref <5.7)
Mean Plasma Glucose: 123 mg/dL
eAG (mmol/L): 6.8 mmol/L

## 2021-04-07 ENCOUNTER — Encounter: Payer: Self-pay | Admitting: Family Medicine

## 2021-04-07 ENCOUNTER — Other Ambulatory Visit: Payer: Self-pay

## 2021-04-07 ENCOUNTER — Ambulatory Visit (INDEPENDENT_AMBULATORY_CARE_PROVIDER_SITE_OTHER): Payer: Medicare Other | Admitting: Family Medicine

## 2021-04-07 VITALS — BP 130/80 | HR 61 | Temp 96.8°F | Resp 16 | Ht 65.0 in | Wt 145.6 lb

## 2021-04-07 DIAGNOSIS — I10 Essential (primary) hypertension: Secondary | ICD-10-CM

## 2021-04-07 DIAGNOSIS — E1122 Type 2 diabetes mellitus with diabetic chronic kidney disease: Secondary | ICD-10-CM

## 2021-04-07 DIAGNOSIS — G510 Bell's palsy: Secondary | ICD-10-CM

## 2021-04-07 DIAGNOSIS — F411 Generalized anxiety disorder: Secondary | ICD-10-CM

## 2021-04-07 DIAGNOSIS — E785 Hyperlipidemia, unspecified: Secondary | ICD-10-CM

## 2021-04-07 DIAGNOSIS — G8929 Other chronic pain: Secondary | ICD-10-CM

## 2021-04-07 DIAGNOSIS — M25512 Pain in left shoulder: Secondary | ICD-10-CM

## 2021-04-07 DIAGNOSIS — N1831 Chronic kidney disease, stage 3a: Secondary | ICD-10-CM | POA: Diagnosis not present

## 2021-04-07 DIAGNOSIS — E039 Hypothyroidism, unspecified: Secondary | ICD-10-CM

## 2021-04-07 MED ORDER — NITROGLYCERIN 0.1 MG/HR TD PT24: 0.2000 mg | MEDICATED_PATCH | Freq: Every day | TRANSDERMAL | Status: DC

## 2021-04-07 NOTE — Progress Notes (Signed)
Provider:  Alain Honey, MD  Careteam: Patient Care Team: [name], MD as PCP - General (Family Medicine) [name], MD as Consulting Physician (Ophthalmology)  PLACE OF SERVICE:  [place] Directive information Does Patient Have a Medical Advance Directive?: Yes  No Known Allergies  Chief Complaint  Patient presents with   Medical Management of Chronic Issues    Patient presents today for a 6 month follow0up.   Quality Metric Gaps    Eye& foot exam, pneumonia, Flu, COVID booster     HPI: Patient is a 88 y.o. male .  Patient is here for 50-month follow-up for medical management of chronic problems including prediabetes with chronic kidney disease. He has no new complaints today.  He continues to have some anxiety that seem to be mostly related to care of his wife who had a small stroke several months ago.  He continues with dizziness that seems to be worse as it relates to anxiety. Appetite continues to be good. There have been no falls.  He still maintains that he is anxious to get out and play golf again and a senior group  Review of Systems:  Review of Systems  Constitutional:  Positive for weight loss.  Respiratory: Negative.    Cardiovascular: Negative.   Musculoskeletal:  Positive for joint pain.  Neurological:  Positive for dizziness.  Psychiatric/Behavioral:  The patient is nervous/anxious.    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   Inguinal hernia without mention of  obstruction or gangrene, unilateral or unspecified, (not specified as recurrent) 11/20/2008   Insomnia, unspecified 11/01/2003   Kidney calculi 08/20/2009   Other and unspecified hyperlipidemia 12/23/2004   Other malaise and fatigue 12/23/2004   Pain in joint, pelvic region and thigh 12/17/2009   Pain in joint, shoulder region 11/01/2003   Peptic ulcer, unspecified site, unspecified as acute or chronic, without mention of hemorrhage, perforation, or obstruction 06/25/2003   Personal history of malaria 03/16/1939   Right rotator cuff tear 07/10/2018   Chronic per imaging 4/20   Right shoulder injury 07/10/2018   Rosacea 08/20/2009   SDH (subdural hematoma)    Type II or unspecified type diabetes mellitus without mention of complication, not stated as uncontrolled 07/15/2006   Unspecified constipation 08/05/2010   Unspecified hypothyroidism 12/17/2009   Past Surgical History:  Procedure Laterality Date   APPENDECTOMY  1956   HEMORROIDECTOMY  06/2002   Dr. Lennie Hummer   HERNIA REPAIR Bilateral 11/2008   inguinal   PILONIDAL CYST EXCISION  1963   PROSTATE SURGERY     TOTAL HIP ARTHROPLASTY Right 07/10/2009   Dr. Telford Nab   Social History:   reports that he quit smoking about 42 years ago. His smoking use included cigarettes. He has never used smokeless tobacco. He reports current alcohol use. He reports that he does not use drugs.  Family History  Problem Relation Age of Onset   Heart disease Father     Medications: Patient's Medications  New Prescriptions   No medications on file  Previous Medications  ACETAMINOPHEN (TYLENOL) 325 MG TABLET    Take 2 tablets (650 mg total) by mouth every 6 (six) hours as needed for mild pain (or Fever >/= 101).   ALPRAZOLAM (XANAX) 0.25 MG TABLET    TAKE 1 TABLET(0.25 MG) BY MOUTH THREE TIMES DAILY   AMLODIPINE (NORVASC) 5 MG TABLET    Take one tablet by mouth once daily.   ASPIRIN 81 MG TABLET    Take 1 tablet (81 mg total) by mouth daily.    CHOLECALCIFEROL (VITAMIN D PO)    Take 1,000 Units by mouth daily.    LEVOTHYROXINE (SYNTHROID) 75 MCG TABLET    Take 1 tablet (75 mcg total) by mouth daily before breakfast.   LOSARTAN (COZAAR) 100 MG TABLET    Take one tablet by mouth once daily.   NITROGLYCERIN (NITROSTAT) 0.4 MG SL TABLET    Place 1 tablet (0.4 mg total) under the tongue every 5 (five) minutes as needed for chest pain.   ONETOUCH DELICA LANCETS 16X MISC    Check blood sugar once daily as directed E11.22   ONETOUCH ULTRA TEST STRIP    USE TO CHECK BLOOD SUGAR ONCE DAILY AND AS DIRECTED   SIMVASTATIN (ZOCOR) 20 MG TABLET    Take one tablet by mouth once daily at bedtime.   TEMAZEPAM (RESTORIL) 15 MG CAPSULE    TAKE 1 CAPSULE BY MOUTH AT BEDTIME AS NEEDED FOR SLEEP   TRIAMCINOLONE (KENALOG) 0.1 %    APPLY EXTERNALLY TO RASH TWICE DAILY AS NEEDED  Modified Medications   No medications on file  Discontinued Medications   No medications on file    Physical Exam:  There were no vitals filed for this visit. There is no height or weight on file to calculate BMI. Wt Readings from Last 3 Encounters:  09/23/20 150 lb (68 kg)  03/24/20 155 lb 6.4 oz (70.5 kg)  12/18/19 153 lb (69.4 kg)    Physical Exam Vitals and nursing note reviewed.  Constitutional:      Appearance: Normal appearance.  Cardiovascular:     Rate and Rhythm: Normal rate and regular rhythm.  Pulmonary:     Effort: Pulmonary effort is normal.     Breath sounds: Normal breath sounds.  Musculoskeletal:     Comments: Right shoulder: No deformity decrease abduction and internal rotation  Neurological:     General: No focal deficit present.     Mental Status: He is alert and oriented to person, place, and time.    Labs reviewed: Basic Metabolic Panel: Recent Labs    09/17/20 0809 04/02/21 0826  NA 141 138  K 4.0 4.0  CL 109 105  CO2 24 27  GLUCOSE 87 92  BUN 26* 24  CREATININE 1.12* 1.12  CALCIUM 9.4 9.1  TSH 1.94  --    Liver Function  Tests: Recent Labs    09/17/20 0809 04/02/21 0826  AST 12 11  ALT 8* 6*  BILITOT 0.6 0.9  PROT 6.4 6.5   No results for input(s): LIPASE, AMYLASE in the last 8760 hours. No results for input(s): AMMONIA in the last 8760 hours. CBC: Recent Labs    09/17/20 0809  WBC 6.3  NEUTROABS 3,528  HGB 13.9  HCT 42.3  MCV 90.8  PLT 158   Lipid Panel: Recent Labs    09/17/20 0809  CHOL 158  HDL 47  LDLCALC 96  TRIG 64  CHOLHDL 3.4   TSH: Recent Labs    09/17/20 0809  TSH  1.94   A1C: Lab Results  Component Value Date   HGBA1C 5.9 (H) 04/02/2021     Assessment/Plan  1. Essential hypertension Blood pressure good at 130/80 on losartan and amlodipine  2. Hyperlipidemia, unspecified hyperlipidemia type He continues on simvastatin with LDL 96  3. Acquired hypothyroidism TSH was normal 6 months ago we will plan to check at next visit  4. Type 2 diabetes mellitus with stage 3a chronic kidney disease, without long-term current use of insulin (HCC) A1c is about the same as 6 months ago.  He had been diabetic previously but now is more correctly prediabetic he is on no medication  5. Chronic left shoulder pain He seemed interested in trying nitroglycerin patch for a finite time to see if this might help his pain  6. Anxiety state Continues to take alprazolam as needed  7. Bell's palsy Patient has chronic left ptosis secondary to Bell's palsy.  No change   Alain Honey, MD [place] [phone]

## 2021-04-11 NOTE — Progress Notes (Deleted)
Cardiology Office Note:    Date:  04/11/2021   ID:  Bobby Jensen, DOB [birth year], MRN [id number]  PCP:  [name], MD   [facility] Providers Cardiologist:  None {    Referring MD: [name], MD     History of Present Illness:    Bobby Jensen is a 88 y.o. male with a hx of HTN, DMII, HLD who was referred by Dr. Sabra Heck to re-establish care.  Was last seen by Dr. Meda Coffee in 2018 where he was doing well. Retired Social research officer, government. Myoview showed scar in the inferolateral wall with mild peri-infarct ischemia. Given lack of symptoms, medications were uptitrated.  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   Inguinal hernia without mention of obstruction or gangrene, unilateral or unspecified, (not specified as recurrent) 11/20/2008   Insomnia, unspecified 11/01/2003   Kidney calculi 08/20/2009   Other and unspecified hyperlipidemia 12/23/2004   Other malaise and fatigue 12/23/2004   Pain in joint, pelvic region and thigh 12/17/2009   Pain in joint, shoulder region 11/01/2003   Peptic ulcer, unspecified site, unspecified as acute or chronic, without mention of hemorrhage, perforation, or obstruction 06/25/2003   Personal history of malaria 03/16/1939   Right rotator cuff tear 07/10/2018   Chronic per imaging 4/20   Right shoulder injury 07/10/2018   Rosacea 08/20/2009   SDH (subdural hematoma)    Type II or unspecified type diabetes mellitus without mention of complication, not stated  as uncontrolled 07/15/2006   Unspecified constipation 08/05/2010   Unspecified hypothyroidism 12/17/2009    Past Surgical History:  Procedure Laterality Date   APPENDECTOMY  1956   HEMORROIDECTOMY  06/2002   Dr. Lennie Hummer   HERNIA REPAIR Bilateral 11/2008   inguinal   PILONIDAL CYST EXCISION  1963   PROSTATE SURGERY     TOTAL HIP ARTHROPLASTY Right 07/10/2009   Dr. Telford Nab    Current Medications: No outpatient medications have been marked as taking for the 04/15/21 encounter (Appointment) with Freada Bergeron, MD.   Current Facility-Administered Medications for the 04/15/21 encounter (Appointment) with Freada Bergeron, MD  Medication   nitroGLYCERIN (NITRODUR - Dosed in mg/24 hr) patch 0.2 mg     Allergies:   Patient has no known allergies.   Social History   Socioeconomic History   Marital status: Married    Spouse name: Not on file   Number of children: Not on file   Years of education: Not on file   Highest education level: Not on file  Occupational History   Not on file  Tobacco Use   Smoking status: Former    Types: Cigarettes    Quit date: 07/31/1978    Years since quitting: 42.7   Smokeless tobacco: Never  Substance and Sexual Activity   Alcohol use: Yes    Comment: Occ. glass of wine   Drug use: No    Comment: Quit in 1980   Sexual activity: Yes    Partners: Female  Other Topics Concern   Not on file  Social History  Narrative   Lives at home with his wife    Social Determinants of Health   Financial Resource Strain: Not on file  Food Insecurity: Not on file  Transportation Needs: Not on file  Physical Activity: Not on file  Stress: Not on file  Social Connections: Not on file     Family History: The patient's ***family history includes Heart disease in his father.  ROS:   Please see the history of present illness.    *** All other systems reviewed and are negative.  EKGs/Labs/Other Studies Reviewed:    The following studies were  reviewed today: Nuclear stress test: 08/31/2013 Impression Exercise Capacity:  Lexiscan with low level exercise. BP Response:  Normal blood pressure response. Clinical Symptoms:  Chest pain ECG Impression:  No significant ST segment change suggestive of ischemia. Comparison with Prior Nuclear Study: No images to compare   Overall Impression:  This is a low/medium risk scan. There is scar affecting the entire inferolateral wall and the mid/apical anterolateral wall. There is peri-infarct ischemia at the base of the anterolateral wall.   LV Ejection Fraction: 63%.  LV Wall Motion:  Wall motion is good overall. There is decreased motion in the lateral wall.   Dola Argyle, MD  EKG:  EKG is *** ordered today.  The ekg ordered today demonstrates ***  Recent Labs: 09/17/2020: Hemoglobin 13.9; Platelets 158; TSH 1.94 04/02/2021: ALT 6; BUN 24; Creat 1.12; Potassium 4.0; Sodium 138  Recent Lipid Panel    Component Value Date/Time   CHOL 158 09/17/2020 0809   CHOL 158 07/25/2015 0825   TRIG 64 09/17/2020 0809   HDL 47 09/17/2020 0809   HDL 54 07/25/2015 0825   CHOLHDL 3.4 09/17/2020 0809   VLDL 13 06/16/2016 0833   LDLCALC 96 09/17/2020 0809     Risk Assessment/Calculations:   {Does this patient have ATRIAL FIBRILLATION?:[id number]}       Physical Exam:    VS:  There were no vitals taken for this visit.    Wt Readings from Last 3 Encounters:  04/07/21 145 lb 9.6 oz (66 kg)  09/23/20 150 lb (68 kg)  03/24/20 155 lb 6.4 oz (70.5 kg)     GEN: *** Well nourished, well developed in no acute distress HEENT: Normal NECK: No JVD; No carotid bruits LYMPHATICS: No lymphadenopathy CARDIAC: ***RRR, no murmurs, rubs, gallops RESPIRATORY:  Clear to auscultation without rales, wheezing or rhonchi  ABDOMEN: Soft, non-tender, non-distended MUSCULOSKELETAL:  No edema; No deformity  SKIN: Warm and dry NEUROLOGIC:  Alert and oriented x 3 PSYCHIATRIC:  Normal affect   ASSESSMENT:    No  diagnosis found. PLAN:    In order of problems listed above:  #CAD: Myoview with inferolateral scar. No symptoms. On medical management. -Continue ASA 81mg  daily -Continue simvastatin 20mg  daily -Continue losartan 100mg  daily  #HTN: -Continue losartan 100mg  daily -Continue amlodipine 5mg  daily  #HLD: -Continue simvastatin 20mg  daily      {Are you ordering a CV Procedure (e.g. stress test, cath, DCCV, TEE, etc)?   Press F2        :[id number]}    Medication Adjustments/Labs and Tests Ordered: Current medicines are reviewed at length with the patient today.  Concerns regarding medicines are outlined above.  No orders of the defined types were placed in this encounter.  No orders of the defined types were placed in this encounter.   There are no Patient Instructions on file for this visit.   Signed, Freada Bergeron, MD  04/11/2021 9:06 PM    [facility]

## 2021-04-15 ENCOUNTER — Other Ambulatory Visit: Payer: Self-pay

## 2021-04-15 ENCOUNTER — Ambulatory Visit (INDEPENDENT_AMBULATORY_CARE_PROVIDER_SITE_OTHER): Payer: Medicare Other

## 2021-04-15 ENCOUNTER — Encounter: Payer: Self-pay | Admitting: Cardiology

## 2021-04-15 ENCOUNTER — Ambulatory Visit: Payer: Medicare Other | Admitting: Cardiology

## 2021-04-15 VITALS — BP 124/70 | HR 53 | Ht 65.0 in | Wt 146.0 lb

## 2021-04-15 DIAGNOSIS — I493 Ventricular premature depolarization: Secondary | ICD-10-CM | POA: Diagnosis not present

## 2021-04-15 DIAGNOSIS — E1122 Type 2 diabetes mellitus with diabetic chronic kidney disease: Secondary | ICD-10-CM

## 2021-04-15 DIAGNOSIS — N1831 Chronic kidney disease, stage 3a: Secondary | ICD-10-CM | POA: Diagnosis not present

## 2021-04-15 DIAGNOSIS — R002 Palpitations: Secondary | ICD-10-CM

## 2021-04-15 DIAGNOSIS — E1169 Type 2 diabetes mellitus with other specified complication: Secondary | ICD-10-CM | POA: Diagnosis not present

## 2021-04-15 DIAGNOSIS — R079 Chest pain, unspecified: Secondary | ICD-10-CM | POA: Diagnosis not present

## 2021-04-15 DIAGNOSIS — I25118 Atherosclerotic heart disease of native coronary artery with other forms of angina pectoris: Secondary | ICD-10-CM | POA: Diagnosis not present

## 2021-04-15 DIAGNOSIS — I10 Essential (primary) hypertension: Secondary | ICD-10-CM | POA: Diagnosis not present

## 2021-04-15 DIAGNOSIS — E785 Hyperlipidemia, unspecified: Secondary | ICD-10-CM

## 2021-04-15 MED ORDER — ISOSORBIDE MONONITRATE ER 30 MG PO TB24: 15.0000 mg | ORAL_TABLET | Freq: Every day | ORAL | 3 refills | Status: DC

## 2021-04-15 MED ORDER — ROSUVASTATIN CALCIUM 20 MG PO TABS: 20.0000 mg | ORAL_TABLET | Freq: Every day | ORAL | 1 refills | Status: DC

## 2021-04-15 NOTE — Progress Notes (Unsigned)
Enrolled for [employer] to mail a ZIO XT long term holter monitor to the patients address on file.  

## 2021-04-15 NOTE — Patient Instructions (Addendum)
Medication Instructions:   STOP TAKING SIMVASTATIN NOW  START TAKING ROSUVASTATIN (CRESTOR) 20 MG BY MOUTH DAILY  START TAKING ISOSORBIDE MONONITRATE (IMDUR) 15 MG BY MOUTH DAILY  *If you need a refill on your cardiac medications before your next appointment, please call your pharmacy*   Testing/Procedures:  Your physician has requested that you have an echocardiogram. Echocardiography is a painless test that uses sound waves to create images of your heart. It provides your doctor with information about the size and shape of your heart and how well your hearts chambers and valves are working. This procedure takes approximately one hour. There are no restrictions for this procedure.  ZIO XT- Long Term Monitor Instructions  Your physician has requested you wear a ZIO patch monitor for 3 days.  This is a single patch monitor. [employer] supplies one patch monitor per enrollment. Additional stickers are not available. Please do not apply patch if you will be having a Nuclear Stress Test,  Echocardiogram, Cardiac CT, MRI, or Chest Xray during the period you would be wearing the  monitor. The patch cannot be worn during these tests. You cannot remove and re-apply the  ZIO XT patch monitor.  Your ZIO patch monitor will be mailed 3 day USPS to your address on file. It may take 3-5 days  to receive your monitor after you have been enrolled.  Once you have received your monitor, please review the enclosed instructions. Your monitor  has already been registered assigning a specific monitor serial # to you.  Billing and Patient Assistance Program Information  We have supplied [employer] with any of your insurance information on file for billing purposes. [employer] offers a sliding scale Patient Assistance Program for patients that do not have  insurance, or whose insurance does not completely cover the cost of the ZIO monitor.  You must apply for the Patient Assistance Program to qualify for this  discounted rate.  To apply, please call [employer] at [phone], select option 4, select option 2, ask to apply for  Patient Assistance Program. Theodore Demark will ask your household income, and how many people  are in your household. They will quote your out-of-pocket cost based on that information.  [employer] will also be able to set up a 84-month, interest-free payment plan if needed.  Applying the monitor   Shave hair from upper left chest.  Hold abrader disc by orange tab. Rub abrader in 40 strokes over the upper left chest as  indicated in your monitor instructions.  Clean area with 4 enclosed alcohol pads. Let dry.  Apply patch as indicated in monitor instructions. Patch will be placed under collarbone on left  side of chest with arrow pointing upward.  Rub patch adhesive wings for 2 minutes. Remove white label marked "1". Remove the white  label marked "2". Rub patch adhesive wings for 2 additional minutes.  While looking in a mirror, press and release button in center of patch. A small green light will  flash 3-4 times. This will be your only indicator that the monitor has been turned on.  Do not shower for the first 24 hours. You may shower after the first 24 hours.  Press the button if you feel a symptom. You will hear a small click. Record Date, Time and  Symptom in the Patient Logbook.  When you are ready to remove the patch, follow instructions on the last 2 pages of Patient  Logbook. Stick patch monitor onto the last page of Patient Logbook.  Place  Patient Logbook in the blue and white box. Use locking tab on box and tape box closed  securely. The blue and white box has prepaid postage on it. Please place it in the mailbox as  soon as possible. Your physician should have your test results approximately 7 days after the  monitor has been mailed back to [facility].  Call [facility] at [phone] if you have questions regarding  your ZIO XT patch monitor. Call  them immediately if you see an orange light blinking on your  monitor.  If your monitor falls off in less than 4 days, contact our Monitor department at [phone].  If your monitor becomes loose or falls off after 4 days call [employer] at [phone] for  suggestions on securing your monitor   Follow-Up:  WITH DR. Johney Frame ON 05/12/21 AT HER 10:20 AM END SLOT--OK TO ADD PER DR. Johney Frame

## 2021-04-15 NOTE — Progress Notes (Signed)
Cardiology Office Note:    Date:  04/15/2021   ID:  Bobby Jensen, DOB 1927/03/16, MRN [id number]  PCP:  [name], MD   [facility] Providers Cardiologist:  None {   Referring MD: [name], MD    History of Present Illness:    Bobby Jensen is a 88 y.o. male with a hx of HTN, DMII, HLD who was referred by Dr. Sabra Heck to re-establish care.  Was last seen by Dr. Meda Coffee in 2018 where he was doing well. Retired Social research officer, government. Myoview showed scar in the inferolateral wall with mild peri-infarct ischemia. Given lack of symptoms, medications were uptitrated.  Today, the patient states he is feeling okay. He has become the primary caretaker of his wife who suffered from a stroke from underlying Afib. Since caring for her, he has noticed substernal chest tightness with activity that resolves with rest. No associated SOB. Has occasional lightheadedness but no syncope. We discussed the options of medical management, stress testing and cath and he would like to start with medications first and take it "one step at a time as he is 88 years old."  He denies any palpitations, or shortness of breath. No headaches, syncope, orthopnea, PND, or lower extremity edema.   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   Inguinal hernia without mention of obstruction or gangrene, unilateral or unspecified, (not specified as recurrent) 11/20/2008   Insomnia, unspecified 11/01/2003   Kidney calculi 08/20/2009   Other and  unspecified hyperlipidemia 12/23/2004   Other malaise and fatigue 12/23/2004   Pain in joint, pelvic region and thigh 12/17/2009   Pain in joint, shoulder region 11/01/2003   Peptic ulcer, unspecified site, unspecified as acute or chronic, without mention of hemorrhage, perforation, or obstruction 06/25/2003   Personal history of malaria 03/16/1939   Right rotator cuff tear 07/10/2018   Chronic per imaging 4/20   Right shoulder injury 07/10/2018   Rosacea 08/20/2009   SDH (subdural hematoma)    Type II or unspecified type diabetes mellitus without mention of complication, not stated as uncontrolled 07/15/2006   Unspecified constipation 08/05/2010   Unspecified hypothyroidism 12/17/2009    Past Surgical History:  Procedure Laterality Date   APPENDECTOMY  1956   HEMORROIDECTOMY  06/2002   Dr. Lennie Hummer   HERNIA REPAIR Bilateral 11/2008   inguinal   PILONIDAL CYST EXCISION  1963   PROSTATE SURGERY     TOTAL HIP ARTHROPLASTY Right 07/10/2009   Dr. Telford Nab    Current Medications: Current Meds  Medication Sig   acetaminophen (TYLENOL) 325 MG tablet Take 2 tablets (650 mg total) by mouth every 6 (six) hours as needed for mild pain (or Fever >/= 101).   ALPRAZolam (XANAX) 0.25 MG tablet TAKE 1 TABLET(0.25 MG) BY MOUTH THREE TIMES DAILY   amLODipine (NORVASC) 5 MG tablet Take one tablet by mouth once daily.   aspirin 81 MG tablet Take 1 tablet (81 mg total) by mouth daily.   Cholecalciferol (VITAMIN D PO) Take 1,000 Units by mouth daily.    isosorbide mononitrate (IMDUR)  30 MG 24 hr tablet Take 0.5 tablets (15 mg total) by mouth daily.   levothyroxine (SYNTHROID) 75 MCG tablet Take 1 tablet (75 mcg total) by mouth daily before breakfast.   losartan (COZAAR) 100 MG tablet Take one tablet by mouth once daily.   nitroGLYCERIN (NITROSTAT) 0.4 MG SL tablet Place 1 tablet (0.4 mg total) under the tongue every 5 (five) minutes as needed for chest pain.   OneTouch Delica Lancets 83T MISC Check  blood sugar once daily as directed E11.22   ONETOUCH ULTRA test strip USE TO CHECK BLOOD SUGAR ONCE DAILY AND AS DIRECTED   rosuvastatin (CRESTOR) 20 MG tablet Take 1 tablet (20 mg total) by mouth daily.   temazepam (RESTORIL) 15 MG capsule TAKE 1 CAPSULE BY MOUTH AT BEDTIME AS NEEDED FOR SLEEP   triamcinolone (KENALOG) 0.1 % APPLY EXTERNALLY TO RASH TWICE DAILY AS NEEDED   [DISCONTINUED] simvastatin (ZOCOR) 20 MG tablet Take one tablet by mouth once daily at bedtime.   Current Facility-Administered Medications for the 04/15/21 encounter (Office Visit) with Freada Bergeron, MD  Medication   nitroGLYCERIN (NITRODUR - Dosed in mg/24 hr) patch 0.2 mg     Allergies:   Patient has no known allergies.   Social History   Socioeconomic History   Marital status: Married    Spouse name: Not on file   Number of children: Not on file   Years of education: Not on file   Highest education level: Not on file  Occupational History   Not on file  Tobacco Use   Smoking status: Former    Types: Cigarettes    Quit date: 07/31/1978    Years since quitting: 42.7   Smokeless tobacco: Never  Substance and Sexual Activity   Alcohol use: Yes    Comment: Occ. glass of wine   Drug use: No    Comment: Quit in 1980   Sexual activity: Yes    Partners: Female  Other Topics Concern   Not on file  Social History Narrative   Lives at home with his wife    Social Determinants of Health   Financial Resource Strain: Not on file  Food Insecurity: Not on file  Transportation Needs: Not on file  Physical Activity: Not on file  Stress: Not on file  Social Connections: Not on file     Family History: The patient's family history includes Heart disease in his father.  ROS:   Review of Systems  Constitutional:  Negative for chills and diaphoresis.  HENT:  Negative for ear pain and nosebleeds.   Eyes:  Negative for photophobia and discharge.  Respiratory:  Negative for hemoptysis and sputum  production.   Cardiovascular:  Positive for chest pain. Negative for palpitations, orthopnea, claudication, leg swelling and PND.  Gastrointestinal:  Positive for nausea. Negative for vomiting.  Genitourinary:  Negative for dysuria and hematuria.  Musculoskeletal:  Negative for neck pain.  Neurological:  Negative for dizziness, tremors and loss of consciousness.  Endo/Heme/Allergies:  Negative for polydipsia.  Psychiatric/Behavioral:  Negative for substance abuse and suicidal ideas.     EKGs/Labs/Other Studies Reviewed:    The following studies were reviewed today:  Nuclear stress test: 08/31/2013 Impression Exercise Capacity:  Lexiscan with low level exercise. BP Response:  Normal blood pressure response. Clinical Symptoms:  Chest pain ECG Impression:  No significant ST segment change suggestive of ischemia. Comparison with Prior Nuclear Study: No images to compare   Overall Impression:  This is a low/medium risk scan.  There is scar affecting the entire inferolateral wall and the mid/apical anterolateral wall. There is peri-infarct ischemia at the base of the anterolateral wall.   LV Ejection Fraction: 63%.  LV Wall Motion:  Wall motion is good overall. There is decreased motion in the lateral wall.   Dola Argyle, MD  EKG:  EKG is personally reviewed.  04/15/2021: Sinus bradycardia. Frequent PVC's.   Recent Labs: 09/17/2020: Hemoglobin 13.9; Platelets 158; TSH 1.94 04/02/2021: ALT 6; BUN 24; Creat 1.12; Potassium 4.0; Sodium 138   Recent Lipid Panel    Component Value Date/Time   CHOL 158 09/17/2020 0809   CHOL 158 07/25/2015 0825   TRIG 64 09/17/2020 0809   HDL 47 09/17/2020 0809   HDL 54 07/25/2015 0825   CHOLHDL 3.4 09/17/2020 0809   VLDL 13 06/16/2016 0833   LDLCALC 96 09/17/2020 0809     Risk Assessment/Calculations:           Physical Exam:    VS:  BP 124/70 (BP Location: Left Arm, Patient Position: Sitting, Cuff Size: Normal)    Pulse (!) 53    Ht 5\' 5"   (1.651 m)    Wt 146 lb (66.2 kg)    SpO2 97%    BMI 24.30 kg/m     Wt Readings from Last 3 Encounters:  04/15/21 146 lb (66.2 kg)  04/07/21 145 lb 9.6 oz (66 kg)  09/23/20 150 lb (68 kg)     GEN: Well nourished, well developed in no acute distress HEENT: Normal NECK: No JVD; No carotid bruits LYMPHATICS: No lymphadenopathy CARDIAC: Bradycardic, regular rhythm, no murmurs, rubs, gallops RESPIRATORY:  Clear to auscultation without rales, wheezing or rhonchi  ABDOMEN: Soft, non-tender, non-distended MUSCULOSKELETAL:  No edema; No deformity  SKIN: Warm and dry NEUROLOGIC:  Alert and oriented x 3 PSYCHIATRIC:  Normal affect   ASSESSMENT:    1. Coronary artery disease of native artery of native heart with stable angina pectoris (HCC)   2. Palpitations   3. Chest pain of uncertain etiology   4. Essential hypertension   5. Type 2 diabetes mellitus with stage 3a chronic kidney disease, without long-term current use of insulin ([place])   6. Hyperlipidemia associated with type 2 diabetes mellitus ([place])   7. PVC's (premature ventricular contractions)    PLAN:    In order of problems listed above:  #CAD with stable angina Patient with exertional chest tightness that resolves with rest that has been ongoing for the past several months. Prior myoview in 2015 with inferolateral scar with mild periinfarct ischemia. We discussed the options of medication management, myoview and cath at length today and patient would like to start with medications at this time due to advanced age and come back in for re-evaluation to see how he is feeling. HR 50s and therefore cannot add nodal agent. Will start imdur and monitor response. -Declined ischemic evaluation at this time -Check TTE -Start imdur 15mg  daily -Cannot tolerate BB due to bradycardia -Continue ASA 81mg  daily -Change simvastatin to crestor 20mg  daily -Continue losartan 100mg  daily -Will check back in 1 month to see how symptoms are doing and  may consider ischemic evaluation at that time  #Frequent PVCs: Noted on ECG today. Declined ischemic evaluation as above. No BB due to bradycardia. -Check zio to assess burden -Check TTE -Declined ischemic evaluation -No BB due to bradycardia  #HTN: Controlled and at goal. -Continue losartan 100mg  daily -Continue amlodipine 5mg  daily  #HLD: -Change simva to crestor 20mg  daily  Follow-up: 2-3 weeks.  Medication Adjustments/Labs and Tests Ordered: Current medicines are reviewed at length with the patient today.  Concerns regarding medicines are outlined above.   Orders Placed This Encounter  Procedures   LONG TERM MONITOR (3-14 DAYS)   EKG 12-Lead   ECHOCARDIOGRAM COMPLETE   Meds ordered this encounter  Medications   isosorbide mononitrate (IMDUR) 30 MG 24 hr tablet    Sig: Take 0.5 tablets (15 mg total) by mouth daily.    Dispense:  45 tablet    Refill:  3   rosuvastatin (CRESTOR) 20 MG tablet    Sig: Take 1 tablet (20 mg total) by mouth daily.    Dispense:  90 tablet    Refill:  1   Patient Instructions  Medication Instructions:   STOP TAKING SIMVASTATIN NOW  START TAKING ROSUVASTATIN (CRESTOR) 20 MG BY MOUTH DAILY  START TAKING ISOSORBIDE MONONITRATE (IMDUR) 15 MG BY MOUTH DAILY  *If you need a refill on your cardiac medications before your next appointment, please call your pharmacy*   Testing/Procedures:  Your physician has requested that you have an echocardiogram. Echocardiography is a painless test that uses sound waves to create images of your heart. It provides your doctor with information about the size and shape of your heart and how well your hearts chambers and valves are working. This procedure takes approximately one hour. There are no restrictions for this procedure.  ZIO XT- Long Term Monitor Instructions  Your physician has requested you wear a ZIO patch monitor for 3 days.  This is a single patch monitor. [employer] supplies one patch  monitor per enrollment. Additional stickers are not available. Please do not apply patch if you will be having a Nuclear Stress Test,  Echocardiogram, Cardiac CT, MRI, or Chest Xray during the period you would be wearing the  monitor. The patch cannot be worn during these tests. You cannot remove and re-apply the  ZIO XT patch monitor.  Your ZIO patch monitor will be mailed 3 day USPS to your address on file. It may take 3-5 days  to receive your monitor after you have been enrolled.  Once you have received your monitor, please review the enclosed instructions. Your monitor  has already been registered assigning a specific monitor serial # to you.  Billing and Patient Assistance Program Information  We have supplied [employer] with any of your insurance information on file for billing purposes. [employer] offers a sliding scale Patient Assistance Program for patients that do not have  insurance, or whose insurance does not completely cover the cost of the ZIO monitor.  You must apply for the Patient Assistance Program to qualify for this discounted rate.  To apply, please call [employer] at [phone], select option 4, select option 2, ask to apply for  Patient Assistance Program. Theodore Demark will ask your household income, and how many people  are in your household. They will quote your out-of-pocket cost based on that information.  [employer] will also be able to set up a 52-month, interest-free payment plan if needed.  Applying the monitor   Shave hair from upper left chest.  Hold abrader disc by orange tab. Rub abrader in 40 strokes over the upper left chest as  indicated in your monitor instructions.  Clean area with 4 enclosed alcohol pads. Let dry.  Apply patch as indicated in monitor instructions. Patch will be placed under collarbone on left  side of chest with arrow pointing upward.  Rub patch adhesive wings for 2  minutes. Remove white label marked "1". Remove the white  label marked "2".  Rub patch adhesive wings for 2 additional minutes.  While looking in a mirror, press and release button in center of patch. A small green light will  flash 3-4 times. This will be your only indicator that the monitor has been turned on.  Do not shower for the first 24 hours. You may shower after the first 24 hours.  Press the button if you feel a symptom. You will hear a small click. Record Date, Time and  Symptom in the Patient Logbook.  When you are ready to remove the patch, follow instructions on the last 2 pages of Patient  Logbook. Stick patch monitor onto the last page of Patient Logbook.  Place Patient Logbook in the blue and white box. Use locking tab on box and tape box closed  securely. The blue and white box has prepaid postage on it. Please place it in the mailbox as  soon as possible. Your physician should have your test results approximately 7 days after the  monitor has been mailed back to [facility].  Call [employer] at [phone] if you have questions regarding  your ZIO XT patch monitor. Call them immediately if you see an orange light blinking on your  monitor.  If your monitor falls off in less than 4 days, contact our Monitor department at [phone].  If your monitor becomes loose or falls off after 4 days call [employer] at [phone] for  suggestions on securing your monitor   Follow-Up:  WITH DR. Johney Frame ON 05/12/21 AT HER 10:20 AM END SLOT--OK TO ADD PER DR. Johney Frame      I,Mathew Stumpf,acting as a scribe for Freada Bergeron, MD.,have documented all relevant documentation on the behalf of Freada Bergeron, MD,as directed by  Freada Bergeron, MD while in the presence of Freada Bergeron, MD.  I, Freada Bergeron, MD, have reviewed all documentation for this visit. The documentation on 04/15/21 for the exam, diagnosis, procedures, and orders are all accurate and complete.   Signed, Freada Bergeron, MD   04/15/2021 5:28 PM    [place]

## 2021-04-16 ENCOUNTER — Telehealth: Payer: Self-pay | Admitting: Cardiology

## 2021-04-16 NOTE — Telephone Encounter (Signed)
Pt was just calling to ask if he can still drive.  He saw Dr. Johney Frame yesterday in clinic. Informed the pt that Dr. Johney Frame did not restrict him from driving.  Advised him to proceed with all testing advised and make sure he starts taking new medications we started on him yesterday.  Advised him to go and pick those up and take those accordingly. Advised him to keep his follow-up as planned with [place].  Pt verbalized understanding and agrees with this plan.

## 2021-04-16 NOTE — Telephone Encounter (Signed)
Patient states he needs to talk to dr Johney Frame is very important. Please advise

## 2021-04-17 ENCOUNTER — Telehealth: Payer: Self-pay

## 2021-04-17 ENCOUNTER — Telehealth: Payer: Self-pay | Admitting: Cardiology

## 2021-04-17 DIAGNOSIS — I10 Essential (primary) hypertension: Secondary | ICD-10-CM

## 2021-04-17 MED ORDER — AMLODIPINE BESYLATE 5 MG PO TABS: ORAL_TABLET | ORAL | 3 refills | Status: DC

## 2021-04-17 NOTE — Telephone Encounter (Signed)
[facility] send a rx request for Amlodipine 5 MG tablets. Medication was send into pharmacy.

## 2021-04-17 NOTE — Telephone Encounter (Signed)
Yes dizziness is a common side effect.  Will include Dr Johney Frame because she had considered an ischemia evaluation if patient can not tolerate medications

## 2021-04-17 NOTE — Telephone Encounter (Signed)
Pt c/o medication issue:  1. Name of Medication: isosorbide mononitrate (IMDUR) 30 MG 24 hr tablet  2. How are you currently taking this medication (dosage and times per day)? As directed. Patient has only taken two doses  3. Are you having a reaction (difficulty breathing--STAT)? Severe headache, dizziness  4. What is your medication issue? Patient very concerned about side effects from medication   STAT if patient feels like he/she is going to faint   Are you dizzy now? yes  Do you feel faint or have you passed out? no  Do you have any other symptoms? Severe headache  Have you checked your HR and BP (record if available)?   HR 56. O2 98 patient has not taken BP

## 2021-04-17 NOTE — Telephone Encounter (Signed)
Called pt to f/u on medication side effects.  Pt reports extreme dizziness.  Pt rude when RN asked questions to assess condition.   RN asked for recent BP readings pt request to call back in 10 minutes to give time to find monitor and check BP.  RN will call back.   BP is 124/63.   Dizziness started after taking imdur 15 mg PO QD. Pt expresses he will not take imdur anymore.  Advised pt that I will route message to pharmacist as extreme dizziness is a possible side effect. Pt on imdur d/t CAD with stable angina.

## 2021-04-19 DIAGNOSIS — R002 Palpitations: Secondary | ICD-10-CM | POA: Diagnosis not present

## 2021-04-19 DIAGNOSIS — R079 Chest pain, unspecified: Secondary | ICD-10-CM | POA: Diagnosis not present

## 2021-04-19 NOTE — Telephone Encounter (Signed)
Agreed, very common side-effect. We can try changing to ranexa 500mg  BID to see if that helps symptoms but he may just need an ischemic evaluation given his symptoms and the fact that he is the primary caretaker for his wife.

## 2021-04-20 ENCOUNTER — Telehealth: Payer: Self-pay | Admitting: *Deleted
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
## 2021-04-20 MED ORDER — RANOLAZINE ER 500 MG PO TB12: 500.0000 mg | ORAL_TABLET | Freq: Two times a day (BID) | ORAL | 3 refills | Status: DC

## 2021-04-20 NOTE — Telephone Encounter (Signed)
Patient called and stated that the pharmacy has not received the Nitro Patch that you were going to send in for his Shoulder pain.   Requesting Rx to be sent to pharmacy.   Please Advise.    OV Dated 04/07/2021: 5. Chronic left shoulder pain He seemed interested in trying nitroglycerin patch for a finite time to see if this might help his pain

## 2021-04-20 NOTE — Telephone Encounter (Signed)
Spoke with the pt and made him aware of recommendations per Dr. Johney Frame. Pt states he wants to stop Imdur and try new alternative ranexa 500 mg po bid.  He will make [place] aware if his symptoms don't resolve on this new regimen. Pt will have his echo as scheduled on 2/10 and see Dr. Johney Frame in clinic on 2/28. Confirmed the pharmacy of choice with the pt.  Updated Imdur in his allergies as an intolerance, with complaints of dizziness and headache. Pt verbalized understanding and agrees with this plan.

## 2021-04-21 ENCOUNTER — Other Ambulatory Visit: Payer: Self-pay | Admitting: Family Medicine

## 2021-04-21 DIAGNOSIS — G8929 Other chronic pain: Secondary | ICD-10-CM

## 2021-04-21 MED ORDER — NITROGLYCERIN 0.1 MG/HR TD PT24: 0.2000 mg | MEDICATED_PATCH | Freq: Every day | TRANSDERMAL | Status: DC

## 2021-04-21 MED ORDER — NITROGLYCERIN 0.2 MG/HR TD PT24: MEDICATED_PATCH | TRANSDERMAL | 0 refills | Status: DC

## 2021-04-21 NOTE — Progress Notes (Signed)
Sent Rx again to pharmacy

## 2021-04-24 ENCOUNTER — Ambulatory Visit (HOSPITAL_COMMUNITY): Payer: Medicare Other | Attending: Cardiology

## 2021-04-24 ENCOUNTER — Other Ambulatory Visit: Payer: Self-pay

## 2021-04-24 DIAGNOSIS — R079 Chest pain, unspecified: Secondary | ICD-10-CM | POA: Diagnosis not present

## 2021-04-24 LAB — ECHOCARDIOGRAM COMPLETE
Area-P 1/2: 2.32 cm2
S' Lateral: 3.4 cm

## 2021-04-28 ENCOUNTER — Telehealth: Payer: Self-pay | Admitting: Cardiology

## 2021-04-28 ENCOUNTER — Other Ambulatory Visit: Payer: Self-pay | Admitting: *Deleted

## 2021-04-28 MED ORDER — NITROGLYCERIN 0.2 MG/HR TD PT24: MEDICATED_PATCH | TRANSDERMAL | 0 refills | Status: DC

## 2021-04-28 NOTE — Telephone Encounter (Signed)
Patient returned call for his test results.  

## 2021-04-28 NOTE — Telephone Encounter (Signed)
Nuala Alpha, LPN  06/15/7541  6:06 AM EST     The patient has been notified of the result and verbalized understanding.  All questions (if any) were answered.     Pt states he's feeling better.  Still mild dizziness with switch from Imdur to Ranexa, but much more improved since stopping imdur.  He states he has no chest pain at all.  Advised the pt to continue his current regimen and we will see him as planned for 05/12/21 in clinic. Pt verbalized understanding and agrees with this plan. Will send this message to Dr. Johney Frame as an Juluis Rainier.    Nuala Alpha, LPN  7/70/3403  5:24 AM EST     LATHAM, SARAH K   Left message for pt to call back    Ma Hillock  04/28/2021  8:05 AM EST     Left message for patient to call back for results.   Freada Bergeron, MD  04/27/2021  8:28 PM EST     His echo looks great and shows normal pumping function and mild leakiness of his mitral valve. How is he feeling?

## 2021-04-28 NOTE — Telephone Encounter (Signed)
Dr. Sabra Heck approved Rx with High Risk Warning but patient changed his mind about pharmacies and requesting it to go to [facility].  Rx Resent.

## 2021-04-29 DIAGNOSIS — R002 Palpitations: Secondary | ICD-10-CM | POA: Diagnosis not present

## 2021-04-29 DIAGNOSIS — R079 Chest pain, unspecified: Secondary | ICD-10-CM | POA: Diagnosis not present

## 2021-05-04 ENCOUNTER — Telehealth: Payer: Self-pay | Admitting: Cardiology

## 2021-05-04 NOTE — Progress Notes (Deleted)
Cardiology Office Note:    Date:  05/04/2021   ID:  Bobby Jensen, DOB [birth year], MRN [id number]  PCP:  [name], MD   [facility] Providers Cardiologist:  None {   Referring MD: [name], MD    History of Present Illness:    Bobby Jensen is a 88 y.o. male with a hx of HTN, DMII, HLD who presents to clinic for follow-up of chest pain.  Was last seen in clinic in 04/2021 where he was having intermittent chest pain with exertion. TTE 04/24/21 with EF 55-60%, G1DD, normal RV systolic function, mild MR. Cardiac monitor was obtained due to bigeminy on ECG in clinic which showed 2.6% burden of PVCs, 6% SVE, 5 episodes of brief SVT. We were concerned that he had symptomatic CAD, but patient wanted to pursue medical management. We trialed him on imdur but he did not tolerate it due to HA. We then changed him to ranexa with improvement.  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   Inguinal hernia without mention of obstruction or gangrene, unilateral or unspecified, (not specified as recurrent) 11/20/2008   Insomnia, unspecified 11/01/2003   Kidney calculi 08/20/2009   Other and unspecified hyperlipidemia 12/23/2004   Other malaise and fatigue 12/23/2004   Pain in joint, pelvic region and thigh 12/17/2009   Pain in joint, shoulder region 11/01/2003   Peptic ulcer, unspecified site, unspecified as acute or chronic, without mention of hemorrhage, perforation, or obstruction 06/25/2003    Personal history of malaria 03/16/1939   Right rotator cuff tear 07/10/2018   Chronic per imaging 4/20   Right shoulder injury 07/10/2018   Rosacea 08/20/2009   SDH (subdural hematoma)    Type II or unspecified type diabetes mellitus without mention of complication, not stated as uncontrolled 07/15/2006   Unspecified constipation 08/05/2010   Unspecified hypothyroidism 12/17/2009    Past Surgical History:  Procedure Laterality Date   APPENDECTOMY  1956   HEMORROIDECTOMY  06/2002   Dr. Lennie Hummer   HERNIA REPAIR Bilateral 11/2008   inguinal   PILONIDAL CYST EXCISION  1963   PROSTATE SURGERY     TOTAL HIP ARTHROPLASTY Right 07/10/2009   Dr. Telford Nab    Current Medications: No outpatient medications have been marked as taking for the 05/12/21 encounter (Appointment) with Freada Bergeron, MD.     Allergies:   Imdur [isosorbide nitrate]   Social History   Socioeconomic History   Marital status: Married    Spouse name: Not on file   Number of children: Not on file   Years of education: Not on file   Highest education level: Not on file  Occupational History   Not on file  Tobacco Use   Smoking status: Former    Types: Cigarettes    Quit date: 07/31/1978    Years since quitting: 42.7   Smokeless tobacco: Never  Substance and Sexual Activity   Alcohol use: Yes    Comment: Occ. glass of wine   Drug use: No    Comment: Quit in  1980   Sexual activity: Yes    Partners: Female  Other Topics Concern   Not on file  Social History Narrative   Lives at home with his wife    Social Determinants of Health   Financial Resource Strain: Not on file  Food Insecurity: Not on file  Transportation Needs: Not on file  Physical Activity: Not on file  Stress: Not on file  Social Connections: Not on file     Family History: The patient's family history includes Heart disease in his father.  ROS:   Review of Systems  Constitutional:  Negative for chills and diaphoresis.   HENT:  Negative for ear pain and nosebleeds.   Eyes:  Negative for photophobia and discharge.  Respiratory:  Negative for hemoptysis and sputum production.   Cardiovascular:  Positive for chest pain. Negative for palpitations, orthopnea, claudication, leg swelling and PND.  Gastrointestinal:  Positive for nausea. Negative for vomiting.  Genitourinary:  Negative for dysuria and hematuria.  Musculoskeletal:  Negative for neck pain.  Neurological:  Negative for dizziness, tremors and loss of consciousness.  Endo/Heme/Allergies:  Negative for polydipsia.  Psychiatric/Behavioral:  Negative for substance abuse and suicidal ideas.     EKGs/Labs/Other Studies Reviewed:    The following studies were reviewed today: TTE 2021/05/21: IMPRESSIONS     1. Left ventricular ejection fraction, by estimation, is 55 to 60%. The  left ventricle has normal function. The left ventricle has no regional  wall motion abnormalities. Left ventricular diastolic parameters are  consistent with Grade I diastolic  dysfunction (impaired relaxation).   2. Right ventricular systolic function is normal. The right ventricular  size is normal. Tricuspid regurgitation signal is inadequate for assessing  PA pressure.   3. The mitral valve is normal in structure. Mild mitral valve  regurgitation. No evidence of mitral stenosis.   4. The aortic valve is tricuspid. Aortic valve regurgitation is not  visualized. Aortic valve sclerosis/calcification is present, without any  evidence of aortic stenosis.   5. The inferior vena cava is dilated in size with >50% respiratory  variability, suggesting right atrial pressure of 8 mmHg.   Cardiac Monitor 04/29/21: Patch wear time was 2 days and 1 hour Predominant rhythm was NSR with average HR 55 (ranging from 38-158bpm) 5 episodes of nonsustained SVT with longest episode lasting 5 beats Frequent SVE (6%, 9880), occasional VE (2.6%, 4353) No sustained arrhythmias or significant  pauses     Patch Wear Time:  2 days and 1 hours (2023-02-05T11:34:52-0500 to 2023-02-07T13:27:21-0500)   Patient had a min HR of 38 bpm, max HR of 158 bpm, and avg HR of 55 bpm. Predominant underlying rhythm was Sinus Rhythm. First Degree AV Block was present. 5 Supraventricular Tachycardia runs occurred, the run with the fastest interval lasting 5 beats  with a max rate of 158 bpm, the longest lasting 5 beats with an avg rate of 102 bpm. Isolated SVEs were frequent (6.0%, 9880), SVE Couplets were rare (<1.0%, 107), and SVE Triplets were rare (<1.0%, 24). Isolated VEs were occasional (2.6%, 4353), VE  Couplets were rare (<1.0%, 34), and VE Triplets were rare (<1.0%, 1). Ventricular Bigeminy and Trigeminy were present.  Nuclear stress test: 08/31/2013 Impression Exercise Capacity:  Lexiscan with low level exercise. BP Response:  Normal blood pressure response. Clinical Symptoms:  Chest pain ECG Impression:  No significant ST segment change suggestive of ischemia. Comparison with Prior Nuclear Study: No images to compare   Overall Impression:  This is a low/medium risk  scan. There is scar affecting the entire inferolateral wall and the mid/apical anterolateral wall. There is peri-infarct ischemia at the base of the anterolateral wall.   LV Ejection Fraction: 63%.  LV Wall Motion:  Wall motion is good overall. There is decreased motion in the lateral wall.   Dola Argyle, MD  EKG:  EKG is personally reviewed.  04/15/2021: Sinus bradycardia. Frequent PVC's.   Recent Labs: 09/17/2020: Hemoglobin 13.9; Platelets 158; TSH 1.94 04/02/2021: ALT 6; BUN 24; Creat 1.12; Potassium 4.0; Sodium 138   Recent Lipid Panel    Component Value Date/Time   CHOL 158 09/17/2020 0809   CHOL 158 07/25/2015 0825   TRIG 64 09/17/2020 0809   HDL 47 09/17/2020 0809   HDL 54 07/25/2015 0825   CHOLHDL 3.4 09/17/2020 0809   VLDL 13 06/16/2016 0833   LDLCALC 96 09/17/2020 0809     Risk Assessment/Calculations:            Physical Exam:    VS:  There were no vitals taken for this visit.    Wt Readings from Last 3 Encounters:  04/15/21 146 lb (66.2 kg)  04/07/21 145 lb 9.6 oz (66 kg)  09/23/20 150 lb (68 kg)     GEN: Well nourished, well developed in no acute distress HEENT: Normal NECK: No JVD; No carotid bruits LYMPHATICS: No lymphadenopathy CARDIAC: Bradycardic, regular rhythm, no murmurs, rubs, gallops RESPIRATORY:  Clear to auscultation without rales, wheezing or rhonchi  ABDOMEN: Soft, non-tender, non-distended MUSCULOSKELETAL:  No edema; No deformity  SKIN: Warm and dry NEUROLOGIC:  Alert and oriented x 3 PSYCHIATRIC:  Normal affect   ASSESSMENT:    No diagnosis found.  PLAN:    In order of problems listed above:  #CAD with stable angina Patient with exertional chest tightness that resolves with rest that has been ongoing for the past several months. Prior myoview in 2015 with inferolateral scar with mild periinfarct ischemia. We discussed the options of medication management, myoview and cath and patient would like to start with medications at this time due to advanced age. TTE with EF 55-60%, no WMA, mild MR. Currently, *** -Declined ischemic evaluation at this time -Did not tolerate imdur -Cannot tolerate BB due to bradycardia -Continue ASA 81mg  daily -Change simvastatin to crestor 20mg  daily -Continue losartan 100mg  daily -Continue ranexa 500mg  BID  #Occasional PVCs: #Frequent SVE: Cardiac monitor 04/2021 with 6% SVE and 2% PVCs. Asymptomatic. -Declined ischemic evaluation -No BB due to bradycardia  #HTN: Controlled and at goal. -Continue losartan 100mg  daily -Continue amlodipine 5mg  daily  #HLD: -Continue crestor 20mg  daily         Medication Adjustments/Labs and Tests Ordered: Current medicines are reviewed at length with the patient today.  Concerns regarding medicines are outlined above.   No orders of the defined types were placed in this  encounter.  No orders of the defined types were placed in this encounter.  There are no Patient Instructions on file for this visit.    I,Mathew Stumpf,acting as a Education administrator for Freada Bergeron, MD.,have documented all relevant documentation on the behalf of Freada Bergeron, MD,as directed by  Freada Bergeron, MD while in the presence of Freada Bergeron, MD.  I, Freada Bergeron, MD, have reviewed all documentation for this visit. The documentation on 05/04/21 for the exam, diagnosis, procedures, and orders are all accurate and complete.   Signed, Freada Bergeron, MD  05/04/2021 7:34 PM    [place]

## 2021-05-04 NOTE — Telephone Encounter (Signed)
The patient has been notified of the result and verbalized understanding.  All questions (if any) were answered.  Pt states he is not having any chest pain, and didn't have much of this at all, before we last saw him at his last OV.  He will continue his current regimen and see Dr. Johney Frame as planned for 05/12/21 at 1000. Pt verbalized understanding and agrees with this plan.

## 2021-05-04 NOTE — Telephone Encounter (Signed)
Patient is returning RN's call.

## 2021-05-04 NOTE — Telephone Encounter (Signed)
-----   Message from Freada Bergeron, MD sent at 05/02/2021  8:07 AM EST ----- His heart monitor shows that he has frequent extra beats from the top chamber of the heart and occasional extra beats from the bottom chamber of the heart. Fortunately, his echo shows his pumping function is normal. If he is not bothered by the extra beats (palpitations etc), no further medication adjustments need to be made. How is his chest discomfort?

## 2021-05-08 DIAGNOSIS — H52203 Unspecified astigmatism, bilateral: Secondary | ICD-10-CM | POA: Diagnosis not present

## 2021-05-08 DIAGNOSIS — E119 Type 2 diabetes mellitus without complications: Secondary | ICD-10-CM | POA: Diagnosis not present

## 2021-05-08 DIAGNOSIS — H2513 Age-related nuclear cataract, bilateral: Secondary | ICD-10-CM | POA: Diagnosis not present

## 2021-05-12 ENCOUNTER — Ambulatory Visit: Payer: Medicare Other | Admitting: Cardiology

## 2021-05-12 ENCOUNTER — Other Ambulatory Visit: Payer: Self-pay

## 2021-05-12 ENCOUNTER — Encounter: Payer: Self-pay | Admitting: Cardiology

## 2021-05-12 VITALS — BP 140/68 | HR 65 | Ht 65.0 in | Wt 147.8 lb

## 2021-05-12 DIAGNOSIS — I10 Essential (primary) hypertension: Secondary | ICD-10-CM | POA: Diagnosis not present

## 2021-05-12 DIAGNOSIS — E1169 Type 2 diabetes mellitus with other specified complication: Secondary | ICD-10-CM

## 2021-05-12 DIAGNOSIS — I25118 Atherosclerotic heart disease of native coronary artery with other forms of angina pectoris: Secondary | ICD-10-CM

## 2021-05-12 DIAGNOSIS — N1831 Chronic kidney disease, stage 3a: Secondary | ICD-10-CM

## 2021-05-12 DIAGNOSIS — E1122 Type 2 diabetes mellitus with diabetic chronic kidney disease: Secondary | ICD-10-CM | POA: Diagnosis not present

## 2021-05-12 DIAGNOSIS — I493 Ventricular premature depolarization: Secondary | ICD-10-CM | POA: Diagnosis not present

## 2021-05-12 DIAGNOSIS — E785 Hyperlipidemia, unspecified: Secondary | ICD-10-CM | POA: Diagnosis not present

## 2021-05-12 NOTE — Patient Instructions (Addendum)
Medication Instructions:   STOP TAKING RANOLAZINE (RANEXA)  NOW  *If you need a refill on your cardiac medications before your next appointment, please call your pharmacy*   Follow-Up:  3 MONTHS IN THE OFFICE WITH DR. Johney Frame

## 2021-05-12 NOTE — Progress Notes (Signed)
Cardiology Office Note:    Date:  05/12/2021   ID:  Bobby Jensen, DOB November 07, 1927, MRN [id number]  PCP:  [name], MD   [facility] Providers Cardiologist:  None {   Referring MD: [name], MD    History of Present Illness:    Bobby Jensen is a 88 y.o. male with a hx of HTN, DMII, HLD who presents to clinic for follow-up of chest pain.  Was last seen in clinic in 04/2021 where he was having intermittent chest pain with exertion. TTE 04/24/21 with EF 55-60%, G1DD, normal RV systolic function, mild MR. Cardiac monitor was obtained due to bigeminy on ECG in clinic which showed 2.6% burden of PVCs, 6% SVE, 5 episodes of brief SVT. We were concerned that he had symptomatic CAD, but patient wanted to pursue medical management. We trialed him on imdur but he did not tolerate it due to HA. We then changed him to ranexa but he continues to have dizziness.   Today, the patient states that he does not like the ranexa as it continues to make him feel dizzy. He continues to have intermittent "tightness" across his chest when he is agitated or anxious. This is not overly bothersome to him. He is still able to complete all household chores and care for his wife without issues. We spent a long time discussing his symptoms and the options of medications, further testing or continued surveillance and he wishes to pursue a continued watchful waiting approach to minimize procedures and medications if possible.  He continues to have intermittent skipped beats but no associated chest pain, SOB, palpitations or lightheadedness. He just notices that he has "hesitancies" when he feels his pulse. We discussed his heart monitor and TTE results at length today.   He denies any shortness of breath, or peripheral edema. No headaches, syncope, orthopnea, or PND.    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   Inguinal hernia without mention of obstruction or gangrene, unilateral or unspecified, (not specified as recurrent) 11/20/2008   Insomnia, unspecified 11/01/2003   Kidney calculi 08/20/2009   Other and unspecified hyperlipidemia 12/23/2004   Other malaise and fatigue 12/23/2004   Pain in joint, pelvic region and thigh 12/17/2009   Pain in joint, shoulder region 11/01/2003   Peptic ulcer, unspecified site, unspecified as acute or chronic, without mention of hemorrhage, perforation, or obstruction 06/25/2003   Personal history of malaria 03/16/1939   Right rotator cuff tear 07/10/2018   Chronic per imaging 4/20   Right shoulder injury 07/10/2018   Rosacea 08/20/2009   SDH (subdural hematoma)    Type II or unspecified type diabetes mellitus without mention of complication, not stated as uncontrolled 07/15/2006   Unspecified constipation 08/05/2010   Unspecified hypothyroidism 12/17/2009    Past Surgical History:  Procedure Laterality Date   APPENDECTOMY  1956   HEMORROIDECTOMY  06/2002   Dr. Lennie Hummer   HERNIA REPAIR Bilateral 11/2008   inguinal   PILONIDAL CYST EXCISION  1963   PROSTATE SURGERY     TOTAL HIP  ARTHROPLASTY Right 07/10/2009   Dr. Telford Nab    Current Medications: Current Meds  Medication Sig   acetaminophen (TYLENOL) 325 MG tablet Take 2 tablets (650 mg total) by mouth every 6 (six) hours as needed for mild pain (or Fever >/= 101).   ALPRAZolam (XANAX) 0.25 MG tablet TAKE 1 TABLET(0.25 MG) BY MOUTH THREE TIMES DAILY   amLODipine (NORVASC) 5 MG tablet Take one tablet by mouth once  daily.   aspirin 81 MG tablet Take 1 tablet (81 mg total) by mouth daily.   Cholecalciferol (VITAMIN D PO) Take 1,000 Units by mouth daily.    levothyroxine (SYNTHROID) 75 MCG tablet Take 1 tablet (75 mcg total) by mouth daily before breakfast.   losartan (COZAAR) 100 MG tablet Take one tablet by mouth once daily.   nitroGLYCERIN (NITRODUR - DOSED IN MG/24 HR) 0.2 mg/hr patch Apply 1/4 of the patch to affected area daily for 1 month. Remove old patch.   nitroGLYCERIN (NITROSTAT) 0.4 MG SL tablet Place 1 tablet (0.4 mg total) under the tongue every 5 (five) minutes as needed for chest pain.   OneTouch Delica Lancets 85Y MISC Check blood sugar once daily as directed E11.22   ONETOUCH ULTRA test strip USE TO CHECK BLOOD SUGAR ONCE DAILY AND AS DIRECTED   rosuvastatin (CRESTOR) 20 MG tablet Take 1 tablet (20 mg total) by mouth daily.   temazepam (RESTORIL) 15 MG capsule TAKE 1 CAPSULE BY MOUTH AT BEDTIME AS NEEDED FOR SLEEP   triamcinolone (KENALOG) 0.1 % APPLY EXTERNALLY TO RASH TWICE DAILY AS NEEDED   [DISCONTINUED] ranolazine (RANEXA) 500 MG 12 hr tablet Take 1 tablet (500 mg total) by mouth 2 (two) times daily.     Allergies:   Imdur [isosorbide nitrate]   Social History   Socioeconomic History   Marital status: Married    Spouse name: Not on file   Number of children: Not on file   Years of education: Not on file   Highest education level: Not on file  Occupational History   Not on file  Tobacco Use   Smoking status: Former    Types: Cigarettes    Quit date: 07/31/1978    Years since quitting: 42.8   Smokeless tobacco: Never  Substance and Sexual Activity   Alcohol use: Yes    Comment: Occ. glass of wine   Drug use: No    Comment: Quit in 1980   Sexual activity: Yes    Partners: Female  Other Topics Concern   Not on file  Social History Narrative   Lives at home with his wife    Social Determinants of Health   Financial Resource Strain: Not on file  Food Insecurity:  Not on file  Transportation Needs: Not on file  Physical Activity: Not on file  Stress: Not on file  Social Connections: Not on file     Family History: The patient's family history includes Heart disease in his father.  ROS:   Review of Systems  Constitutional:  Negative for chills and diaphoresis.  HENT:  Negative for ear pain and nosebleeds.   Eyes:  Negative for photophobia and discharge.  Respiratory:  Negative for hemoptysis and sputum production.   Cardiovascular:  Positive for palpitations. Negative for chest pain, orthopnea, claudication, leg swelling and PND.  Gastrointestinal:  Negative for nausea and vomiting.  Genitourinary:  Negative for dysuria and hematuria.  Musculoskeletal:  Negative for neck pain.  Neurological:  Positive for dizziness. Negative for tremors and loss of  consciousness.  Endo/Heme/Allergies:  Negative for polydipsia.  Psychiatric/Behavioral:  Negative for substance abuse and suicidal ideas.     EKGs/Labs/Other Studies Reviewed:    The following studies were reviewed today:  TTE 04/24/21: IMPRESSIONS    1. Left ventricular ejection fraction, by estimation, is 55 to 60%. The  left ventricle has normal function. The left ventricle has no regional  wall motion abnormalities. Left ventricular diastolic parameters are  consistent with Grade I diastolic  dysfunction (impaired relaxation).   2. Right ventricular systolic function is normal. The right ventricular  size is normal. Tricuspid regurgitation signal is inadequate for assessing  PA pressure.   3. The mitral valve is normal in structure. Mild mitral valve  regurgitation. No evidence of mitral stenosis.   4. The aortic valve is tricuspid. Aortic valve regurgitation is not  visualized. Aortic valve sclerosis/calcification is present, without any  evidence of aortic stenosis.   5. The inferior vena cava is dilated in size with >50% respiratory  variability, suggesting right atrial pressure  of 8 mmHg.   Cardiac Monitor 04/29/21: Patch wear time was 2 days and 1 hour Predominant rhythm was NSR with average HR 55 (ranging from 38-158bpm) 5 episodes of nonsustained SVT with longest episode lasting 5 beats Frequent SVE (6%, 9880), occasional VE (2.6%, 4353) No sustained arrhythmias or significant pauses   Patch Wear Time:  2 days and 1 hours (2023-02-05T11:34:52-0500 to 2023-02-07T13:27:21-0500)   Patient had a min HR of 38 bpm, max HR of 158 bpm, and avg HR of 55 bpm. Predominant underlying rhythm was Sinus Rhythm. First Degree AV Block was present. 5 Supraventricular Tachycardia runs occurred, the run with the fastest interval lasting 5 beats  with a max rate of 158 bpm, the longest lasting 5 beats with an avg rate of 102 bpm. Isolated SVEs were frequent (6.0%, 9880), SVE Couplets were rare (<1.0%, 107), and SVE Triplets were rare (<1.0%, 24). Isolated VEs were occasional (2.6%, 4353), VE  Couplets were rare (<1.0%, 34), and VE Triplets were rare (<1.0%, 1). Ventricular Bigeminy and Trigeminy were present.  Nuclear stress test: 08/31/2013 Impression Exercise Capacity:  Lexiscan with low level exercise. BP Response:  Normal blood pressure response. Clinical Symptoms:  Chest pain ECG Impression:  No significant ST segment change suggestive of ischemia. Comparison with Prior Nuclear Study: No images to compare   Overall Impression:  This is a low/medium risk scan. There is scar affecting the entire inferolateral wall and the mid/apical anterolateral wall. There is peri-infarct ischemia at the base of the anterolateral wall.   LV Ejection Fraction: 63%.  LV Wall Motion:  Wall motion is good overall. There is decreased motion in the lateral wall.   Dola Argyle, MD  EKG:  EKG is personally reviewed.  05/12/2021: Sinus rhythm. Rate 65 bpm. PVCs. 04/15/2021: Sinus bradycardia. Frequent PVC's.   Recent Labs: 09/17/2020: Hemoglobin 13.9; Platelets 158; TSH 1.94 04/02/2021: ALT 6; BUN  24; Creat 1.12; Potassium 4.0; Sodium 138   Recent Lipid Panel    Component Value Date/Time   CHOL 158 09/17/2020 0809   CHOL 158 07/25/2015 0825   TRIG 64 09/17/2020 0809   HDL 47 09/17/2020 0809   HDL 54 07/25/2015 0825   CHOLHDL 3.4 09/17/2020 0809   VLDL 13 06/16/2016 0833   LDLCALC 96 09/17/2020 0809     Risk Assessment/Calculations:           Physical Exam:    VS:  BP 140/68    Pulse 65  Ht 5\' 5"  (1.651 m)    Wt 147 lb 12.8 oz (67 kg)    SpO2 99%    BMI 24.60 kg/m     Wt Readings from Last 3 Encounters:  05/12/21 147 lb 12.8 oz (67 kg)  04/15/21 146 lb (66.2 kg)  04/07/21 145 lb 9.6 oz (66 kg)     GEN: Well nourished, well developed in no acute distress HEENT: Normal NECK: No JVD; No carotid bruits CARDIAC: RR, irregular rhythm, no murmurs RESPIRATORY:  Clear to auscultation without rales, wheezing or rhonchi  ABDOMEN: Soft, non-tender, non-distended MUSCULOSKELETAL:  No edema; No deformity  SKIN: Warm and dry NEUROLOGIC:  Alert and oriented x 3 PSYCHIATRIC:  Normal affect   ASSESSMENT:    1. Coronary artery disease of native artery of native heart with stable angina pectoris ([place])   2. Essential hypertension   3. PVC's (premature ventricular contractions)   4. Hyperlipidemia associated with type 2 diabetes mellitus ([place])     PLAN:    In order of problems listed above:  #CAD with stable angina: Patient with chest tightness that occurs with agitation, anxiety and occasionally with exertion that resolves with rest that has been ongoing for the past several months. Prior myoview in 2015 with inferolateral scar with mild periinfarct ischemia. TTE with EF 55-60%, no WMA, mild MR.We discussed the options of medication management, myoview and cath and patient would like to start with medical management and watchful waiting at this time and only pursue further testing if symptoms progress or worsen. Did not tolerate imdur and is not tolerating ranexa due to  dizziness.  -Declined ischemic evaluation at this time -Did not tolerate imdur or ranexa due to dizziness -Cannot tolerate BB due to bradycardia -Continue ASA 81mg  daily -Continue crestor 20mg  daily -Continue losartan 100mg  daily  #Occasional PVCs: #Frequent SVE: Cardiac monitor 04/2021 with 6% SVE and 2% PVCs. Asymptomatic. -Declined ischemic evaluation -No BB due to bradycardia  #HTN: Controlled and at goal. -Continue losartan 100mg  daily -Continue amlodipine 5mg  daily  #HLD: -Continue crestor 20mg  daily  #DMII: -Managed by PCP        Follow-up:  3 months.  Medication Adjustments/Labs and Tests Ordered: Current medicines are reviewed at length with the patient today.  Concerns regarding medicines are outlined above.   Orders Placed This Encounter  Procedures   EKG 12-Lead   No orders of the defined types were placed in this encounter.  Patient Instructions  Medication Instructions:   STOP TAKING RANOLAZINE (RANEXA)  NOW  *If you need a refill on your cardiac medications before your next appointment, please call your pharmacy*   Follow-Up:  3 MONTHS IN THE OFFICE WITH DR. Delmar Landau Stumpf,acting as a scribe for Freada Bergeron, MD.,have documented all relevant documentation on the behalf of Freada Bergeron, MD,as directed by  Freada Bergeron, MD while in the presence of Freada Bergeron, MD.  I, Freada Bergeron, MD, have reviewed all documentation for this visit. The documentation on 05/12/21 for the exam, diagnosis, procedures, and orders are all accurate and complete.   Signed, Freada Bergeron, MD  05/12/2021 11:28 AM    [place]

## 2021-05-19 ENCOUNTER — Telehealth: Payer: Self-pay | Admitting: Cardiology

## 2021-05-19 DIAGNOSIS — R42 Dizziness and giddiness: Secondary | ICD-10-CM

## 2021-05-19 DIAGNOSIS — R001 Bradycardia, unspecified: Secondary | ICD-10-CM

## 2021-05-19 MED ORDER — NITROGLYCERIN 0.4 MG SL SUBL: 0.4000 mg | SUBLINGUAL_TABLET | SUBLINGUAL | 2 refills | Status: AC | PRN

## 2021-05-19 NOTE — Telephone Encounter (Signed)
Pt's medication was sent to pt's pharmacy as requested. Confirmation received.  °

## 2021-05-19 NOTE — Telephone Encounter (Signed)
?*  STAT* If patient is at the pharmacy, call can be transferred to refill team. ? ? ?1. Which medications need to be refilled? (please list name of each medication and dose if known) nitroGLYCERIN (NITROSTAT) 0.4 MG SL tablet ? ?2. Which pharmacy/location (including street and city if local pharmacy) is medication to be sent to? [place], [address] ? ?3. Do they need a 30 day or 90 day supply? 90 ? ?Patient is out of medication  ? ?

## 2021-06-16 ENCOUNTER — Other Ambulatory Visit: Payer: Self-pay | Admitting: *Deleted

## 2021-06-16 MED ORDER — ALPRAZOLAM 0.25 MG PO TABS: ORAL_TABLET | ORAL | 5 refills | Status: DC

## 2021-06-16 NOTE — Telephone Encounter (Signed)
Pharmacy requested refill.  ?Epic LR: 03/18/2021 ?Contract Date: 04/07/2021 ?Pended Rx and sent to Dr. Sabra Heck for approval.  ?

## 2021-07-01 ENCOUNTER — Other Ambulatory Visit: Payer: Self-pay | Admitting: *Deleted

## 2021-07-01 DIAGNOSIS — G4709 Other insomnia: Secondary | ICD-10-CM

## 2021-07-01 MED ORDER — TEMAZEPAM 15 MG PO CAPS: ORAL_CAPSULE | ORAL | 1 refills | Status: DC

## 2021-07-01 NOTE — Telephone Encounter (Signed)
Patient requested refill.  ?Pended Rx and sent to [facility] (Dr. Sabra Heck out of office) due to [place] Warning.  ?

## 2021-07-02 ENCOUNTER — Telehealth: Payer: Self-pay

## 2021-07-02 NOTE — Telephone Encounter (Signed)
Message left on clinical intake voicemail:  ? ?" I need my temazepam, I have not slept in 2-3 days, I called yesterday and was told it would be called in, however the pharmacy states it was not sent. Please call me to discuss." ? ?Call was returned to patient. Patient aware rx was sent in yesterday. Patient states he spoke with the pharmacist today and was told we have not sent in approval for temazepam. Patient asked that I call the pharmacy to get a definite. ? ? ?Call placed to [facility], after a 16 minute wait I was told rx received by Marlene Bast, however something is wrong with their system and it is prohibiting them from processing.  ? ? ?I called patient and informed him of my conversation with the pharmacy and he plans to further follow-up with them  ?

## 2021-08-03 ENCOUNTER — Other Ambulatory Visit: Payer: Self-pay | Admitting: *Deleted

## 2021-08-03 DIAGNOSIS — E038 Other specified hypothyroidism: Secondary | ICD-10-CM

## 2021-08-03 MED ORDER — LEVOTHYROXINE SODIUM 75 MCG PO TABS: 75.0000 ug | ORAL_TABLET | Freq: Every day | ORAL | 1 refills | Status: DC

## 2021-08-03 NOTE — Telephone Encounter (Signed)
[facility] requested refill.

## 2021-08-23 NOTE — Progress Notes (Deleted)
Cardiology Office Note:    Date:  08/23/2021   ID:  Bobby Jensen, DOB [birth year], MRN [id number]  PCP:  [name], MD   [facility] Providers Cardiologist:  None {   Referring MD: [name], MD    History of Present Illness:    Bobby Jensen is a 88 y.o. male with a hx of HTN, DMII, HLD who presents to clinic for follow-up.  Was seen in clinic in 04/2021 where he was having intermittent chest pain with exertion. TTE 04/24/21 with EF 55-60%, G1DD, normal RV systolic function, mild MR. Cardiac monitor was obtained due to bigeminy on ECG in clinic which showed 2.6% burden of PVCs, 6% SVE, 5 episodes of brief SVT. We were concerned that he had symptomatic CAD, but patient wanted to pursue medical management. We trialed him on imdur but he did not tolerate it due to HA. We then changed him to ranexa but he continued to have dizziness.   Was last seen in clinic on 04/2021 where he was having dizziness with ranexa. He was having intermittent chest pain with stress or heavy exertion but this was not worsening. We discussed option of further testing/meds but he wished to continue with watchful waiting.   Today, ***  He denies any shortness of breath, or peripheral edema. No headaches, syncope, orthopnea, or PND.    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   Inguinal hernia without mention of obstruction or gangrene, unilateral or unspecified, (not specified as recurrent) 11/20/2008   Insomnia,  unspecified 11/01/2003   Kidney calculi 08/20/2009   Other and unspecified hyperlipidemia 12/23/2004   Other malaise and fatigue 12/23/2004   Pain in joint, pelvic region and thigh 12/17/2009   Pain in joint, shoulder region 11/01/2003   Peptic ulcer, unspecified site, unspecified as acute or chronic, without mention of hemorrhage, perforation, or obstruction 06/25/2003   Personal history of malaria 03/16/1939   Right rotator cuff tear 07/10/2018   Chronic per imaging 4/20   Right shoulder injury 07/10/2018   Rosacea 08/20/2009   SDH (subdural hematoma)    Type II or unspecified type diabetes mellitus without mention of complication, not stated as uncontrolled 07/15/2006   Unspecified constipation 08/05/2010   Unspecified hypothyroidism 12/17/2009    Past Surgical History:  Procedure Laterality Date   APPENDECTOMY  1956   HEMORROIDECTOMY  06/2002   Dr. Lennie Hummer   HERNIA REPAIR Bilateral 11/2008   inguinal   PILONIDAL CYST EXCISION  1963   PROSTATE SURGERY     TOTAL HIP ARTHROPLASTY Right 07/10/2009   Dr. Telford Nab    Current Medications: No outpatient medications have been marked as taking for the 08/27/21 encounter (Appointment) with Freada Bergeron, MD.     Allergies:   Imdur [isosorbide nitrate]   Social History   Socioeconomic History   Marital status: Married    Spouse name: Not on file   Number of children: Not on file   Years of education: Not on file   Highest education level: Not on file  Occupational History  Not on file  Tobacco Use   Smoking status: Former    Types: Cigarettes    Quit date: 07/31/1978    Years since quitting: 43.0   Smokeless tobacco: Never  Substance and Sexual Activity   Alcohol use: Yes    Comment: Occ. glass of wine   Drug use: No    Comment: Quit in 1980   Sexual activity: Yes    Partners: Female  Other Topics Concern   Not on file  Social History Narrative   Lives at home with his wife    Social Determinants of  Health   Financial Resource Strain: Low Risk  (05/30/2017)   Overall Financial Resource Strain (CARDIA)    Difficulty of Paying Living Expenses: Not hard at all  Food Insecurity: No Food Insecurity (05/30/2017)   Hunger Vital Sign    Worried About Running Out of Food in the Last Year: Never true    Ran Out of Food in the Last Year: Never true  Transportation Needs: No Transportation Needs (05/30/2017)   PRAPARE - Hydrologist (Medical): No    Lack of Transportation (Non-Medical): No  Physical Activity: Inactive (05/30/2017)   Exercise Vital Sign    Days of Exercise per Week: 0 days    Minutes of Exercise per Session: 0 min  Stress: Stress Concern Present (05/30/2017)   [place]    Feeling of Stress : To some extent  Social Connections: Moderately Integrated (05/30/2017)   Social Connection and Isolation Panel [NHANES]    Frequency of Communication with Friends and Family: More than three times a week    Frequency of Social Gatherings with Friends and Family: More than three times a week    Attends Religious Services: More than 4 times per year    Active Member of [employer] or Organizations: No    Attends Archivist Meetings: Never    Marital Status: Married     Family History: The patient's family history includes Heart disease in his father.  ROS:   Review of Systems  Constitutional:  Negative for chills and diaphoresis.  HENT:  Negative for ear pain and nosebleeds.   Eyes:  Negative for photophobia and discharge.  Respiratory:  Negative for hemoptysis and sputum production.   Cardiovascular:  Positive for palpitations. Negative for chest pain, orthopnea, claudication, leg swelling and PND.  Gastrointestinal:  Negative for nausea and vomiting.  Genitourinary:  Negative for dysuria and hematuria.  Musculoskeletal:  Negative for neck pain.  Neurological:  Positive for dizziness.  Negative for tremors and loss of consciousness.  Endo/Heme/Allergies:  Negative for polydipsia.  Psychiatric/Behavioral:  Negative for substance abuse and suicidal ideas.      EKGs/Labs/Other Studies Reviewed:    The following studies were reviewed today:  TTE 04/24/21: IMPRESSIONS    1. Left ventricular ejection fraction, by estimation, is 55 to 60%. The  left ventricle has normal function. The left ventricle has no regional  wall motion abnormalities. Left ventricular diastolic parameters are  consistent with Grade I diastolic  dysfunction (impaired relaxation).   2. Right ventricular systolic function is normal. The right ventricular  size is normal. Tricuspid regurgitation signal is inadequate for assessing  PA pressure.   3. The mitral valve is normal in structure. Mild mitral valve  regurgitation. No evidence of mitral stenosis.   4. The aortic valve is tricuspid. Aortic valve regurgitation is not  visualized. Aortic valve sclerosis/calcification is  present, without any  evidence of aortic stenosis.   5. The inferior vena cava is dilated in size with >50% respiratory  variability, suggesting right atrial pressure of 8 mmHg.   Cardiac Monitor 04/29/21: Patch wear time was 2 days and 1 hour Predominant rhythm was NSR with average HR 55 (ranging from 38-158bpm) 5 episodes of nonsustained SVT with longest episode lasting 5 beats Frequent SVE (6%, 9880), occasional VE (2.6%, 4353) No sustained arrhythmias or significant pauses   Patch Wear Time:  2 days and 1 hours (2023-02-05T11:34:52-0500 to 2023-02-07T13:27:21-0500)   Patient had a min HR of 38 bpm, max HR of 158 bpm, and avg HR of 55 bpm. Predominant underlying rhythm was Sinus Rhythm. First Degree AV Block was present. 5 Supraventricular Tachycardia runs occurred, the run with the fastest interval lasting 5 beats  with a max rate of 158 bpm, the longest lasting 5 beats with an avg rate of 102 bpm. Isolated SVEs were  frequent (6.0%, 9880), SVE Couplets were rare (<1.0%, 107), and SVE Triplets were rare (<1.0%, 24). Isolated VEs were occasional (2.6%, 4353), VE  Couplets were rare (<1.0%, 34), and VE Triplets were rare (<1.0%, 1). Ventricular Bigeminy and Trigeminy were present.  Nuclear stress test: 08/31/2013 Impression Exercise Capacity:  Lexiscan with low level exercise. BP Response:  Normal blood pressure response. Clinical Symptoms:  Chest pain ECG Impression:  No significant ST segment change suggestive of ischemia. Comparison with Prior Nuclear Study: No images to compare   Overall Impression:  This is a low/medium risk scan. There is scar affecting the entire inferolateral wall and the mid/apical anterolateral wall. There is peri-infarct ischemia at the base of the anterolateral wall.   LV Ejection Fraction: 63%.  LV Wall Motion:  Wall motion is good overall. There is decreased motion in the lateral wall.   Dola Argyle, MD  EKG:  EKG is personally reviewed.  05/12/2021: Sinus rhythm. Rate 65 bpm. PVCs. 04/15/2021: Sinus bradycardia. Frequent PVC's.   Recent Labs: 09/17/2020: Hemoglobin 13.9; Platelets 158; TSH 1.94 04/02/2021: ALT 6; BUN 24; Creat 1.12; Potassium 4.0; Sodium 138   Recent Lipid Panel    Component Value Date/Time   CHOL 158 09/17/2020 0809   CHOL 158 07/25/2015 0825   TRIG 64 09/17/2020 0809   HDL 47 09/17/2020 0809   HDL 54 07/25/2015 0825   CHOLHDL 3.4 09/17/2020 0809   VLDL 13 06/16/2016 0833   LDLCALC 96 09/17/2020 0809     Risk Assessment/Calculations:           Physical Exam:    VS:  There were no vitals taken for this visit.    Wt Readings from Last 3 Encounters:  05/12/21 147 lb 12.8 oz (67 kg)  04/15/21 146 lb (66.2 kg)  04/07/21 145 lb 9.6 oz (66 kg)     GEN: Well nourished, well developed in no acute distress HEENT: Normal NECK: No JVD; No carotid bruits CARDIAC: RR, irregular rhythm, no murmurs RESPIRATORY:  Clear to auscultation without  rales, wheezing or rhonchi  ABDOMEN: Soft, non-tender, non-distended MUSCULOSKELETAL:  No edema; No deformity  SKIN: Warm and dry NEUROLOGIC:  Alert and oriented x 3 PSYCHIATRIC:  Normal affect   ASSESSMENT:    No diagnosis found.   PLAN:    In order of problems listed above:  #CAD with stable angina: Patient with chest tightness that occurs with agitation, anxiety and occasionally with exertion that resolves with rest that has been ongoing for the past several months. Prior [employer]  in 2015 with inferolateral scar with mild periinfarct ischemia. TTE with EF 55-60%, no WMA, mild MR.We discussed the options of medication management, myoview and cath and patient would like to start with medical management and watchful waiting at this time and only pursue further testing if symptoms progress or worsen. Did not tolerate imdur or ranexa due to dizziness.  -Declined ischemic evaluation  -Did not tolerate imdur or ranexa due to dizziness -Cannot tolerate BB due to bradycardia -Continue ASA '81mg'$  daily -Continue crestor '20mg'$  daily -Continue losartan '100mg'$  daily  #Occasional PVCs: #Frequent SVE: Cardiac monitor 04/2021 with 6% SVE and 2% PVCs. Asymptomatic. -Declined ischemic evaluation -No BB due to bradycardia  #HTN: Controlled and at goal. -Continue losartan '100mg'$  daily -Continue amlodipine '5mg'$  daily  #HLD: -Continue crestor '20mg'$  daily  #DMII: -Managed by PCP        Follow-up:  3 months.  Medication Adjustments/Labs and Tests Ordered: Current medicines are reviewed at length with the patient today.  Concerns regarding medicines are outlined above.   No orders of the defined types were placed in this encounter.  No orders of the defined types were placed in this encounter.  There are no Patient Instructions on file for this visit.    I,Mathew Stumpf,acting as a Education administrator for Freada Bergeron, MD.,have documented all relevant documentation on the behalf of Freada Bergeron, MD,as directed by  Freada Bergeron, MD while in the presence of Freada Bergeron, MD.  I, Freada Bergeron, MD, have reviewed all documentation for this visit. The documentation on 08/23/21 for the exam, diagnosis, procedures, and orders are all accurate and complete.   Signed, Freada Bergeron, MD  08/23/2021 8:36 PM    [place]

## 2021-08-27 ENCOUNTER — Ambulatory Visit: Payer: Medicare Other | Admitting: Cardiology

## 2021-10-02 NOTE — Progress Notes (Unsigned)
Cardiology Office Note:    Date:  10/02/2021   ID:  Bobby Jensen, DOB [birth year], MRN [id number]  PCP:  [name], MD   [facility] Providers Cardiologist:  None {   Referring MD: [name], MD    History of Present Illness:    Bobby Jensen is a 88 y.o. male with a hx of HTN, DMII, HLD who presents to clinic for follow-up of chest pain.  Was last seen in clinic in 04/2021 where he was having intermittent chest pain with exertion. TTE 04/24/21 with EF 55-60%, G1DD, normal RV systolic function, mild MR. Cardiac monitor was obtained due to bigeminy on ECG in clinic which showed 2.6% burden of PVCs, 6% SVE, 5 episodes of brief SVT. We were concerned that he had symptomatic CAD, but patient wanted to pursue medical management. We trialed him on imdur but he did not tolerate it due to HA. We then changed him to ranexa but he continues to have dizziness.   Was last seen in clinic on 04/2021. Was having mild anginal symptoms but was able to do all ADLs without issues. Declined further medication or work-up at that time.  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   Inguinal hernia without mention of obstruction or gangrene, unilateral or unspecified, (not specified as recurrent) 11/20/2008   Insomnia, unspecified 11/01/2003   Kidney calculi 08/20/2009   Other and unspecified hyperlipidemia 12/23/2004   Other malaise and fatigue 12/23/2004   Pain in joint, pelvic region and thigh  12/17/2009   Pain in joint, shoulder region 11/01/2003   Peptic ulcer, unspecified site, unspecified as acute or chronic, without mention of hemorrhage, perforation, or obstruction 06/25/2003   Personal history of malaria 03/16/1939   Right rotator cuff tear 07/10/2018   Chronic per imaging 4/20   Right shoulder injury 07/10/2018   Rosacea 08/20/2009   SDH (subdural hematoma)    Type II or unspecified type diabetes mellitus without mention of complication, not stated as uncontrolled 07/15/2006   Unspecified constipation 08/05/2010   Unspecified hypothyroidism 12/17/2009    Past Surgical History:  Procedure Laterality Date   APPENDECTOMY  1956   HEMORROIDECTOMY  06/2002   Dr. Lennie Hummer   HERNIA REPAIR Bilateral 11/2008   inguinal   PILONIDAL CYST EXCISION  1963   PROSTATE SURGERY     TOTAL HIP ARTHROPLASTY Right 07/10/2009   Dr. Telford Nab    Current Medications: No outpatient medications have been marked as taking for the 10/07/21 encounter (Appointment) with Freada Bergeron, MD.     Allergies:   Imdur [isosorbide nitrate]   Social History   Socioeconomic History   Marital status: Married    Spouse name: Not on file   Number of children: Not on file   Years of education: Not on file   Highest education level: Not on file  Occupational History   Not on file  Tobacco Use   Smoking status: Former    Types: Cigarettes    Quit date: 07/31/1978    Years since  quitting: 43.2   Smokeless tobacco: Never  Substance and Sexual Activity   Alcohol use: Yes    Comment: Occ. glass of wine   Drug use: No    Comment: Quit in 1980   Sexual activity: Yes    Partners: Female  Other Topics Concern   Not on file  Social History Narrative   Lives at home with his wife    Social Determinants of Health   Financial Resource Strain: Low Risk  (05/30/2017)   Overall Financial Resource Strain (CARDIA)    Difficulty of Paying Living Expenses: Not hard at all  Food Insecurity: No Food  Insecurity (05/30/2017)   Hunger Vital Sign    Worried About Running Out of Food in the Last Year: Never true    Ran Out of Food in the Last Year: Never true  Transportation Needs: No Transportation Needs (05/30/2017)   PRAPARE - Hydrologist (Medical): No    Lack of Transportation (Non-Medical): No  Physical Activity: Inactive (05/30/2017)   Exercise Vital Sign    Days of Exercise per Week: 0 days    Minutes of Exercise per Session: 0 min  Stress: Stress Concern Present (05/30/2017)   [place]    Feeling of Stress : To some extent  Social Connections: Moderately Integrated (05/30/2017)   Social Connection and Isolation Panel [NHANES]    Frequency of Communication with Friends and Family: More than three times a week    Frequency of Social Gatherings with Friends and Family: More than three times a week    Attends Religious Services: More than 4 times per year    Active Member of [employer] or Organizations: No    Attends Archivist Meetings: Never    Marital Status: Married     Family History: The patient's family history includes Heart disease in his father.  ROS:   Review of Systems  Constitutional:  Negative for chills and diaphoresis.  HENT:  Negative for ear pain and nosebleeds.   Eyes:  Negative for photophobia and discharge.  Respiratory:  Negative for hemoptysis and sputum production.   Cardiovascular:  Positive for palpitations. Negative for chest pain, orthopnea, claudication, leg swelling and PND.  Gastrointestinal:  Negative for nausea and vomiting.  Genitourinary:  Negative for dysuria and hematuria.  Musculoskeletal:  Negative for neck pain.  Neurological:  Positive for dizziness. Negative for tremors and loss of consciousness.  Endo/Heme/Allergies:  Negative for polydipsia.  Psychiatric/Behavioral:  Negative for substance abuse and suicidal ideas.       EKGs/Labs/Other Studies Reviewed:    The following studies were reviewed today:  TTE 04/24/21: IMPRESSIONS    1. Left ventricular ejection fraction, by estimation, is 55 to 60%. The  left ventricle has normal function. The left ventricle has no regional  wall motion abnormalities. Left ventricular diastolic parameters are  consistent with Grade I diastolic  dysfunction (impaired relaxation).   2. Right ventricular systolic function is normal. The right ventricular  size is normal. Tricuspid regurgitation signal is inadequate for assessing  PA pressure.   3. The mitral valve is normal in structure. Mild mitral valve  regurgitation. No evidence of mitral stenosis.   4. The aortic valve is tricuspid. Aortic valve regurgitation is not  visualized. Aortic valve sclerosis/calcification is present, without any  evidence of aortic stenosis.   5. The inferior vena cava is dilated in size with >50% respiratory  variability, suggesting right atrial  pressure of 8 mmHg.   Cardiac Monitor 04/29/21: Patch wear time was 2 days and 1 hour Predominant rhythm was NSR with average HR 55 (ranging from 38-158bpm) 5 episodes of nonsustained SVT with longest episode lasting 5 beats Frequent SVE (6%, 9880), occasional VE (2.6%, 4353) No sustained arrhythmias or significant pauses   Patch Wear Time:  2 days and 1 hours (2023-02-05T11:34:52-0500 to 2023-02-07T13:27:21-0500)   Patient had a min HR of 38 bpm, max HR of 158 bpm, and avg HR of 55 bpm. Predominant underlying rhythm was Sinus Rhythm. First Degree AV Block was present. 5 Supraventricular Tachycardia runs occurred, the run with the fastest interval lasting 5 beats  with a max rate of 158 bpm, the longest lasting 5 beats with an avg rate of 102 bpm. Isolated SVEs were frequent (6.0%, 9880), SVE Couplets were rare (<1.0%, 107), and SVE Triplets were rare (<1.0%, 24). Isolated VEs were occasional (2.6%, 4353), VE  Couplets were rare (<1.0%, 34),  and VE Triplets were rare (<1.0%, 1). Ventricular Bigeminy and Trigeminy were present.  Nuclear stress test: 08/31/2013 Impression Exercise Capacity:  Lexiscan with low level exercise. BP Response:  Normal blood pressure response. Clinical Symptoms:  Chest pain ECG Impression:  No significant ST segment change suggestive of ischemia. Comparison with Prior Nuclear Study: No images to compare   Overall Impression:  This is a low/medium risk scan. There is scar affecting the entire inferolateral wall and the mid/apical anterolateral wall. There is peri-infarct ischemia at the base of the anterolateral wall.   LV Ejection Fraction: 63%.  LV Wall Motion:  Wall motion is good overall. There is decreased motion in the lateral wall.   Dola Argyle, MD  EKG:  EKG is personally reviewed.  05/12/2021: Sinus rhythm. Rate 65 bpm. PVCs. 04/15/2021: Sinus bradycardia. Frequent PVC's.   Recent Labs: 04/02/2021: ALT 6; BUN 24; Creat 1.12; Potassium 4.0; Sodium 138   Recent Lipid Panel    Component Value Date/Time   CHOL 158 09/17/2020 0809   CHOL 158 07/25/2015 0825   TRIG 64 09/17/2020 0809   HDL 47 09/17/2020 0809   HDL 54 07/25/2015 0825   CHOLHDL 3.4 09/17/2020 0809   VLDL 13 06/16/2016 0833   LDLCALC 96 09/17/2020 0809     Risk Assessment/Calculations:           Physical Exam:    VS:  There were no vitals taken for this visit.    Wt Readings from Last 3 Encounters:  05/12/21 147 lb 12.8 oz (67 kg)  04/15/21 146 lb (66.2 kg)  04/07/21 145 lb 9.6 oz (66 kg)     GEN: Well nourished, well developed in no acute distress HEENT: Normal NECK: No JVD; No carotid bruits CARDIAC: RR, irregular rhythm, no murmurs RESPIRATORY:  Clear to auscultation without rales, wheezing or rhonchi  ABDOMEN: Soft, non-tender, non-distended MUSCULOSKELETAL:  No edema; No deformity  SKIN: Warm and dry NEUROLOGIC:  Alert and oriented x 3 PSYCHIATRIC:  Normal affect   ASSESSMENT:    No diagnosis  found.   PLAN:    In order of problems listed above:  #CAD with stable angina: Patient with chest tightness that occurs with agitation, anxiety and occasionally with exertion that resolves with rest that has been ongoing for the past several months. Prior myoview in 2015 with inferolateral scar with mild periinfarct ischemia. TTE with EF 55-60%, no WMA, mild MR.We discussed the options of medication management, myoview and cath and patient would like to start with medical  management and watchful waiting at this time and only pursue further testing if symptoms progress or worsen. Did not tolerate imdur and is not tolerating ranexa due to dizziness.  -Declined ischemic evaluation at this time -Did not tolerate imdur or ranexa due to dizziness -Cannot tolerate BB due to bradycardia -Continue ASA '81mg'$  daily -Continue crestor '20mg'$  daily -Continue losartan '100mg'$  daily  #Occasional PVCs: #Frequent SVE: Cardiac monitor 04/2021 with 6% SVE and 2% PVCs. Asymptomatic. -Declined ischemic evaluation -No BB due to bradycardia  #HTN: Controlled and at goal. -Continue losartan '100mg'$  daily -Continue amlodipine '5mg'$  daily  #HLD: -Continue crestor '20mg'$  daily  #DMII: -Managed by PCP        Follow-up:  3 months.  Medication Adjustments/Labs and Tests Ordered: Current medicines are reviewed at length with the patient today.  Concerns regarding medicines are outlined above.   No orders of the defined types were placed in this encounter.  No orders of the defined types were placed in this encounter.  There are no Patient Instructions on file for this visit.    I,Mathew Stumpf,acting as a Education administrator for Freada Bergeron, MD.,have documented all relevant documentation on the behalf of Freada Bergeron, MD,as directed by  Freada Bergeron, MD while in the presence of Freada Bergeron, MD.  I, Freada Bergeron, MD, have reviewed all documentation for this visit. The documentation on  10/02/21 for the exam, diagnosis, procedures, and orders are all accurate and complete.   Signed, Freada Bergeron, MD  10/02/2021 10:38 AM    [place]

## 2021-10-07 ENCOUNTER — Other Ambulatory Visit: Payer: Medicare Other

## 2021-10-07 ENCOUNTER — Ambulatory Visit: Payer: Medicare Other | Admitting: Cardiology

## 2021-10-07 ENCOUNTER — Encounter: Payer: Self-pay | Admitting: Cardiology

## 2021-10-07 VITALS — BP 150/74 | HR 54 | Ht 64.0 in | Wt 141.8 lb

## 2021-10-07 DIAGNOSIS — E039 Hypothyroidism, unspecified: Secondary | ICD-10-CM | POA: Diagnosis not present

## 2021-10-07 DIAGNOSIS — E785 Hyperlipidemia, unspecified: Secondary | ICD-10-CM

## 2021-10-07 DIAGNOSIS — N1831 Chronic kidney disease, stage 3a: Secondary | ICD-10-CM | POA: Diagnosis not present

## 2021-10-07 DIAGNOSIS — E1169 Type 2 diabetes mellitus with other specified complication: Secondary | ICD-10-CM

## 2021-10-07 DIAGNOSIS — R001 Bradycardia, unspecified: Secondary | ICD-10-CM | POA: Diagnosis not present

## 2021-10-07 DIAGNOSIS — E1122 Type 2 diabetes mellitus with diabetic chronic kidney disease: Secondary | ICD-10-CM | POA: Diagnosis not present

## 2021-10-07 DIAGNOSIS — R002 Palpitations: Secondary | ICD-10-CM | POA: Diagnosis not present

## 2021-10-07 DIAGNOSIS — I10 Essential (primary) hypertension: Secondary | ICD-10-CM

## 2021-10-07 DIAGNOSIS — I493 Ventricular premature depolarization: Secondary | ICD-10-CM | POA: Diagnosis not present

## 2021-10-07 DIAGNOSIS — I25118 Atherosclerotic heart disease of native coronary artery with other forms of angina pectoris: Secondary | ICD-10-CM | POA: Diagnosis not present

## 2021-10-07 NOTE — Patient Instructions (Signed)

## 2021-10-07 NOTE — Progress Notes (Signed)
Cardiology Office Note:    Date:  10/07/2021   ID:  Bobby Jensen, DOB 15-Aug-1927, MRN [id number]  PCP:  [name], MD   [facility] Providers Cardiologist:  None {   Referring MD: [name], MD    History of Present Illness:    Bobby Jensen is a 88 y.o. male with a hx of HTN, DMII, HLD who presents to clinic for follow-up of chest pain.  Was last seen in clinic in 04/2021 where he was having intermittent chest pain with exertion. TTE 04/24/21 with EF 55-60%, G1DD, normal RV systolic function, mild MR. Cardiac monitor was obtained due to bigeminy on ECG in clinic which showed 2.6% burden of PVCs, 6% SVE, 5 episodes of brief SVT. We were concerned that he had symptomatic CAD, but patient wanted to pursue medical management. We trialed him on imdur but he did not tolerate it due to HA. We then changed him to ranexa but he continued to have dizziness.   Was last seen in clinic on 04/2021. Was having mild anginal symptoms but was able to do all ADLs without issues. Declined further medication or work-up at that time.  Today, the patient states that he continues to care for his wife. He still struggles with his episodes of dizziness, not improved with stopping his ranexa or imdur. Symptoms mainly occur when he is irritated or tense and he changes positions. He denies any syncopal episodes. No exertional symptoms. Has not checked his blood pressure when he feels dizzy.  Due to a few episodes of anxiety and chest discomfort he did take some nitroglycerin, which helped relieve his symptoms. He recently refilled his prescription.  He frequently checks his pulse, usually while sitting and watching TV. This enables him to detect his occasional extra/skipped beats.  He denies any chest pain, shortness of breath, or peripheral edema. No headaches, syncope, orthopnea, or PND.  He again, would like to manage things conservatively unless symptoms progress or interfere with  quality of life.   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   Inguinal hernia without mention of obstruction or gangrene, unilateral or unspecified, (not specified as recurrent) 11/20/2008   Insomnia, unspecified 11/01/2003   Kidney calculi 08/20/2009   Other and unspecified hyperlipidemia 12/23/2004   Other malaise and fatigue 12/23/2004   Pain in joint, pelvic region and thigh 12/17/2009   Pain in joint, shoulder region 11/01/2003   Peptic ulcer, unspecified site, unspecified as acute or chronic, without mention of hemorrhage, perforation, or obstruction 06/25/2003   Personal history of malaria 03/16/1939   Right rotator cuff tear 07/10/2018   Chronic per imaging 4/20   Right shoulder injury 07/10/2018   Rosacea 08/20/2009   SDH (subdural hematoma) (HCC)    Type II or unspecified type diabetes mellitus without mention of complication, not stated as uncontrolled 07/15/2006   Unspecified constipation 08/05/2010   Unspecified hypothyroidism 12/17/2009    Past Surgical History:  Procedure Laterality Date   APPENDECTOMY  1956   HEMORROIDECTOMY  06/2002   Dr. Lennie Hummer   HERNIA  REPAIR Bilateral 11/2008   inguinal   PILONIDAL CYST EXCISION  1963   PROSTATE SURGERY     TOTAL HIP ARTHROPLASTY Right 07/10/2009   Dr. Telford Nab    Current Medications: Current Meds  Medication Sig   acetaminophen (TYLENOL) 325 MG tablet Take 2 tablets (650 mg total) by mouth every 6 (six) hours as needed for mild pain (or Fever >/= 101).    ALPRAZolam (XANAX) 0.25 MG tablet TAKE 1 TABLET(0.25 MG) BY MOUTH THREE TIMES DAILY   amLODipine (NORVASC) 5 MG tablet Take one tablet by mouth once daily.   aspirin 81 MG tablet Take 1 tablet (81 mg total) by mouth daily.   Cholecalciferol (VITAMIN D PO) Take 1,000 Units by mouth daily.    levothyroxine (SYNTHROID) 75 MCG tablet Take 1 tablet (75 mcg total) by mouth daily before breakfast.   losartan (COZAAR) 100 MG tablet Take one tablet by mouth once daily.   nitroGLYCERIN (NITROSTAT) 0.4 MG SL tablet Place 1 tablet (0.4 mg total) under the tongue every 5 (five) minutes as needed for chest pain.   OneTouch Delica Lancets 86P MISC Check blood sugar once daily as directed E11.22   ONETOUCH ULTRA test strip USE TO CHECK BLOOD SUGAR ONCE DAILY AND AS DIRECTED   rosuvastatin (CRESTOR) 20 MG tablet Take 1 tablet (20 mg total) by mouth daily.   temazepam (RESTORIL) 15 MG capsule Take one capsule by mouth at bedtime as needed for sleep.   triamcinolone (KENALOG) 0.1 % APPLY EXTERNALLY TO RASH TWICE DAILY AS NEEDED     Allergies:   Imdur [isosorbide nitrate]   Social History   Socioeconomic History   Marital status: Married    Spouse name: Not on file   Number of children: Not on file   Years of education: Not on file   Highest education level: Not on file  Occupational History   Not on file  Tobacco Use   Smoking status: Former    Types: Cigarettes    Quit date: 07/31/1978    Years since quitting: 43.2   Smokeless tobacco: Never  Substance and Sexual Activity   Alcohol use: Yes    Comment: Occ. glass of wine   Drug use: No    Comment: Quit in 1980   Sexual activity: Yes    Partners: Female  Other Topics Concern   Not on file  Social History Narrative   Lives at home with his wife    Social Determinants of Health   Financial Resource Strain: Low Risk  (05/30/2017)   Overall Financial Resource Strain (CARDIA)    Difficulty of Paying Living Expenses: Not hard at all  Food  Insecurity: No Food Insecurity (05/30/2017)   Hunger Vital Sign    Worried About Running Out of Food in the Last Year: Never true    Ran Out of Food in the Last Year: Never true  Transportation Needs: No Transportation Needs (05/30/2017)   PRAPARE - Hydrologist (Medical): No    Lack of Transportation (Non-Medical): No  Physical Activity: Inactive (05/30/2017)   Exercise Vital Sign    Days of Exercise per Week: 0 days    Minutes of Exercise per Session: 0 min  Stress: Stress Concern Present (05/30/2017)   [place]    Feeling of Stress : To some extent  Social Connections: Moderately Integrated (05/30/2017)   Social Connection and Isolation Panel [NHANES]    Frequency of Communication  with Friends and Family: More than three times a week    Frequency of Social Gatherings with Friends and Family: More than three times a week    Attends Religious Services: More than 4 times per year    Active Member of [employer] or Organizations: No    Attends Archivist Meetings: Never    Marital Status: Married     Family History: The patient's family history includes Heart disease in his father.  ROS:   Review of Systems  Constitutional:  Negative for chills and diaphoresis.  HENT:  Negative for ear pain and nosebleeds.   Eyes:  Negative for photophobia and discharge.  Respiratory:  Negative for hemoptysis and sputum production.   Cardiovascular:  Positive for palpitations. Negative for chest pain, orthopnea, claudication, leg swelling and PND.  Gastrointestinal:  Negative for nausea and vomiting.  Genitourinary:  Negative for dysuria and hematuria.  Musculoskeletal:  Negative for neck pain.  Neurological:  Positive for dizziness. Negative for tremors and loss of consciousness.  Endo/Heme/Allergies:  Negative for polydipsia.  Psychiatric/Behavioral:  Negative for substance abuse and suicidal ideas.       EKGs/Labs/Other Studies Reviewed:    The following studies were reviewed today:  TTE 04/24/21: IMPRESSIONS    1. Left ventricular ejection fraction, by estimation, is 55 to 60%. The  left ventricle has normal function. The left ventricle has no regional  wall motion abnormalities. Left ventricular diastolic parameters are  consistent with Grade I diastolic  dysfunction (impaired relaxation).   2. Right ventricular systolic function is normal. The right ventricular  size is normal. Tricuspid regurgitation signal is inadequate for assessing  PA pressure.   3. The mitral valve is normal in structure. Mild mitral valve  regurgitation. No evidence of mitral stenosis.   4. The aortic valve is tricuspid. Aortic valve regurgitation is not  visualized. Aortic valve sclerosis/calcification is present, without any  evidence of aortic stenosis.   5. The inferior vena cava is dilated in size with >50% respiratory  variability, suggesting right atrial pressure of 8 mmHg.   Cardiac Monitor 04/29/21: Patch wear time was 2 days and 1 hour Predominant rhythm was NSR with average HR 55 (ranging from 38-158bpm) 5 episodes of nonsustained SVT with longest episode lasting 5 beats Frequent SVE (6%, 9880), occasional VE (2.6%, 4353) No sustained arrhythmias or significant pauses   Patch Wear Time:  2 days and 1 hours (2023-02-05T11:34:52-0500 to 2023-02-07T13:27:21-0500)   Patient had a min HR of 38 bpm, max HR of 158 bpm, and avg HR of 55 bpm. Predominant underlying rhythm was Sinus Rhythm. First Degree AV Block was present. 5 Supraventricular Tachycardia runs occurred, the run with the fastest interval lasting 5 beats  with a max rate of 158 bpm, the longest lasting 5 beats with an avg rate of 102 bpm. Isolated SVEs were frequent (6.0%, 9880), SVE Couplets were rare (<1.0%, 107), and SVE Triplets were rare (<1.0%, 24). Isolated VEs were occasional (2.6%, 4353), VE  Couplets were rare (<1.0%,  34), and VE Triplets were rare (<1.0%, 1). Ventricular Bigeminy and Trigeminy were present.  Nuclear stress test: 08/31/2013 Impression Exercise Capacity:  Lexiscan with low level exercise. BP Response:  Normal blood pressure response. Clinical Symptoms:  Chest pain ECG Impression:  No significant ST segment change suggestive of ischemia. Comparison with Prior Nuclear Study: No images to compare   Overall Impression:  This is a low/medium risk scan. There is scar affecting the entire inferolateral wall and the mid/apical  anterolateral wall. There is peri-infarct ischemia at the base of the anterolateral wall.   LV Ejection Fraction: 63%.  LV Wall Motion:  Wall motion is good overall. There is decreased motion in the lateral wall.   Dola Argyle, MD   EKG:  EKG is personally reviewed.  10/07/2021:  EKG was not ordered. 05/12/2021: Sinus rhythm. Rate 65 bpm. PVCs. 04/15/2021: Sinus bradycardia. Frequent PVC's.   Recent Labs: 04/02/2021: ALT 6; BUN 24; Creat 1.12; Potassium 4.0; Sodium 138   Recent Lipid Panel    Component Value Date/Time   CHOL 158 09/17/2020 0809   CHOL 158 07/25/2015 0825   TRIG 64 09/17/2020 0809   HDL 47 09/17/2020 0809   HDL 54 07/25/2015 0825   CHOLHDL 3.4 09/17/2020 0809   VLDL 13 06/16/2016 0833   LDLCALC 96 09/17/2020 0809     Risk Assessment/Calculations:           Physical Exam:    VS:  BP (!) 150/74   Pulse (!) 54   Ht '5\' 4"'$  (1.626 m)   Wt 141 lb 12.8 oz (64.3 kg)   SpO2 98%   BMI 24.34 kg/m     Wt Readings from Last 3 Encounters:  10/07/21 141 lb 12.8 oz (64.3 kg)  05/12/21 147 lb 12.8 oz (67 kg)  04/15/21 146 lb (66.2 kg)     GEN: Well nourished, well developed in no acute distress HEENT: Normal NECK: No JVD; No carotid bruits CARDIAC: RR, occasional skipped beats, no murmurs RESPIRATORY:  Clear to auscultation without rales, wheezing or rhonchi  ABDOMEN: Soft, non-tender, non-distended MUSCULOSKELETAL:  No edema; No deformity   SKIN: Warm and dry NEUROLOGIC:  Alert and oriented x 3 PSYCHIATRIC:  Normal affect   ASSESSMENT:    1. Coronary artery disease of native artery of native heart with stable angina pectoris ([place])   2. Bradycardia   3. Essential hypertension   4. Hyperlipidemia associated with type 2 diabetes mellitus (HCC)   5. Palpitations   6. PVC's (premature ventricular contractions)   7. Type 2 diabetes mellitus with stage 3a chronic kidney disease, without long-term current use of insulin (HCC)      PLAN:    In order of problems listed above:  #CAD with stable angina: Patient with chest tightness that occurs with agitation, anxiety and occasionally with exertion that resolves with rest that has been ongoing for the past several months. Prior myoview in 2015 with inferolateral scar with mild periinfarct ischemia. TTE with EF 55-60%, no WMA, mild MR.We discussed the options of medication management, myoview and cath and patient would like to start with medical management and watchful waiting at this time and only pursue further testing if symptoms progress or worsen. Did not tolerate imdur or ranexa due to dizziness.  -Declined ischemic evaluation at this time -Did not tolerate imdur or ranexa due to dizziness -Cannot tolerate BB due to bradycardia -Continue ASA '81mg'$  daily -Continue crestor '20mg'$  daily -Continue losartan '100mg'$  daily  #Occasional PVCs: #Frequent SVE: Cardiac monitor 04/2021 with 6% SVE and 2% PVCs. Asymptomatic. -Declined ischemic evaluation -No BB due to bradycardia  #HTN: Elevated in the clinic but more controlled at home. Having episodes of dizziness with concern for orthostasis. Asked him to monitor BP at home and let us know if having lows.  -Continue losartan '100mg'$  daily -Continue amlodipine '5mg'$  daily  #HLD: -Continue crestor '20mg'$  daily  #DMII: -Managed by PCP        Follow-up:  6 months.  Medication  Adjustments/Labs and Tests Ordered: Current medicines are  reviewed at length with the patient today.  Concerns regarding medicines are outlined above.   No orders of the defined types were placed in this encounter.  No orders of the defined types were placed in this encounter.  Patient Instructions  Medication Instructions:   Your physician recommends that you continue on your current medications as directed. Please refer to the Current Medication list given to you today.  *If you need a refill on your cardiac medications before your next appointment, please call your pharmacy*   Follow-Up: At [facility], you and your health needs are our priority.  As part of our continuing mission to provide you with exceptional heart care, we have created designated Provider Care Teams.  These Care Teams include your primary Cardiologist (physician) and Advanced Practice Providers (APPs -  Physician Assistants and Nurse Practitioners) who all work together to provide you with the care you need, when you need it.  We recommend signing up for the patient portal called "MyChart".  Sign up information is provided on this After Visit Summary.  MyChart is used to connect with patients for Virtual Visits (Telemedicine).  Patients are able to view lab/test results, encounter notes, upcoming appointments, etc.  Non-urgent messages can be sent to your provider as well.   To learn more about what you can do with MyChart, go to [email or web address].    Your next appointment:   6 month(s)  The format for your next appointment:   In Person  Provider:   Dr. Johney Frame   Important Information About Sugar       I,Mathew Stumpf,acting as a scribe for Freada Bergeron, MD.,have documented all relevant documentation on the behalf of Freada Bergeron, MD,as directed by  Freada Bergeron, MD while in the presence of Freada Bergeron, MD.  I, Freada Bergeron, MD, have reviewed all documentation for this visit. The documentation on 10/07/21 for the  exam, diagnosis, procedures, and orders are all accurate and complete.   Signed, Freada Bergeron, MD  10/07/2021 3:14 PM    [place]

## 2021-10-08 LAB — HEMOGLOBIN A1C
Hgb A1c MFr Bld: 5.8 % of total Hgb — ABNORMAL HIGH (ref <5.7)
Mean Plasma Glucose: 120 mg/dL
eAG (mmol/L): 6.6 mmol/L

## 2021-10-08 LAB — LIPID PANEL
Cholesterol: 127 mg/dL (ref <200)
HDL: 54 mg/dL (ref >=40)
LDL Cholesterol (Calc): 60 mg/dL (calc)
Non-HDL Cholesterol (Calc): 73 mg/dL (calc) (ref <130)
Total CHOL/HDL Ratio: 2.4 (calc) (ref <5.0)
Triglycerides: 57 mg/dL (ref <150)

## 2021-10-08 LAB — TSH: TSH: 1.84 mIU/L (ref 0.40–4.50)

## 2021-10-08 LAB — COMPLETE METABOLIC PANEL WITH GFR
AG Ratio: 1.7 (calc) (ref 1.0–2.5)
ALT: 11 U/L (ref 9–46)
AST: 14 U/L (ref 10–35)
Albumin: 4.1 g/dL (ref 3.6–5.1)
Alkaline phosphatase (APISO): 56 U/L (ref 35–144)
BUN: 23 mg/dL (ref 7–25)
CO2: 26 mmol/L (ref 20–32)
Calcium: 9.1 mg/dL (ref 8.6–10.3)
Chloride: 108 mmol/L (ref 98–110)
Creat: 1.01 mg/dL (ref 0.70–1.22)
Globulin: 2.4 g/dL (calc) (ref 1.9–3.7)
Glucose, Bld: 93 mg/dL (ref 65–99)
Potassium: 4.4 mmol/L (ref 3.5–5.3)
Sodium: 141 mmol/L (ref 135–146)
Total Bilirubin: 0.6 mg/dL (ref 0.2–1.2)
Total Protein: 6.5 g/dL (ref 6.1–8.1)
eGFR: 70 mL/min/{1.73_m2} (ref >=60)

## 2021-10-12 ENCOUNTER — Other Ambulatory Visit: Payer: Self-pay

## 2021-10-12 DIAGNOSIS — I10 Essential (primary) hypertension: Secondary | ICD-10-CM

## 2021-10-12 DIAGNOSIS — R002 Palpitations: Secondary | ICD-10-CM

## 2021-10-12 DIAGNOSIS — E1169 Type 2 diabetes mellitus with other specified complication: Secondary | ICD-10-CM

## 2021-10-12 DIAGNOSIS — R079 Chest pain, unspecified: Secondary | ICD-10-CM

## 2021-10-12 MED ORDER — ROSUVASTATIN CALCIUM 20 MG PO TABS: 20.0000 mg | ORAL_TABLET | Freq: Every day | ORAL | 3 refills | Status: DC

## 2021-10-13 ENCOUNTER — Encounter: Payer: Self-pay | Admitting: Family Medicine

## 2021-10-13 ENCOUNTER — Ambulatory Visit (INDEPENDENT_AMBULATORY_CARE_PROVIDER_SITE_OTHER): Payer: Medicare Other | Admitting: Family Medicine

## 2021-10-13 VITALS — BP 130/72 | HR 52 | Temp 97.1°F | Resp 18 | Ht 64.0 in | Wt 142.0 lb

## 2021-10-13 DIAGNOSIS — E785 Hyperlipidemia, unspecified: Secondary | ICD-10-CM

## 2021-10-13 DIAGNOSIS — F411 Generalized anxiety disorder: Secondary | ICD-10-CM | POA: Diagnosis not present

## 2021-10-13 DIAGNOSIS — I10 Essential (primary) hypertension: Secondary | ICD-10-CM | POA: Diagnosis not present

## 2021-10-13 NOTE — Progress Notes (Signed)
Provider:  Alain Honey, MD  Careteam: Patient Care Team: [name], MD as PCP - General (Family Medicine) [name], MD as Consulting Physician (Ophthalmology)  PLACE OF SERVICE:  [place]  Advanced Directive information    Allergies  Allergen Reactions   Imdur [Isosorbide Nitrate] Other (See Comments)    Pt reports causes dizziness and headache    Chief Complaint  Patient presents with   Medical Management of Chronic Issues    Patient presents today for a 6 month follow-up.   Quality Metric Gaps    Eye & foot exam, pneumonia, COVID booster #5     HPI: Patient is a 88 y.o. male patient is here for 43-monthfollow-up chronic problems including anxiety hypertension, hypothyroidism, and hyperlipidemia.  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  He does not get out much on his own.  He provides the meals picking up fast food from various places. He does complain of some headache today.  He denies visual symptoms. Had blood work last week we reviewed that while he was present today overall labs look good Review of Systems:  Review of Systems  Constitutional: Negative.   HENT: Negative.    Eyes: Negative.   Respiratory: Negative.    Cardiovascular: Negative.   Gastrointestinal: Negative.   Genitourinary: Negative.   Neurological: Negative.   Psychiatric/Behavioral: Negative.    All other systems reviewed and are negative.   Past Medical History:  Diagnosis Date   Bladder neck obstruction 03/28/2008   Closed rib fracture    Contact with or exposure to tuberculosis 03/15/1992   Elevated prostate specific antigen (PSA) 03/15/1998   Gastric ulcer, unspecified as acute or chronic, without mention of hemorrhage, perforation, or obstruction 03/15/1993   Hypertension 07/08/1998   Hypertrophy of prostate without urinary obstruction and other lower urinary tract symptoms (LUTS) 03/15/1989   Impotence of organic  origin 03/15/1998   Inguinal hernia without mention of obstruction or gangrene, unilateral or unspecified, (not specified as recurrent) 11/20/2008   Insomnia, unspecified 11/01/2003   Kidney calculi 08/20/2009   Other and unspecified hyperlipidemia 12/23/2004   Other malaise and fatigue 12/23/2004   Pain in joint, pelvic region and thigh 12/17/2009   Pain in joint, shoulder region 11/01/2003   Peptic ulcer, unspecified site, unspecified as acute or chronic, without mention of hemorrhage, perforation, or obstruction 06/25/2003   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 TLennie Hummer  HERNIA REPAIR Bilateral 11/2008   inguinal   PILONIDAL CYST EXCISION  1963   PROSTATE SURGERY     TOTAL HIP ARTHROPLASTY Right 07/10/2009   Dr. RTelford Nab  Social History:   reports that he quit smoking about 43 years ago. His smoking use included cigarettes. He has never used smokeless tobacco. He reports current alcohol use. He reports that he does not use drugs.  Family History  Problem Relation Age of Onset   Heart disease Father     Medications: Patient's Medications  New Prescriptions  No medications on file  Previous Medications   ACETAMINOPHEN (TYLENOL) 325 MG TABLET    Take 2 tablets (650 mg total) by mouth every 6 (six) hours as needed for mild pain (or Fever >/= 101).   ALPRAZOLAM (XANAX) 0.25 MG TABLET    TAKE 1 TABLET(0.25 MG) BY MOUTH THREE TIMES DAILY   AMLODIPINE (NORVASC) 5 MG TABLET    Take one tablet by mouth once daily.   ASPIRIN 81  MG TABLET    Take 1 tablet (81 mg total) by mouth daily.   CHOLECALCIFEROL (VITAMIN D PO)    Take 1,000 Units by mouth daily.    LEVOTHYROXINE (SYNTHROID) 75 MCG TABLET    Take 1 tablet (75 mcg total) by mouth daily before breakfast.   LOSARTAN (COZAAR) 100 MG TABLET    Take one tablet by mouth once daily.   NITROGLYCERIN (NITRODUR - DOSED IN MG/24 HR) 0.2 MG/HR PATCH    Apply 1/4 of the patch to affected area daily for 1 month. Remove old patch.   NITROGLYCERIN (NITROSTAT) 0.4 MG SL TABLET    Place 1 tablet (0.4 mg total) under the tongue every 5 (five) minutes as needed for chest pain.   ONETOUCH DELICA LANCETS 51W MISC    Check blood sugar once daily as directed E11.22   ONETOUCH ULTRA TEST STRIP    USE TO CHECK BLOOD SUGAR ONCE DAILY AND AS DIRECTED   ROSUVASTATIN (CRESTOR) 20 MG TABLET    Take 1 tablet (20 mg total) by mouth daily.   TEMAZEPAM (RESTORIL) 15 MG CAPSULE    Take one capsule by mouth at bedtime as needed for sleep.   TRIAMCINOLONE (KENALOG) 0.1 %    APPLY EXTERNALLY TO RASH TWICE DAILY AS NEEDED  Modified Medications   No medications on file  Discontinued Medications   No medications on file    Physical Exam:  There were no vitals filed for this visit. There is no height or weight on file to calculate BMI. Wt Readings from Last 3 Encounters:  10/07/21 141 lb 12.8 oz (64.3 kg)  05/12/21 147 lb 12.8 oz (67 kg)  04/15/21 146 lb (66.2 kg)    Physical Exam Vitals and nursing note reviewed.  Constitutional:      Appearance: Normal appearance.  Cardiovascular:     Rate and Rhythm: Normal rate and regular rhythm.  Pulmonary:     Effort: Pulmonary effort is normal.     Breath sounds: Normal breath sounds.  Musculoskeletal:        General: Normal range of motion.  Skin:    General: Skin is warm and dry.  Neurological:     General: No focal deficit present.     Mental Status: He is alert and oriented to person, place, and time.  Psychiatric:        Mood and Affect:  Mood normal.        Behavior: Behavior normal.        Thought Content: Thought content normal.        Judgment: Judgment normal.     Labs reviewed: Basic Metabolic Panel: Recent Labs    04/02/21 0826 10/07/21 0832  NA 138 141  K 4.0 4.4  CL 105 108  CO2 27 26  GLUCOSE 92 93  BUN 24 23  CREATININE 1.12 1.01  CALCIUM 9.1 9.1  TSH  --  1.84   Liver Function Tests: Recent Labs    04/02/21 0826 10/07/21 0832  AST 11 14  ALT  6* 11  BILITOT 0.9 0.6  PROT 6.5 6.5   No results for input(s): "LIPASE", "AMYLASE" in the last 8760 hours. No results for input(s): "AMMONIA" in the last 8760 hours. CBC: No results for input(s): "WBC", "NEUTROABS", "HGB", "HCT", "MCV", "PLT" in the last 8760 hours. Lipid Panel: Recent Labs    10/07/21 0832  CHOL 127  HDL 54  LDLCALC 60  TRIG 57  CHOLHDL 2.4   TSH: Recent Labs    10/07/21 0832  TSH 1.84   A1C: Lab Results  Component Value Date   HGBA1C 5.8 (H) 10/07/2021     Assessment/Plan  1. Anxiety state  continue with as needed alprazolam   2. Essential hypertension Blood pressure is good on losartan and amlodipine  3. Hyperlipidemia, unspecified hyperlipidemia type Take Crestor 20 mg lipids are at goal   Alain Honey, MD [place] [phone]

## 2021-11-12 ENCOUNTER — Other Ambulatory Visit: Payer: Self-pay

## 2021-11-12 MED ORDER — LOSARTAN POTASSIUM 100 MG PO TABS: ORAL_TABLET | ORAL | 1 refills | Status: DC

## 2021-12-24 ENCOUNTER — Other Ambulatory Visit: Payer: Self-pay | Admitting: Family

## 2021-12-24 DIAGNOSIS — G4709 Other insomnia: Secondary | ICD-10-CM

## 2021-12-24 MED ORDER — TEMAZEPAM 15 MG PO CAPS: ORAL_CAPSULE | ORAL | 3 refills | Status: DC

## 2022-01-11 ENCOUNTER — Other Ambulatory Visit: Payer: Self-pay | Admitting: *Deleted

## 2022-01-11 DIAGNOSIS — E038 Other specified hypothyroidism: Secondary | ICD-10-CM

## 2022-01-11 MED ORDER — LEVOTHYROXINE SODIUM 75 MCG PO TABS: 75.0000 ug | ORAL_TABLET | Freq: Every day | ORAL | 1 refills | Status: DC

## 2022-01-11 NOTE — Telephone Encounter (Signed)
Pharmacy requested refill

## 2022-04-05 ENCOUNTER — Telehealth: Payer: Self-pay

## 2022-04-05 NOTE — Telephone Encounter (Signed)
Patient called stating he needs to change his pharmacy to [facility] on [address].  Request complete

## 2022-05-04 ENCOUNTER — Telehealth: Payer: Self-pay

## 2022-05-04 ENCOUNTER — Encounter: Payer: Self-pay | Admitting: Nurse Practitioner

## 2022-05-04 ENCOUNTER — Telehealth (INDEPENDENT_AMBULATORY_CARE_PROVIDER_SITE_OTHER): Payer: Medicare Other | Admitting: Nurse Practitioner

## 2022-05-04 DIAGNOSIS — U071 COVID-19: Secondary | ICD-10-CM

## 2022-05-04 MED ORDER — NIRMATRELVIR/RITONAVIR (PAXLOVID)TABLET: 3.0000 | ORAL_TABLET | Freq: Two times a day (BID) | ORAL | 0 refills | Status: AC

## 2022-05-04 NOTE — Patient Instructions (Addendum)
-  recommended to take Vit C 1000 mg twice daily, Vit D 2000 units daily and zinc 50 mg daily for 7 days -maintain proper hydration -tylenol 650 mg by mouth every 6-8 hours as needed fever/body aches.  -mucinex DM twice daily as needed for cough and chest congestion -do not sit in bed all day, sit up in chair and walk around as tolerated -nasal wash daily and nasal saline as needed.

## 2022-05-04 NOTE — Telephone Encounter (Signed)
[name], Bobby Jensen are scheduled for a virtual visit with your provider today.    Just as we do with appointments in the office, we must obtain your consent to participate.  Your consent will be active for this visit and any virtual visit you may have with one of our providers in the next 365 days.    If you have a MyChart account, I can also send a copy of this consent to you electronically.  All virtual visits are billed to your insurance company just like a traditional visit in the office.  As this is a virtual visit, video technology does not allow for your provider to perform a traditional examination.  This may limit your provider's ability to fully assess your condition.  If your provider identifies any concerns that need to be evaluated in person or the need to arrange testing such as labs, EKG, etc, we will make arrangements to do so.    Although advances in technology are sophisticated, we cannot ensure that it will always work on either your end or our end.  If the connection with a video visit is poor, we may have to switch to a telephone visit.  With either a video or telephone visit, we are not always able to ensure that we have a secure connection.   I need to obtain your verbal consent now.   Are you willing to proceed with your visit today?   Bobby Jensen has provided verbal consent on 05/04/2022 for a virtual visit (video or telephone).   Leigh [place], [place] 05/04/2022  8:26 AM

## 2022-05-04 NOTE — Progress Notes (Signed)
   This service is provided via telemedicine  No vital signs collected/recorded due to the encounter was a telemedicine visit.   Location of patient (ex: home, work):  Home  Patient consents to a telephone visit: Yes, see telephone visit dated 05/04/22   Location of the provider (ex: office, home):  [place], [place] Location    Name of any referring provider:  N/A  Names of all persons participating in the telemedicine service and their role in the encounter:  S.Chrae B/CMA, Sherrie Mustache, NP, and Curt Bears (granddaughter), Patient   Time spent on call:  9 min with medical assistant

## 2022-05-04 NOTE — Progress Notes (Signed)
Careteam: Patient Care Team: Bobby Honour, MD as PCP - General (Family Medicine) Bobby Mutter, MD as Consulting Physician (Ophthalmology)  Advanced Directive information    Allergies  Allergen Reactions   Imdur [Isosorbide Nitrate] Other (See Comments)    Pt reports causes dizziness and headache    Chief Complaint  Patient presents with   Acute Visit    Patient tested positive on 05/03/2022 for covid and would like to discuss symptoms and treatment. Patient with sore throat, head ache, productive cough, vomiting, fever and incontinence (urine).      HPI: Patient is a 89 y.o. male who tested positive for covid yesterday.  Has not taken temperature but feels feverish - achy with chills.  Feeling worse today than yesterday.  Taking ASA for symptoms.  Increase weakness.  No shortness of breath.  Cough and chest congestion  No chest pain.  Also having nausea and threw up a little this morning after medication.    Review of Systems:  Review of Systems  Constitutional:  Positive for chills, fever and malaise/fatigue. Negative for weight loss.  HENT:  Positive for sore throat. Negative for sinus pain and tinnitus.   Respiratory:  Positive for cough and sputum production. Negative for shortness of breath.   Cardiovascular:  Negative for chest pain, palpitations and leg swelling.  Gastrointestinal:  Negative for abdominal pain, constipation, diarrhea and heartburn.  Genitourinary:  Negative for dysuria, frequency and urgency.  Musculoskeletal:  Negative for back pain, falls, joint pain and myalgias.  Skin: Negative.   Neurological:  Negative for dizziness and headaches.  Psychiatric/Behavioral:  Negative for depression and memory loss. The patient does not have insomnia.     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   Inguinal hernia without mention of obstruction or gangrene, unilateral or unspecified, (not specified as recurrent) 11/20/2008   Insomnia, unspecified 11/01/2003   Kidney calculi 08/20/2009   Other and unspecified hyperlipidemia 12/23/2004   Other malaise and fatigue 12/23/2004   Pain in joint, pelvic region and thigh 12/17/2009   Pain in joint, shoulder region 11/01/2003   Peptic ulcer, unspecified site, unspecified as acute or chronic, without mention of hemorrhage, perforation, or obstruction 06/25/2003   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 Lennie Hummer   HERNIA REPAIR Bilateral 11/2008   inguinal   PILONIDAL CYST EXCISION  1963   PROSTATE SURGERY     TOTAL HIP ARTHROPLASTY Right 07/10/2009   Dr. Telford Nab   Social History:   reports that he quit smoking about 43 years ago. His smoking use included cigarettes. He has never used smokeless tobacco. He  reports current alcohol use. He reports that he does not use drugs.  Family History  Problem Relation Age of Onset   Heart disease Father     Medications: Patient's Medications  New Prescriptions   No medications on file  Previous Medications   ALPRAZOLAM (XANAX)  0.25 MG TABLET    Take 0.25 mg by mouth as needed for anxiety.   AMLODIPINE (NORVASC) 5 MG TABLET    Take one tablet by mouth once daily.   ASPIRIN 81 MG TABLET    Take 1 tablet (81 mg total) by mouth daily.   CHOLECALCIFEROL (VITAMIN D PO)    Take 1,000 Units by mouth daily.    LEVOTHYROXINE (SYNTHROID) 75 MCG TABLET    Take 1 tablet (75 mcg total) by mouth daily before breakfast.   LOSARTAN (COZAAR) 100 MG TABLET    Take one tablet by mouth once daily.   NITROGLYCERIN (NITRODUR - DOSED IN MG/24 HR) 0.2 MG/HR PATCH    Place onto the skin. 1/4 or 1/2 patch as needed for shoulder pain   NITROGLYCERIN (NITROSTAT) 0.4 MG SL TABLET    Place 1 tablet (0.4 mg total) under the tongue every 5 (five) minutes as needed for chest pain.   ONETOUCH DELICA LANCETS 99991111 MISC    Check blood sugar once daily as directed E11.22   ONETOUCH ULTRA TEST STRIP    USE TO CHECK BLOOD SUGAR ONCE DAILY AND AS DIRECTED   ROSUVASTATIN (CRESTOR) 20 MG TABLET    Take 1 tablet (20 mg total) by mouth daily.   TEMAZEPAM (RESTORIL) 15 MG CAPSULE    TAKE 1 CAPSULE BY MOUTH AT BEDTIME AS NEEDED FOR SLEEP   TRIAMCINOLONE (KENALOG) 0.1 %    APPLY EXTERNALLY TO RASH TWICE DAILY AS NEEDED  Modified Medications   No medications on file  Discontinued Medications   ACETAMINOPHEN (TYLENOL) 325 MG TABLET    Take 2 tablets (650 mg total) by mouth every 6 (six) hours as needed for mild pain (or Fever >/= 101).   ALPRAZOLAM (XANAX) 0.25 MG TABLET    TAKE 1 TABLET(0.25 MG) BY MOUTH THREE TIMES DAILY   NITROGLYCERIN (NITRODUR - DOSED IN MG/24 HR) 0.2 MG/HR PATCH    Apply 1/4 of the patch to affected area daily for 1 month. Remove old patch.    Physical Exam:  There were no vitals filed for this visit. There is no height or weight on file to calculate BMI. Wt Readings from Last 3 Encounters:  10/13/21 142 lb (64.4 kg)  10/07/21 141 lb 12.8 oz (64.3 kg)  05/12/21 147 lb 12.8 oz (67 kg)    Physical Exam Constitutional:      Appearance:  Normal appearance.  Neurological:     Mental Status: He is alert. Mental status is at baseline.  Psychiatric:        Mood and Affect: Mood normal.     Labs reviewed: Basic Metabolic Panel: Recent Labs    10/07/21 0832  NA 141  K 4.4  CL 108  CO2 26  GLUCOSE 93  BUN 23  CREATININE 1.01  CALCIUM 9.1  TSH 1.84   Liver Function Tests: Recent Labs    10/07/21 0832  AST 14  ALT 11  BILITOT 0.6  PROT 6.5   No results for input(s): "LIPASE", "AMYLASE" in the last 8760 hours. No results for input(s): "AMMONIA" in the last 8760 hours. CBC: No results for input(s): "WBC", "NEUTROABS", "HGB", "HCT", "MCV", "PLT" in the last 8760  hours. Lipid Panel: Recent Labs    10/07/21 0832  CHOL 127  HDL 54  LDLCALC 60  TRIG 57  CHOLHDL 2.4   TSH: Recent Labs    10/07/21 0832  TSH 1.84   A1C: Lab Results  Component Value Date   HGBA1C 5.8 (H) 10/07/2021     Assessment/Plan 1. COVID -recommended to take Vit C 1000 mg twice daily, Vit D 5000 units daily and zinc 50 mg daily for 7 days -maintain proper hydration -tylenol 650 mg by mouth every 6-8 hours as needed fever/body aches.  -mucinex DM twice daily as needed for cough and chest congestion -do not sit in bed all day, sit up in chair and walk around as tolerated -nasal wash daily and nasal saline as needed.  - nirmatrelvir/ritonavir (PAXLOVID) 20 x 150 MG & 10 x 100MG TABS; Take 3 tablets by mouth 2 (two) times daily for 5 days. (Take nirmatrelvir 150 mg two tablets twice daily for 5 days and ritonavir 100 mg one tablet twice daily for 5 days) Patient GFR is 70  Dispense: 30 tablet; Refill: 0 -Your test for COVID-19 was positive, meaning that you were infected with the coronavirus and could give the germ to others.  Please continue isolation at home for at least 5 days since the start of your symptoms. On day 5, as long as your symptoms are improving and you are not having a fever, you can return to normal activities,  ensuring you are wearing a mask at all times when not at home.   Once you complete your 10 days you are good to resume normal acitivities.   -education provided on when to follow up and when to seek immediate medication attention through the ED.  Bobby Jensen. Harle Battiest  [facility] [phone]    Virtual Visit via Deloris Ping  I connected with patient on 05/04/22 at  8:20 AM EST by video and verified that I am speaking with the correct person using two identifiers.  Location: Patient: home Provider: [place]    I discussed the limitations, risks, security and privacy concerns of performing an evaluation and management service by telephone and the availability of in person appointments. I also discussed with the patient that there may be a patient responsible charge related to this service. The patient expressed understanding and agreed to proceed.   I discussed the assessment and treatment plan with the patient. The patient was provided an opportunity to ask questions and all were answered. The patient agreed with the plan and demonstrated an understanding of the instructions.   The patient was advised to call back or seek an in-person evaluation if the symptoms worsen or if the condition fails to improve as anticipated.  I provided 15 minutes of non-face-to-face time during this encounter.  Bobby Jensen. Harle Battiest Avs printed and mailed

## 2022-05-05 ENCOUNTER — Telehealth: Payer: Self-pay

## 2022-05-05 NOTE — Telephone Encounter (Signed)
Message left on clinical intake voicemail at 4:49 pm yesterday,  Patient is now coughing up dark phlegm and Bobby Jensen would like a directive.  Please advise

## 2022-05-05 NOTE — Telephone Encounter (Signed)
If he is unable to keep down fluids/symptoms worsening he needs to go to the hospital

## 2022-05-05 NOTE — Telephone Encounter (Signed)
To continue recommendations as directed- mucinex Dm with help with mucous  Encourage proper hydration -- to drink plenty of water.

## 2022-05-05 NOTE — Telephone Encounter (Signed)
Discussed response with Curt Bears. Patient is scared to take mucinex as he had an episode of vomiting after taking paxolvid last night (had a banana) and diarrhea. Patient is struggling to eat due to not feeling well. Curt Bears will try to encourage her grandfather to take mucinex, eat, and drink fluids to regain his strength.

## 2022-05-06 ENCOUNTER — Other Ambulatory Visit: Payer: Self-pay | Admitting: *Deleted

## 2022-05-06 MED ORDER — LOSARTAN POTASSIUM 100 MG PO TABS: ORAL_TABLET | ORAL | 1 refills | Status: AC

## 2022-05-06 NOTE — Telephone Encounter (Signed)
Spoke with patient and he said he is getting slightly better

## 2022-05-06 NOTE — Telephone Encounter (Signed)
Pharmacy requested refill. 

## 2022-05-12 ENCOUNTER — Telehealth: Payer: Self-pay

## 2022-05-12 NOTE — Telephone Encounter (Signed)
Patient granddaughter was calling in reference of her grandfather has improved after testing positive for Covid but is still coughing up mucous. Patient is unable to Muicnex please advise.

## 2022-05-12 NOTE — Telephone Encounter (Signed)
If unable to take Mucinex, take plain Robitussin or just drink plent fluids

## 2022-05-13 ENCOUNTER — Ambulatory Visit (INDEPENDENT_AMBULATORY_CARE_PROVIDER_SITE_OTHER): Payer: Medicare Other | Admitting: Orthopedic Surgery

## 2022-05-13 ENCOUNTER — Encounter: Payer: Self-pay | Admitting: Orthopedic Surgery

## 2022-05-13 VITALS — BP 120/60 | HR 74 | Temp 97.3°F | Resp 16 | Ht 64.0 in | Wt 137.2 lb

## 2022-05-13 DIAGNOSIS — J029 Acute pharyngitis, unspecified: Secondary | ICD-10-CM | POA: Diagnosis not present

## 2022-05-13 DIAGNOSIS — J0110 Acute frontal sinusitis, unspecified: Secondary | ICD-10-CM | POA: Diagnosis not present

## 2022-05-13 DIAGNOSIS — J40 Bronchitis, not specified as acute or chronic: Secondary | ICD-10-CM

## 2022-05-13 MED ORDER — LORATADINE 10 MG PO CAPS: 10.0000 mg | ORAL_CAPSULE | Freq: Every day | ORAL | 0 refills | Status: DC

## 2022-05-13 MED ORDER — PREDNISONE 20 MG PO TABS: 20.0000 mg | ORAL_TABLET | Freq: Every day | ORAL | 0 refills | Status: AC

## 2022-05-13 NOTE — Patient Instructions (Addendum)
Prednisone prescribed to help with cough and sore throat> always take in morning  Loratadine (also known as Claritin) take one pill at night> will help with nasal congestion  Contact doctor if symptoms do not resolve

## 2022-05-13 NOTE — Progress Notes (Signed)
Careteam: Patient Care Team: [name], MD as PCP - General (Family Medicine) [name], MD as Consulting Physician (Ophthalmology)  Seen by: Windell Moulding, AGNP-C  PLACE OF SERVICE:  [place] Directive information Does Patient Have a Medical Advance Directive?: Yes, Type of Advance Directive: Out of facility DNR (pink MOST or yellow form), Does patient want to make changes to medical advance directive?: No - Patient declined  Allergies  Allergen Reactions   Imdur [Isosorbide Nitrate] Other (See Comments)    Pt reports causes dizziness and headache    Chief Complaint  Patient presents with   Acute Visit    Patient complains of testing [ positive ] for Covid-19 on 05/03/2022. Patient completed paxlovid and still having cough, and insomnia.     HPI: Patient is a 89 y.o. male seen today for acute visit due to cough and sore throat.   02/20 he tested positive for covid and prescribed Paxlovid. He began to feel better, but reports increased dry cough, nasal congestion and sore throat began 2 days ago. He has been taking mucinex without relief. Denies fever, body aches, and N/V and ear pain. He is able to swalloe foods and fluids without difficulty. He is most upset dry cough is keeping him up at night.   Review of Systems:  Review of Systems  Constitutional:  Positive for malaise/fatigue. Negative for chills and fever.  HENT:  Positive for congestion and sore throat. Negative for ear pain and sinus pain.   Eyes:  Negative for discharge and redness.  Respiratory:  Positive for cough. Negative for sputum production, shortness of breath and wheezing.   Cardiovascular:  Negative for chest pain and leg swelling.  Gastrointestinal:  Negative for nausea and vomiting.  Musculoskeletal:  Positive for myalgias.  Neurological:  Negative for dizziness and headaches.  Psychiatric/Behavioral:  Negative for depression. The patient is not nervous/anxious.    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   Inguinal hernia without mention of obstruction or gangrene, unilateral or unspecified, (not specified as recurrent) 11/20/2008   Insomnia, unspecified 11/01/2003   Kidney calculi 08/20/2009   Other and unspecified hyperlipidemia 12/23/2004   Other malaise and fatigue 12/23/2004   Pain in joint, pelvic region and thigh 12/17/2009   Pain in joint, shoulder region 11/01/2003   Peptic ulcer, unspecified site, unspecified as acute or chronic, without mention of hemorrhage, perforation, or obstruction 06/25/2003   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 Lennie Hummer   HERNIA REPAIR Bilateral 11/2008   inguinal   PILONIDAL CYST EXCISION  1963   PROSTATE SURGERY  TOTAL HIP ARTHROPLASTY Right 07/10/2009   Dr. Telford Nab   Social History:   reports that he quit smoking about 43 years ago. His smoking use included cigarettes. He has never used smokeless tobacco. He reports current alcohol use. He reports that he does not use drugs.  Family History   Problem Relation Age of Onset   Heart disease Father     Medications: Patient's Medications  New Prescriptions   No medications on file  Previous Medications   ALPRAZOLAM (XANAX) 0.25 MG TABLET    Take 0.25 mg by mouth as needed for anxiety.   AMLODIPINE (NORVASC) 5 MG TABLET    Take one tablet by mouth once daily.   ASPIRIN 81 MG TABLET    Take 1 tablet (81 mg total) by mouth daily.   CHOLECALCIFEROL (VITAMIN D PO)    Take 1,000 Units by mouth daily.    LEVOTHYROXINE (SYNTHROID) 75 MCG TABLET    Take 1 tablet (75 mcg total) by mouth daily before breakfast.   LOSARTAN (COZAAR) 100 MG TABLET    Take one tablet by mouth once daily.   NITROGLYCERIN (NITROSTAT) 0.4 MG SL TABLET    Place 1 tablet (0.4 mg total) under the tongue every 5 (five) minutes as needed for chest pain.   ONETOUCH DELICA LANCETS 99991111 MISC    Check blood sugar once daily as directed E11.22   ONETOUCH ULTRA TEST STRIP    USE TO CHECK BLOOD SUGAR ONCE DAILY AND AS DIRECTED   ROSUVASTATIN (CRESTOR) 20 MG TABLET    Take 1 tablet (20 mg total) by mouth daily.   TEMAZEPAM (RESTORIL) 15 MG CAPSULE    TAKE 1 CAPSULE BY MOUTH AT BEDTIME AS NEEDED FOR SLEEP   TRIAMCINOLONE (KENALOG) 0.1 %    APPLY EXTERNALLY TO RASH TWICE DAILY AS NEEDED  Modified Medications   No medications on file  Discontinued Medications   NITROGLYCERIN (NITRODUR - DOSED IN MG/24 HR) 0.2 MG/HR PATCH    Place onto the skin. 1/4 or 1/2 patch as needed for shoulder pain    Physical Exam:  Vitals:   05/13/22 1352  BP: 120/60  Pulse: 74  Resp: 16  Temp: (!) 97.3 F (36.3 C)  SpO2: 98%  Weight: 137 lb 3.2 oz (62.2 kg)  Height: '5\' 4"'$  (1.626 m)   Body mass index is 23.55 kg/m. Wt Readings from Last 3 Encounters:  05/13/22 137 lb 3.2 oz (62.2 kg)  10/13/21 142 lb (64.4 kg)  10/07/21 141 lb 12.8 oz (64.3 kg)    Physical Exam Vitals reviewed.  Constitutional:      General: He is not in acute distress. HENT:     Head: Normocephalic.     Right  Ear: There is no impacted cerumen.     Left Ear: There is no impacted cerumen.     Nose:     Right Turbinates: Enlarged. Not swollen.     Left Turbinates: Enlarged. Not swollen.     Right Sinus: No maxillary sinus tenderness or frontal sinus tenderness.     Left Sinus: No maxillary sinus tenderness or frontal sinus tenderness.     Mouth/Throat:     Mouth: Mucous membranes are moist.     Pharynx: Posterior oropharyngeal erythema present.  Eyes:     General:        Right eye: No discharge.        Left eye: No discharge.  Cardiovascular:     Rate and Rhythm: Normal rate and regular  rhythm.     Pulses: Normal pulses.     Heart sounds: Normal heart sounds.  Pulmonary:     Effort: Pulmonary effort is normal. No respiratory distress.     Breath sounds: Normal breath sounds. No wheezing or rales.  Abdominal:     General: Bowel sounds are normal. There is no distension.     Palpations: Abdomen is soft.     Tenderness: There is no abdominal tenderness.  Musculoskeletal:     Cervical back: Neck supple.     Right lower leg: No edema.     Left lower leg: No edema.  Skin:    General: Skin is warm and dry.     Capillary Refill: Capillary refill takes less than 2 seconds.  Neurological:     General: No focal deficit present.     Mental Status: He is alert and oriented to person, place, and time.  Psychiatric:        Mood and Affect: Mood normal.        Behavior: Behavior normal.     Labs reviewed: Basic Metabolic Panel: Recent Labs    10/07/21 0832  NA 141  K 4.4  CL 108  CO2 26  GLUCOSE 93  BUN 23  CREATININE 1.01  CALCIUM 9.1  TSH 1.84   Liver Function Tests: Recent Labs    10/07/21 0832  AST 14  ALT 11  BILITOT 0.6  PROT 6.5   No results for input(s): "LIPASE", "AMYLASE" in the last 8760 hours. No results for input(s): "AMMONIA" in the last 8760 hours. CBC: No results for input(s): "WBC", "NEUTROABS", "HGB", "HCT", "MCV", "PLT" in the last 8760 hours. Lipid  Panel: Recent Labs    10/07/21 0832  CHOL 127  HDL 54  LDLCALC 60  TRIG 57  CHOLHDL 2.4   TSH: Recent Labs    10/07/21 0832  TSH 1.84   A1C: Lab Results  Component Value Date   HGBA1C 5.8 (H) 10/07/2021     Assessment/Plan 1. Bronchitis - 02/20 + covid> prescribed Paxlovid - ? Paxlovid rebound symptoms versus rebound bronchitis - exam unremarkable - start prednisone to calm cough - advised to contact PCP if symptoms worsen or do not improve - predniSONE (DELTASONE) 20 MG tablet; Take 1 tablet (20 mg total) by mouth daily with breakfast for 7 days.  Dispense: 7 tablet; Refill: 0  2. Pharyngitis, unspecified etiology - see above - exam unremarkable - start prednisone to help with symptoms  3. Acute frontal sinusitis, recurrence not specified - see above - clear nasal congestion, turbinates mildly swollen - recommend antihistamine to dry secretions - Loratadine 10 MG CAPS; Take 1 capsule (10 mg total) by mouth at bedtime.  Dispense: 30 capsule; Refill: 0  Total time: 25 minutes. Greater than 50% of total time spent doing patient education regarding covid, bronchitis, sore throat, and nasal congestion including symptom/medication management.     Next appt: Visit date not found  [place], [facility] [phone]

## 2022-05-26 ENCOUNTER — Ambulatory Visit (INDEPENDENT_AMBULATORY_CARE_PROVIDER_SITE_OTHER): Payer: Medicare Other | Admitting: Family Medicine

## 2022-05-26 ENCOUNTER — Encounter: Payer: Self-pay | Admitting: Family Medicine

## 2022-05-26 VITALS — BP 120/70 | HR 68 | Temp 97.4°F | Resp 16 | Ht 64.0 in | Wt 136.2 lb

## 2022-05-26 DIAGNOSIS — Z634 Disappearance and death of family member: Secondary | ICD-10-CM | POA: Diagnosis not present

## 2022-05-26 DIAGNOSIS — G4700 Insomnia, unspecified: Secondary | ICD-10-CM

## 2022-05-26 DIAGNOSIS — F4321 Adjustment disorder with depressed mood: Secondary | ICD-10-CM | POA: Diagnosis not present

## 2022-05-26 DIAGNOSIS — F411 Generalized anxiety disorder: Secondary | ICD-10-CM | POA: Diagnosis not present

## 2022-05-26 DIAGNOSIS — E785 Hyperlipidemia, unspecified: Secondary | ICD-10-CM | POA: Diagnosis not present

## 2022-05-26 DIAGNOSIS — G510 Bell's palsy: Secondary | ICD-10-CM | POA: Diagnosis not present

## 2022-05-26 NOTE — Progress Notes (Signed)
Provider:  Alain Honey, MD  Careteam: Patient Care Team: [name], MD as PCP - General (Family Medicine) [name], MD as Consulting Physician (Ophthalmology)  PLACE OF SERVICE:  [place] Directive information Does Patient Have a Medical Advance Directive?: Yes, Type of Advance Directive: Out of facility DNR (pink MOST or yellow form), Does patient want to make changes to medical advance directive?: No - Patient declined  Allergies  Allergen Reactions   Imdur [Isosorbide Nitrate] Other (See Comments)    Pt reports causes dizziness and headache    Chief Complaint  Patient presents with   Medical Management of Chronic Issues    6 month follow up.   Health Maintenance    Discuss the need for Eye exam, AWV, Foot exam, and Hemoglobin A1C.    Immunizations    Discuss the need for Pne vaccine, Covid Booster, and Influenza vaccine.     HPI: Patient is a 89 y.o. male patient returns for medical management of chronic problems including hypertension, hypothyroidism, hyperlipidemia,.  He has diabetes listed under his diagnosis but that is not appropriate.  At worst he is prediabetic.  He is not on any medication and never has been. He still spends much of his day taking care of his wife.  There is no cooking at their house and he always picks up food mostly fast food.  He longs for some regular food but not interested in Meals on Wheels which he considers charity. He had COVID earlier this year and was left with residual cough.  He was given some prednisone and that seemed to help.  Review of Systems:  Review of Systems  Constitutional:  Positive for weight loss.  HENT: Negative.    Eyes:  Positive for discharge.  Respiratory: Negative.    Cardiovascular: Negative.   Gastrointestinal:  Positive for constipation.  Genitourinary: Negative.   Musculoskeletal: Negative.   Skin: Negative.   Neurological:  Positive for dizziness.   Psychiatric/Behavioral:  The patient is nervous/anxious.   All other systems reviewed and are negative.   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   Inguinal hernia without mention of obstruction or gangrene, unilateral or unspecified, (not specified as recurrent) 11/20/2008   Insomnia, unspecified 11/01/2003   Kidney calculi 08/20/2009   Other and unspecified hyperlipidemia 12/23/2004   Other malaise and fatigue 12/23/2004   Pain in joint, pelvic region and thigh 12/17/2009   Pain in joint, shoulder region 11/01/2003   Peptic ulcer, unspecified site, unspecified as acute or chronic, without mention of hemorrhage, perforation, or obstruction 06/25/2003   Personal history of malaria 03/16/1939   Right rotator cuff tear 07/10/2018   Chronic per imaging 4/20   Right shoulder injury 07/10/2018   Rosacea 08/20/2009   SDH (subdural hematoma) (HCC)    Type II or unspecified type diabetes mellitus without mention of complication, not stated as uncontrolled 07/15/2006   Unspecified constipation 08/05/2010   Unspecified hypothyroidism 12/17/2009   Past Surgical History:  Procedure Laterality Date   APPENDECTOMY  1956   HEMORROIDECTOMY  06/2002  Dr. Lennie Hummer   HERNIA REPAIR Bilateral 11/2008   inguinal   PILONIDAL CYST EXCISION  1963   PROSTATE SURGERY     TOTAL HIP ARTHROPLASTY Right 07/10/2009   Dr. Telford Nab   Social History:   reports that he quit smoking about 43 years ago. His smoking use included cigarettes. He has never  used smokeless tobacco. He reports current alcohol use. He reports that he does not use drugs.  Family History  Problem Relation Age of Onset   Heart disease Father     Medications: Patient's Medications  New Prescriptions   No medications on file  Previous Medications   ALPRAZOLAM (XANAX) 0.25 MG TABLET    Take 0.25 mg by mouth as needed for anxiety.   AMLODIPINE (NORVASC) 5 MG TABLET    Take one tablet by mouth once daily.   ASPIRIN 81 MG TABLET    Take 1 tablet (81 mg total) by mouth daily.   CHOLECALCIFEROL (VITAMIN D PO)    Take 1,000 Units by mouth daily.    LEVOTHYROXINE (SYNTHROID) 75 MCG TABLET    Take 1 tablet (75 mcg total) by mouth daily before breakfast.   LOSARTAN (COZAAR) 100 MG TABLET    Take one tablet by mouth once daily.   NITROGLYCERIN (NITROSTAT) 0.4 MG SL TABLET    Place 1 tablet (0.4 mg total) under the tongue every 5 (five) minutes as needed for chest pain.   ONETOUCH DELICA LANCETS 99991111 MISC    Check blood sugar once daily as directed E11.22   ONETOUCH ULTRA TEST STRIP    USE TO CHECK BLOOD SUGAR ONCE DAILY AND AS DIRECTED   ROSUVASTATIN (CRESTOR) 20 MG TABLET    Take 1 tablet (20 mg total) by mouth daily.   TEMAZEPAM (RESTORIL) 15 MG CAPSULE    TAKE 1 CAPSULE BY MOUTH AT BEDTIME AS NEEDED FOR SLEEP   TRIAMCINOLONE (KENALOG) 0.1 %    APPLY EXTERNALLY TO RASH TWICE DAILY AS NEEDED  Modified Medications   No medications on file  Discontinued Medications   LORATADINE 10 MG CAPS    Take 1 capsule (10 mg total) by mouth at bedtime.    Physical Exam:  Vitals:   05/26/22 1046  BP: 120/70  Pulse: 68  Resp: 16  Temp: (!) 97.4 F (36.3 C)  SpO2: 96%  Weight: 136 lb 3.2 oz (61.8 kg)  Height: '5\' 4"'$  (1.626 m)   Body mass index is 23.38 kg/m. Wt Readings from Last 3 Encounters:  05/26/22 136 lb 3.2 oz (61.8 kg)  05/13/22 137 lb 3.2 oz (62.2 kg)  10/13/21 142 lb (64.4 kg)    Physical Exam Vitals and nursing note reviewed.  Constitutional:       Appearance: Normal appearance.  Cardiovascular:     Rate and Rhythm: Normal rate and regular rhythm.  Pulmonary:     Effort: Pulmonary effort is normal.     Breath sounds: Normal breath sounds.  Abdominal:     General: Bowel sounds are normal.     Palpations: Abdomen is soft.  Musculoskeletal:        General: Normal range of motion.  Neurological:     General: No focal deficit present.     Mental Status: He is alert and oriented to person, place, and time.  Psychiatric:        Mood and Affect: Mood normal.        Behavior: Behavior normal.     Labs reviewed: Basic Metabolic Panel: Recent  Labs    10/07/21 0832  NA 141  K 4.4  CL 108  CO2 26  GLUCOSE 93  BUN 23  CREATININE 1.01  CALCIUM 9.1  TSH 1.84   Liver Function Tests: Recent Labs    10/07/21 0832  AST 14  ALT 11  BILITOT 0.6  PROT 6.5   No results for input(s): "LIPASE", "AMYLASE" in the last 8760 hours. No results for input(s): "AMMONIA" in the last 8760 hours. CBC: No results for input(s): "WBC", "NEUTROABS", "HGB", "HCT", "MCV", "PLT" in the last 8760 hours. Lipid Panel: Recent Labs    10/07/21 0832  CHOL 127  HDL 54  LDLCALC 60  TRIG 57  CHOLHDL 2.4   TSH: Recent Labs    10/07/21 0832  TSH 1.84   A1C: Lab Results  Component Value Date   HGBA1C 5.8 (H) 10/07/2021     Assessment/Plan  1. Anxiety state Continues to take alprazolam 0.251 or 1-1/2/day  2. Bell's palsy Left eye tears.  Has Ramsay Hunt syndrome, chronic problem  3. Grief at loss of child He still mentions loss of his son which was 3 years ago as well as some other children and their spouses.  Grief seems a little prolonged  4. Hyperlipidemia, unspecified hyperlipidemia type Has been taking rosuvastatin but went off of it when he had COVID.  Encouraged to restart   5. Insomnia, unspecified type We had discussion about changing to a different hypnotic at night.  He has been on temazepam for years.  I suggested we  try some mirtazapine which might help his appetite and weight but he has decided he will stay with the temazepam   Alain Honey, MD [place] [phone]

## 2022-06-02 ENCOUNTER — Ambulatory Visit: Payer: Medicare Other | Admitting: Family Medicine

## 2022-06-29 ENCOUNTER — Other Ambulatory Visit: Payer: Self-pay

## 2022-06-29 DIAGNOSIS — G4709 Other insomnia: Secondary | ICD-10-CM

## 2022-06-29 NOTE — Telephone Encounter (Signed)
Patient requesting refill on alprazolam and temazepam.  Medications pended and sent to Dr. Jacalyn Lefevre for approval

## 2022-06-30 MED ORDER — TEMAZEPAM 15 MG PO CAPS: ORAL_CAPSULE | ORAL | 3 refills | Status: DC

## 2022-06-30 MED ORDER — ALPRAZOLAM 0.25 MG PO TABS: 0.2500 mg | ORAL_TABLET | Freq: Two times a day (BID) | ORAL | 2 refills | Status: DC | PRN

## 2022-07-12 ENCOUNTER — Ambulatory Visit: Payer: Medicare Other | Admitting: Cardiology

## 2022-08-05 ENCOUNTER — Ambulatory Visit: Payer: Medicare Other | Admitting: Physician Assistant

## 2022-08-11 ENCOUNTER — Other Ambulatory Visit: Payer: Self-pay

## 2022-08-11 DIAGNOSIS — I10 Essential (primary) hypertension: Secondary | ICD-10-CM

## 2022-08-11 MED ORDER — AMLODIPINE BESYLATE 5 MG PO TABS: ORAL_TABLET | ORAL | 3 refills | Status: AC

## 2022-08-26 ENCOUNTER — Telehealth: Payer: Self-pay

## 2022-08-26 NOTE — Telephone Encounter (Signed)
Called patient to schedule AWV will have to reach back out

## 2022-09-08 ENCOUNTER — Telehealth: Payer: Self-pay

## 2022-09-08 DIAGNOSIS — E038 Other specified hypothyroidism: Secondary | ICD-10-CM

## 2022-09-08 MED ORDER — LEVOTHYROXINE SODIUM 75 MCG PO TABS: 75.0000 ug | ORAL_TABLET | Freq: Every day | ORAL | 1 refills | Status: AC

## 2022-09-08 NOTE — Telephone Encounter (Signed)
Pharmacy send a rx for levothyroxine 75 MG and medication send into pharmacy.

## 2022-10-06 ENCOUNTER — Ambulatory Visit (INDEPENDENT_AMBULATORY_CARE_PROVIDER_SITE_OTHER): Payer: Medicare Other | Admitting: Family Medicine

## 2022-10-06 ENCOUNTER — Encounter: Payer: Self-pay | Admitting: Family Medicine

## 2022-10-06 VITALS — BP 118/72 | HR 58 | Temp 97.3°F | Ht 64.25 in | Wt 137.0 lb

## 2022-10-06 DIAGNOSIS — E785 Hyperlipidemia, unspecified: Secondary | ICD-10-CM

## 2022-10-06 DIAGNOSIS — M25512 Pain in left shoulder: Secondary | ICD-10-CM

## 2022-10-06 DIAGNOSIS — I10 Essential (primary) hypertension: Secondary | ICD-10-CM | POA: Diagnosis not present

## 2022-10-06 DIAGNOSIS — G8929 Other chronic pain: Secondary | ICD-10-CM | POA: Diagnosis not present

## 2022-10-06 DIAGNOSIS — G510 Bell's palsy: Secondary | ICD-10-CM

## 2022-10-06 DIAGNOSIS — F411 Generalized anxiety disorder: Secondary | ICD-10-CM

## 2022-10-06 MED ORDER — METHYLPREDNISOLONE ACETATE 40 MG/ML IJ SUSP: 20.0000 mg | Freq: Once | INTRAMUSCULAR | Status: AC

## 2022-10-06 NOTE — Progress Notes (Signed)
Provider:  Jacalyn Lefevre, MD  Careteam: Patient Care Team: Frederica Kuster, MD as PCP - General (Family Medicine) Maris Berger, MD as Consulting Physician (Ophthalmology)  PLACE OF SERVICE:  [facility]  Advanced Directive information    Allergies  Allergen Reactions   Imdur [Isosorbide Nitrate] Other (See Comments)    Pt reports causes dizziness and headache    Chief Complaint  Patient presents with   Follow-up    Follow-up appointment and foot exam today. Discuss need for pneumonia vaccine, eye exam, AWV, covid booster and A1c.      HPI: Patient is a 89 y.o. male patient is here for medical management of chronic problems including hyperlipidemia, hypertension, hypothyroidism, and anxiety.  He continues to care for his invalid wife.  He has no new complaints today. He does have chronic left shoulder pain and is requesting a shot for that pain  Review of Systems:  Review of Systems  Constitutional: Negative.   HENT: Negative.    Respiratory: Negative.    Cardiovascular: Negative.   Musculoskeletal:  Positive for joint pain.  Neurological: Negative.   Psychiatric/Behavioral:  The patient is nervous/anxious.     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   Inguinal hernia without mention of obstruction or gangrene, unilateral or unspecified, (not specified as recurrent) 11/20/2008   Insomnia, unspecified 11/01/2003   Kidney calculi 08/20/2009   Other and unspecified hyperlipidemia 12/23/2004   Other  malaise and fatigue 12/23/2004   Pain in joint, pelvic region and thigh 12/17/2009   Pain in joint, shoulder region 11/01/2003   Peptic ulcer, unspecified site, unspecified as acute or chronic, without mention of hemorrhage, perforation, or obstruction 06/25/2003   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 Kendrick Ranch   HERNIA REPAIR Bilateral 11/2008   inguinal   PILONIDAL CYST EXCISION  1963   PROSTATE SURGERY     TOTAL HIP ARTHROPLASTY Right 07/10/2009   Dr. Priscille Kluver   Social History:   reports that he quit smoking about 44 years ago. His smoking use included cigarettes. He has never used smokeless tobacco. He reports current alcohol use. He reports that he does not use drugs.  Family History  Problem Relation Age of Onset   Heart disease Father     Medications: Patient's Medications  New Prescriptions   No medications on file  Previous Medications   ALPRAZOLAM (XANAX) 0.25 MG TABLET    Take 1 tablet (0.25 mg total) by mouth 2 (two) times daily as needed for anxiety.   AMLODIPINE (NORVASC) 5 MG TABLET    Take one tablet by mouth once daily.  ASPIRIN 81 MG TABLET    Take 1 tablet (81 mg total) by mouth daily.   CHOLECALCIFEROL (VITAMIN D PO)    Take 1,000 Units by mouth daily.    LEVOTHYROXINE (SYNTHROID) 75 MCG TABLET    Take 1 tablet (75 mcg total) by mouth daily before breakfast.   LOSARTAN (COZAAR) 100 MG TABLET    Take one tablet by mouth once daily.   NITROGLYCERIN (NITROSTAT) 0.4 MG SL TABLET    Place 1 tablet (0.4 mg  total) under the tongue every 5 (five) minutes as needed for chest pain.   ONETOUCH DELICA LANCETS 33G MISC    Check blood sugar once daily as directed E11.22   ONETOUCH ULTRA TEST STRIP    USE TO CHECK BLOOD SUGAR ONCE DAILY AND AS DIRECTED   ROSUVASTATIN (CRESTOR) 20 MG TABLET    Take 1 tablet (20 mg total) by mouth daily.   TEMAZEPAM (RESTORIL) 15 MG CAPSULE    TAKE 1 CAPSULE BY MOUTH AT BEDTIME AS NEEDED FOR SLEEP   TRIAMCINOLONE (KENALOG) 0.1 %    APPLY EXTERNALLY TO RASH TWICE DAILY AS NEEDED  Modified Medications   No medications on file  Discontinued Medications   No medications on file    Physical Exam:  There were no vitals filed for this visit. There is no height or weight on file to calculate BMI. Wt Readings from Last 3 Encounters:  05/26/22 136 lb 3.2 oz (61.8 kg)  05/13/22 137 lb 3.2 oz (62.2 kg)  10/13/21 142 lb (64.4 kg)    Physical Exam Vitals and nursing note reviewed.  Constitutional:      Appearance: Normal appearance.  Cardiovascular:     Rate and Rhythm: Normal rate and regular rhythm.  Pulmonary:     Effort: Pulmonary effort is normal.     Breath sounds: Normal breath sounds.  Musculoskeletal:     Comments: Left shoulder: Increased pain and decreased range of motion with abduction Area of maximal tenderness over the deltoid tendon palpated and then injected with Depo-Medrol and Marcaine 20 mg  Neurological:     Mental Status: He is alert.     Labs reviewed: Basic Metabolic Panel: Recent Labs    10/07/21 0832  NA 141  K 4.4  CL 108  CO2 26  GLUCOSE 93  BUN 23  CREATININE 1.01  CALCIUM 9.1  TSH 1.84   Liver Function Tests: Recent Labs    10/07/21 0832  AST 14  ALT 11  BILITOT 0.6  PROT 6.5   No results for input(s): "LIPASE", "AMYLASE" in the last 8760 hours. No results for input(s): "AMMONIA" in the last 8760 hours. CBC: No results for input(s): "WBC", "NEUTROABS", "HGB", "HCT", "MCV", "PLT" in the last 8760 hours. Lipid  Panel: Recent Labs    10/07/21 0832  CHOL 127  HDL 54  LDLCALC 60  TRIG 57  CHOLHDL 2.4   TSH: Recent Labs    10/07/21 0832  TSH 1.84   A1C: Lab Results  Component Value Date   HGBA1C 5.8 (H) 10/07/2021     Assessment/Plan  1. Bell's palsy Left eye continues to tear  2. Anxiety state Uses alprazolam twice daily as needed  3. Essential hypertension Blood pressure good on combination amlodipine 5 mg and losartan 100 mg  4. Hyperlipidemia, unspecified hyperlipidemia type Need to check lipids at this visit but patient declines.  At goal 1 year ago.  Continue on rosuvastatin 20 mg.  Need to check lipids and TSH  at next visit   Jacalyn Lefevre, MD [facility] [phone]

## 2022-10-08 ENCOUNTER — Other Ambulatory Visit: Payer: Medicare Other

## 2022-10-13 ENCOUNTER — Ambulatory Visit: Payer: Medicare Other | Admitting: Family Medicine

## 2022-10-21 NOTE — Progress Notes (Signed)
Cardiology Office Note:   Date:  10/21/2022  ID:  Bobby Jensen, DOB 08-Mar-1928, MRN [id number] PCP:  Frederica Kuster, MD  [facility] Providers Cardiologist:  Alverda Skeans, MD Referring MD: Frederica Kuster, MD   Chief Complaint/Reason for Referral: Cardiology follow-up ASSESSMENT:    1. Type 2 diabetes mellitus with stage 3a chronic kidney disease, without long-term current use of insulin (HCC)   2. Hypertension associated with diabetes (HCC)   3. Hyperlipidemia associated with type 2 diabetes mellitus (HCC)     PLAN:   In order of problems listed above: 1.  Type 2 diabetes: Continue aspirin, losartan, and Crestor; would defer SGLT2 inhibitor given advanced age. 2.  Hypertension: 3.  Hyperlipidemia: Given advanced age I do not think strict lipid lowering is required.            Dispo:  No follow-ups on file.      Medication Adjustments/Labs and Tests Ordered: Current medicines are reviewed at length with the patient today.  Concerns regarding medicines are outlined above.  The following changes have been made:     Labs/tests ordered: No orders of the defined types were placed in this encounter.   Medication Changes: No orders of the defined types were placed in this encounter.   Current medicines are reviewed at length with the patient today.  The patient  concerns regarding medicines.  History of Present Illness:   FOCUSED PROBLEM LIST:   Type 2 diabetes diet controlled Hypertension Hyperlipidemia Hypothyroidism  The patient is a 89 y.o. male with the indicated medical history here for routine cardiology follow-up.  The patient was last seen in 2023 and was doing well with occasional chest discomfort relieved by nitroglycerin.  At that time a lipid panel was drawn with an LDL of 60.           Current Medications: No outpatient medications have been marked as taking for the 10/27/22 encounter (Appointment) with Orbie Pyo, MD.    Current Facility-Administered Medications for the 10/27/22 encounter (Appointment) with Orbie Pyo, MD  Medication   methylPREDNISolone acetate (DEPO-MEDROL) injection 20 mg     Allergies:    Imdur [isosorbide nitrate]   Social History:   Social History   Tobacco Use   Smoking status: Former    Current packs/day: 0.00    Types: Cigarettes    Quit date: 07/31/1978    Years since quitting: 44.2   Smokeless tobacco: Never  Vaping Use   Vaping status: Never Used  Substance Use Topics   Alcohol use: Yes    Comment: Occ. glass of wine   Drug use: No    Comment: Quit in 1980     Family Hx: Family History  Problem Relation Age of Onset   Heart disease Father      Review of Systems:   Please see the history of present illness.    All other systems reviewed and are negative.     EKGs/Labs/Other Test Reviewed:   EKG:    EKG Interpretation Date/Time:    Ventricular Rate:    PR Interval:    QRS Duration:    QT Interval:    QTC Calculation:   R Axis:      Text Interpretation:          Prior CV studies reviewed: Cardiac Studies & Procedures       ECHOCARDIOGRAM  ECHOCARDIOGRAM COMPLETE 04/24/2021  Narrative ECHOCARDIOGRAM REPORT    Patient Name:   Bobby Jensen Reasoner  Date of Exam: 04/24/2021 Medical Rec #:  [id number]          Height:       65.0 in Accession #:    [id number]         Weight:       146.0 lb Date of Birth:  [birth year]           BSA:          1.730 m Patient Age:    89 years           BP:           124/70 mmHg Patient Gender: M                  HR:           51 bpm. Exam Location:  [address]  Procedure: 2D Echo, Cardiac Doppler and Color Doppler  Indications:    R07.9 Chest pain  History:        Patient has no prior history of Echocardiogram examinations. Arrythmias:Bradycardia; Risk Factors:Hypertension, Diabetes, Dyslipidemia and Former Smoker.  Sonographer:    Jorje Guild BS, RDCS Referring Phys: [id number] [facility] E  [place]  IMPRESSIONS   1. Left ventricular ejection fraction, by estimation, is 55 to 60%. The left ventricle has normal function. The left ventricle has no regional wall motion abnormalities. Left ventricular diastolic parameters are consistent with Grade I diastolic dysfunction (impaired relaxation). 2. Right ventricular systolic function is normal. The right ventricular size is normal. Tricuspid regurgitation signal is inadequate for assessing PA pressure. 3. The mitral valve is normal in structure. Mild mitral valve regurgitation. No evidence of mitral stenosis. 4. The aortic valve is tricuspid. Aortic valve regurgitation is not visualized. Aortic valve sclerosis/calcification is present, without any evidence of aortic stenosis. 5. The inferior vena cava is dilated in size with >50% respiratory variability, suggesting right atrial pressure of 8 mmHg.  FINDINGS Left Ventricle: Left ventricular ejection fraction, by estimation, is 55 to 60%. The left ventricle has normal function. The left ventricle has no regional wall motion abnormalities. The left ventricular internal cavity size was normal in size. There is no left ventricular hypertrophy. Left ventricular diastolic parameters are consistent with Grade I diastolic dysfunction (impaired relaxation).  Right Ventricle: The right ventricular size is normal. No increase in right ventricular wall thickness. Right ventricular systolic function is normal. Tricuspid regurgitation signal is inadequate for assessing PA pressure.  Left Atrium: Left atrial size was normal in size.  Right Atrium: Right atrial size was normal in size.  Pericardium: There is no evidence of pericardial effusion.  Mitral Valve: The mitral valve is normal in structure. Mild mitral valve regurgitation. No evidence of mitral valve stenosis.  Tricuspid Valve: The tricuspid valve is normal in structure. Tricuspid valve regurgitation is trivial.  Aortic Valve: The aortic  valve is tricuspid. Aortic valve regurgitation is not visualized. Aortic valve sclerosis/calcification is present, without any evidence of aortic stenosis.  Pulmonic Valve: The pulmonic valve was not well visualized. Pulmonic valve regurgitation is trivial.  Aorta: The aortic root and ascending aorta are structurally normal, with no evidence of dilitation.  Venous: The inferior vena cava is dilated in size with greater than 50% respiratory variability, suggesting right atrial pressure of 8 mmHg.  IAS/Shunts: The interatrial septum was not well visualized.   LEFT VENTRICLE PLAX 2D LVIDd:         4.80 cm   Diastology LVIDs:         3.40  cm   LV e' medial:    4.78 cm/s LV PW:         0.90 cm   LV E/e' medial:  8.6 LV IVS:        0.80 cm   LV e' lateral:   6.66 cm/s LVOT diam:     2.40 cm   LV E/e' lateral: 6.2 LV SV:         82 LV SV Index:   48 LVOT Area:     4.52 cm   RIGHT VENTRICLE             IVC RV Basal diam:  3.30 cm     IVC diam: 2.30 cm RV S prime:     14.80 cm/s TAPSE (M-mode): 2.3 cm  LEFT ATRIUM             Index        RIGHT ATRIUM           Index LA diam:        4.10 cm 2.37 cm/m   RA Pressure: 8.00 mmHg LA Vol (A2C):   45.5 ml 26.29 ml/m  RA Area:     15.50 cm LA Vol (A4C):   59.6 ml 34.44 ml/m  RA Volume:   41.60 ml  24.04 ml/m LA Biplane Vol: 53.4 ml 30.86 ml/m AORTIC VALVE LVOT Vmax:   80.30 cm/s LVOT Vmean:  53.367 cm/s LVOT VTI:    0.182 m  AORTA Ao Root diam: 3.40 cm Ao Asc diam:  3.60 cm  MITRAL VALVE               TRICUSPID VALVE Estimated RAP:  8.00 mmHg MV Decel Time: 327 msec MV E velocity: 41.10 cm/s  SHUNTS MV A velocity: 97.30 cm/s  Systemic VTI:  0.18 m MV E/A ratio:  0.42        Systemic Diam: 2.40 cm  Epifanio Lesches MD Electronically signed by Epifanio Lesches MD Signature Date/Time: 04/24/2021/5:31:50 PM    Final    MONITORS  LONG TERM MONITOR (3-14 DAYS) 04/29/2021  Narrative  Patch wear time was 2 days and  1 hour  Predominant rhythm was NSR with average HR 55 (ranging from 38-158bpm)  5 episodes of nonsustained SVT with longest episode lasting 5 beats  Frequent SVE (6%, 9880), occasional VE (2.6%, 4353)  No sustained arrhythmias or significant pauses   Patch Wear Time:  2 days and 1 hours (2023-02-05T11:34:52-0500 to 2023-02-07T13:27:21-0500)  Patient had a min HR of 38 bpm, max HR of 158 bpm, and avg HR of 55 bpm. Predominant underlying rhythm was Sinus Rhythm. First Degree AV Block was present. 5 Supraventricular Tachycardia runs occurred, the run with the fastest interval lasting 5 beats with a max rate of 158 bpm, the longest lasting 5 beats with an avg rate of 102 bpm. Isolated SVEs were frequent (6.0%, 9880), SVE Couplets were rare (<1.0%, 107), and SVE Triplets were rare (<1.0%, 24). Isolated VEs were occasional (2.6%, 4353), VE Couplets were rare (<1.0%, 34), and VE Triplets were rare (<1.0%, 1). Ventricular Bigeminy and Trigeminy were present.  Laurance Flatten, MD             Recent Labs: No results found for requested labs within last 365 days.   Lipid Panel    Component Value Date/Time   CHOL 127 10/07/2021 0832   CHOL 158 07/25/2015 0825   TRIG 57 10/07/2021 0832   HDL 54 10/07/2021 0832   HDL 54 07/25/2015  0825   CHOLHDL 2.4 10/07/2021 0832   VLDL 13 06/16/2016 0833   LDLCALC 60 10/07/2021 0832    Risk Assessment/Calculations:         Physical Exam:   VS:  There were no vitals taken for this visit.      Wt Readings from Last 3 Encounters:  10/06/22 137 lb (62.1 kg)  05/26/22 136 lb 3.2 oz (61.8 kg)  05/13/22 137 lb 3.2 oz (62.2 kg)      GENERAL:  No apparent distress, AOx3 HEENT:  No carotid bruits, +2 carotid impulses, no scleral icterus CAR: RRR Irregular RR no murmurs, gallops, rubs, or thrills RES:  Clear to auscultation bilaterally ABD:  Soft, nontender, nondistended, positive bowel sounds x 4 VASC:  +2 radial pulses, +2 carotid  pulses NEURO:  CN 2-12 grossly intact; motor and sensory grossly intact PSYCH:  No active depression or anxiety EXT:  No edema, ecchymosis, or cyanosis  Signed, Orbie Pyo, MD  10/21/2022 5:22 PM    [facility] [address] [place], [place], [place]  [address] Phone: [phone]; Fax: [phone]   Note:  This document was prepared using Dragon voice recognition software and may include unintentional dictation errors.

## 2022-10-22 ENCOUNTER — Encounter: Payer: Self-pay | Admitting: Family Medicine

## 2022-10-25 ENCOUNTER — Encounter: Payer: Self-pay | Admitting: Family Medicine

## 2022-10-26 ENCOUNTER — Encounter: Payer: Self-pay | Admitting: Family Medicine

## 2022-10-27 ENCOUNTER — Ambulatory Visit: Payer: Medicare Other | Admitting: Internal Medicine

## 2022-10-27 DIAGNOSIS — E1122 Type 2 diabetes mellitus with diabetic chronic kidney disease: Secondary | ICD-10-CM

## 2022-10-27 DIAGNOSIS — I152 Hypertension secondary to endocrine disorders: Secondary | ICD-10-CM

## 2022-10-27 DIAGNOSIS — E1169 Type 2 diabetes mellitus with other specified complication: Secondary | ICD-10-CM

## 2022-11-17 DIAGNOSIS — E119 Type 2 diabetes mellitus without complications: Secondary | ICD-10-CM | POA: Diagnosis not present

## 2022-11-17 DIAGNOSIS — H04222 Epiphora due to insufficient drainage, left lacrimal gland: Secondary | ICD-10-CM | POA: Diagnosis not present

## 2022-11-17 DIAGNOSIS — H52203 Unspecified astigmatism, bilateral: Secondary | ICD-10-CM | POA: Diagnosis not present

## 2022-11-17 DIAGNOSIS — H2513 Age-related nuclear cataract, bilateral: Secondary | ICD-10-CM | POA: Diagnosis not present

## 2022-11-17 LAB — HM DIABETES EYE EXAM

## 2022-12-01 ENCOUNTER — Other Ambulatory Visit: Payer: Self-pay

## 2022-12-01 MED ORDER — ALPRAZOLAM 0.25 MG PO TABS: 0.2500 mg | ORAL_TABLET | Freq: Every day | ORAL | 0 refills | Status: DC | PRN

## 2022-12-01 NOTE — Telephone Encounter (Signed)
Bobby Sheffield, MD  to [facility]      12/01/22  3:49 PM  will recommend xanax to 1 tab daily as needed, he needs follow up appt

## 2022-12-01 NOTE — Telephone Encounter (Signed)
Patient is requesting a refill of the following medications: Requested Prescriptions   Pending Prescriptions Disp Refills   ALPRAZolam (XANAX) 0.25 MG tablet 30 tablet 2    Sig: Take 1 tablet (0.25 mg total) by mouth 2 (two) times daily as needed for anxiety.    Date of last refill:06/30/2022  Refill amount: 30 tablets 2 refills   Treatment agreement date: 04/07/2021

## 2022-12-01 NOTE — Telephone Encounter (Signed)
I called patient and he wants to know why medication was changed from twice daily to once daily? Patient also wants to know why he has no refills? Patient wants to know why he needs to come back in for another appointment? He states he just had appointment with Dr.Miller and had a full work up. Message routed back to PCP Venita Sheffield, MD

## 2022-12-02 NOTE — Telephone Encounter (Signed)
Patient called back and notified. He was highly upset and states that there was no need to cut down his medication to 1 tablet. He states he's been taking this medication for years. He expressed that this is going to cause him to suffer. Patient states and I quote "Why can't I get 180 tablets or 90 tablets like I use to have with refills, I DON'T ABUSE MY MEDICATION!!!" Message routed to PCP Venita Sheffield, MD

## 2022-12-02 NOTE — Telephone Encounter (Signed)
Bobby Sheffield, MD  to Me     12/02/22  9:16 AM Given his age high doses of xanax are not recommended, I do not give refills on controlled substances.

## 2022-12-28 ENCOUNTER — Ambulatory Visit (INDEPENDENT_AMBULATORY_CARE_PROVIDER_SITE_OTHER): Payer: Medicare Other | Admitting: Sports Medicine

## 2022-12-28 ENCOUNTER — Ambulatory Visit
Admission: RE | Admit: 2022-12-28 | Discharge: 2022-12-28 | Disposition: A | Discharge status: Home / Self Care | Payer: Medicare Other | Source: Ambulatory Visit | Attending: Sports Medicine | Admitting: Sports Medicine

## 2022-12-28 ENCOUNTER — Encounter: Payer: Self-pay | Admitting: Sports Medicine

## 2022-12-28 VITALS — BP 130/76 | HR 66 | Temp 96.1°F | Resp 16 | Ht 64.0 in | Wt 135.4 lb

## 2022-12-28 DIAGNOSIS — F411 Generalized anxiety disorder: Secondary | ICD-10-CM | POA: Diagnosis not present

## 2022-12-28 DIAGNOSIS — F5101 Primary insomnia: Secondary | ICD-10-CM

## 2022-12-28 DIAGNOSIS — R42 Dizziness and giddiness: Secondary | ICD-10-CM

## 2022-12-28 DIAGNOSIS — R55 Syncope and collapse: Secondary | ICD-10-CM | POA: Diagnosis not present

## 2022-12-28 MED ORDER — VENLAFAXINE HCL ER 75 MG PO CP24
75.0000 mg | ORAL_CAPSULE | Freq: Every day | ORAL | 2 refills | Status: AC
Start: 2022-12-28 — End: ?

## 2022-12-28 MED ORDER — TEMAZEPAM 7.5 MG PO CAPS: 7.5000 mg | ORAL_CAPSULE | Freq: Every evening | ORAL | 0 refills | Status: DC | PRN

## 2022-12-28 NOTE — Progress Notes (Signed)
Careteam: Patient Care Team: Venita Sheffield, MD as PCP - General (Internal Medicine) Maris Berger, MD as Consulting Physician (Ophthalmology)  PLACE OF SERVICE:  [facility]  Advanced Directive information Does Patient Have a Medical Advance Directive?: Yes, Type of Advance Directive: Out of facility DNR (pink MOST or yellow form), Does patient want to make changes to medical advance directive?: No - Patient declined  Allergies  Allergen Reactions   Imdur [Isosorbide Nitrate] Other (See Comments)    Pt reports causes dizziness and headache    No chief complaint on file.    HPI: Patient is a 89 y.o. male is here for acute visit for dizziness Pt states that he felt dizzy few days ago Usually he feels dizzy when he stands up but since Sunday he feels dizzy all day long Feels unsteady when he stands up  [facility] independently  No recent falls Drinks about 30 oz of water including coffee Denies headache, nausea, vomiting, blurring or double vision  Has h/o vertigo but denies room spinning like sensation  Denies trouble with swallowing, dysarthria   Pt is currently on temazepam and xanax  Pt states that he is the primary caretaker for his wife and  feels stressed and anxious He does not use a cane or walker with ambulation    Review of Systems:  Review of Systems  Constitutional:  Negative for chills and fever.  Eyes:  Negative for double vision.  Respiratory:  Negative for cough, sputum production and shortness of breath.   Cardiovascular:  Negative for chest pain, palpitations and leg swelling.  Gastrointestinal:  Negative for abdominal pain, heartburn, nausea and vomiting.  Genitourinary:  Negative for dysuria, frequency and hematuria.  Musculoskeletal:  Negative for falls and myalgias.  Neurological:  Positive for dizziness.  Psychiatric/Behavioral:  The patient is nervous/anxious and has insomnia.    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   Inguinal hernia without mention of obstruction or gangrene, unilateral or unspecified, (not specified as recurrent) 11/20/2008   Insomnia, unspecified 11/01/2003   Kidney calculi 08/20/2009   Other and unspecified hyperlipidemia 12/23/2004   Other malaise and fatigue 12/23/2004   Pain in joint, pelvic region and thigh 12/17/2009   Pain in joint, shoulder region 11/01/2003   Peptic ulcer, unspecified site, unspecified as acute or chronic, without mention of hemorrhage, perforation, or obstruction 06/25/2003   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 Kendrick Ranch   HERNIA REPAIR Bilateral 11/2008   inguinal   PILONIDAL CYST EXCISION  1963   PROSTATE SURGERY     TOTAL HIP ARTHROPLASTY Right 07/10/2009   Dr. Priscille Kluver  Social History:   reports that he quit smoking about 44 years ago. His smoking use included cigarettes. He has never used smokeless tobacco. He reports current alcohol use. He reports that he does not use drugs.  Family History  Problem Relation Age of Onset   Heart disease  Father     Medications: Patient's Medications  New Prescriptions   No medications on file  Previous Medications   ALPRAZOLAM (XANAX) 0.25 MG TABLET    Take 1 tablet (0.25 mg total) by mouth daily as needed for anxiety.   AMLODIPINE (NORVASC) 5 MG TABLET    Take one tablet by mouth once daily.   ASPIRIN 81 MG TABLET    Take 1 tablet (81 mg total) by mouth daily.   CHOLECALCIFEROL (VITAMIN D PO)    Take 1,000 Units by mouth daily.    LEVOTHYROXINE (SYNTHROID) 75 MCG TABLET    Take 1 tablet (75 mcg total) by mouth daily before breakfast.   LOSARTAN (COZAAR) 100 MG TABLET    Take one tablet by mouth once daily.   NITROGLYCERIN (NITROSTAT) 0.4 MG SL TABLET    Place 1 tablet (0.4 mg total) under the tongue every 5 (five) minutes as needed for chest pain.   ROSUVASTATIN (CRESTOR) 20 MG TABLET    Take 1 tablet (20 mg total) by mouth daily.   TEMAZEPAM (RESTORIL) 15 MG CAPSULE    TAKE 1 CAPSULE BY MOUTH AT BEDTIME AS NEEDED FOR SLEEP   TRIAMCINOLONE (KENALOG) 0.1 %    APPLY EXTERNALLY TO RASH TWICE DAILY AS NEEDED  Modified Medications   No medications on file  Discontinued Medications   ONETOUCH DELICA LANCETS 33G MISC    Check blood sugar once daily as directed E11.22   ONETOUCH ULTRA TEST STRIP    USE TO CHECK BLOOD SUGAR ONCE DAILY AND AS DIRECTED    Physical Exam:  Vitals:   12/28/22 1050  BP: 130/76  Pulse: 66  Resp: 16  Temp: (!) 96.1 F (35.6 C)  SpO2: 95%  Weight: 135 lb 6.4 oz (61.4 kg)  Height: 5\' 4"  (1.626 m)   Body mass index is 23.24 kg/m. Wt Readings from Last 3 Encounters:  12/28/22 135 lb 6.4 oz (61.4 kg)  10/06/22 137 lb (62.1 kg)  05/26/22 136 lb 3.2 oz (61.8 kg)    Physical Exam Constitutional:      Appearance: Normal appearance.  HENT:     Head: Normocephalic and atraumatic.  Cardiovascular:     Rate and Rhythm: Normal rate and regular rhythm.     Pulses: Normal pulses.     Heart sounds: Normal heart sounds.  Pulmonary:     Effort: No respiratory  distress.     Breath sounds: No stridor. No wheezing or rales.  Abdominal:     General: Bowel sounds are normal. There is no distension.     Palpations: Abdomen is soft.     Tenderness: There is no abdominal tenderness. There is no right CVA tenderness or guarding.  Musculoskeletal:        General: No swelling.  Neurological:     Mental Status: He is alert. Mental status is at baseline.     Comments: Gait - slow  Cannot follow my finger , unable to access EOM Finger nose negative Rombergs negative Strength and sensations intact      Labs reviewed: Basic Metabolic Panel: No results for input(s): "NA", "K", "CL", "CO2", "GLUCOSE", "BUN", "CREATININE", "CALCIUM", "MG", "PHOS", "TSH" in the last 8760  hours. Liver Function Tests: No results for input(s): "AST", "ALT", "ALKPHOS", "BILITOT", "PROT", "ALBUMIN" in the last 8760 hours. No results for input(s): "LIPASE", "AMYLASE" in the last 8760 hours. No results for input(s): "AMMONIA" in the last 8760 hours. CBC: No results for input(s): "WBC", "NEUTROABS", "HGB", "HCT", "MCV", "PLT" in the last 8760 hours. Lipid Panel: No results for input(s): "CHOL", "HDL", "LDLCALC", "TRIG", "CHOLHDL", "LDLDIRECT" in the last 8760 hours. TSH: No results for input(s): "TSH" in the last 8760 hours. A1C: Lab Results  Component Value Date   HGBA1C 5.8 (H) 10/07/2021     Assessment/Plan 1. Dizziness Ortho static vitals  Will order cbc, bmp  Will get CT head  Instructed patient to increase oral hydration  Explained to patient that xanax and temazepam are high risk medications given his age  Instructed to cut down xanax to 1/2 tab from 1 tab  Will cut down temazepam to 7.5 mg from 15 mg  - CBC With Differential/Platelet - Basic Metabolic Panel with eGFR - CT HEAD WO CONTRAST ( ); Future  2. GAD (generalized anxiety disorder) Will start effexor  Instructed to cut down xanax to 1/2 tab from 1 tab - venlafaxine XR (EFFEXOR XR) 75 MG 24 hr  capsule; Take 1 capsule (75 mg total) by mouth daily with breakfast.  Dispense: 90 capsule; Refill: 2  3. Primary insomnia Discussed regarding sleep hygiene Will cut down temazepam to 7.5 mg from 15 mg  Will instruct him to take melatonin at bed time  Avoid drinking coffee   Other orders - temazepam (RESTORIL) 7.5 MG capsule; Take 1 capsule (7.5 mg total) by mouth at bedtime as needed for sleep.  Dispense: 30 capsule; Refill: 0   No follow-ups on file.: 1 month

## 2022-12-29 ENCOUNTER — Other Ambulatory Visit: Payer: Self-pay

## 2022-12-29 DIAGNOSIS — I10 Essential (primary) hypertension: Secondary | ICD-10-CM

## 2022-12-29 DIAGNOSIS — R002 Palpitations: Secondary | ICD-10-CM

## 2022-12-29 DIAGNOSIS — E1169 Type 2 diabetes mellitus with other specified complication: Secondary | ICD-10-CM

## 2022-12-29 DIAGNOSIS — R079 Chest pain, unspecified: Secondary | ICD-10-CM

## 2022-12-29 LAB — CBC WITH DIFFERENTIAL/PLATELET
Absolute Lymphocytes: 1288 {cells}/uL (ref 850–3900)
Absolute Monocytes: 479 {cells}/uL (ref 200–950)
Basophils Absolute: 40 {cells}/uL (ref 0–200)
Basophils Relative: 0.7 %
Eosinophils Absolute: 268 {cells}/uL (ref 15–500)
Eosinophils Relative: 4.7 %
HCT: 40.4 % (ref 38.5–50.0)
Hemoglobin: 13.5 g/dL (ref 13.2–17.1)
MCH: 31 pg (ref 27.0–33.0)
MCHC: 33.4 g/dL (ref 32.0–36.0)
MCV: 92.9 fL (ref 80.0–100.0)
MPV: 11.1 fL (ref 7.5–12.5)
Monocytes Relative: 8.4 %
Neutro Abs: 3625 {cells}/uL (ref 1500–7800)
Neutrophils Relative %: 63.6 %
Platelets: 180 10*3/uL (ref 140–400)
RBC: 4.35 10*6/uL (ref 4.20–5.80)
RDW: 12.8 % (ref 11.0–15.0)
Total Lymphocyte: 22.6 %
WBC: 5.7 10*3/uL (ref 3.8–10.8)

## 2022-12-29 LAB — BASIC METABOLIC PANEL WITH GFR
BUN/Creatinine Ratio: 24 (calc) — ABNORMAL HIGH (ref 6–22)
BUN: 26 mg/dL — ABNORMAL HIGH (ref 7–25)
CO2: 27 mmol/L (ref 20–32)
Calcium: 9.4 mg/dL (ref 8.6–10.3)
Chloride: 105 mmol/L (ref 98–110)
Creat: 1.08 mg/dL (ref 0.70–1.22)
Glucose, Bld: 113 mg/dL — ABNORMAL HIGH (ref 65–99)
Potassium: 4.4 mmol/L (ref 3.5–5.3)
Sodium: 139 mmol/L (ref 135–146)
eGFR: 64 mL/min/{1.73_m2} (ref >=60)

## 2022-12-29 MED ORDER — ROSUVASTATIN CALCIUM 20 MG PO TABS: 20.0000 mg | ORAL_TABLET | Freq: Every day | ORAL | 3 refills | Status: AC

## 2023-01-02 NOTE — Progress Notes (Unsigned)
Cardiology Office Note:   Date:  01/04/2023  ID:  Bobby Jensen, DOB November 26, 1927, MRN [id number] PCP:  Venita Sheffield, MD  [facility] Providers Cardiologist:  Alverda Skeans, MD Referring MD: Frederica Kuster, MD  Chief Complaint/Reason for Referral: Cardiology follow-up ASSESSMENT:    1. Dyslipidemia   2. Primary hypertension     PLAN:   In order of problems listed above: Hyperlipidemia: Given advanced age would defer strict lipid lowering.  He has no indication for aspirin as he has not had a stroke, he is not a diabetic, and has no coronary artery disease.  Will discontinue aspirin today. Hypertension: Blood pressure is well-controlled today.             Dispo:  Return in about 6 months (around 07/05/2023).      Medication Adjustments/Labs and Tests Ordered: Current medicines are reviewed at length with the patient today.  Concerns regarding medicines are outlined above.  The following changes have been made:     Labs/tests ordered: Orders Placed This Encounter  Procedures   EKG 12-Lead    Medication Changes: No orders of the defined types were placed in this encounter.   Current medicines are reviewed at length with the patient today.  The patient does not have concerns regarding medicines.  I spent 30 minutes reviewing all clinical data during and prior to this visit including all relevant imaging studies, laboratories, clinical information from other health systems, and prior notes from both Cardiology and other specialties, interviewing the patient, and conducting a complete physical examination in order to formulate a comprehensive and personalized evaluation and treatment plan.  History of Present Illness:      FOCUSED PROBLEM LIST:   Hypertension Hyperlipidemia Hypothyroidism SVT Monitor 2023 BMI 23 Incomplete right bundle branch block Vertigo Evaluated and treated by ENT remotely (Dix-Hallpike maneuver)  October 2024:  The patient  is a 89 y.o. male with the indicated medical history here for routine cardiology follow-up.  The patient was last seen in 2023 and was doing well with occasional chest discomfort relieved by nitroglycerin.  At that time a lipid panel was drawn with an LDL of 60.  His biggest complaint today is dizziness.  This happens when he lays down with his head straight back.  He had originally developed symptoms like this several years ago when he turned his head to the left.  He saw ENT and had what sounds like a Dix-Hallpike maneuver which helped him a great deal.  He has not yet seen ENT for this issue.  He denies any exertional angina or exertional dyspnea.  When he gets angry he will get a chest tightness.  Sometimes he takes nitroglycerin.  This does not really help in the pain remits on its own in about 10 or 15 minutes.  He fortunately has not had any palpitations, paroxysmal atrial dyspnea, orthopnea.  He has had no severe bleeding or bruising.          Current Medications: Current Meds  Medication Sig   ALPRAZolam (XANAX) 0.25 MG tablet Take 1 tablet (0.25 mg total) by mouth daily as needed for anxiety.   amLODipine (NORVASC) 5 MG tablet Take one tablet by mouth once daily.   Cholecalciferol (VITAMIN D PO) Take 1,000 Units by mouth daily.    levothyroxine (SYNTHROID) 75 MCG tablet Take 1 tablet (75 mcg total) by mouth daily before breakfast.   losartan (COZAAR) 100 MG tablet Take one tablet by mouth once daily.   meclizine (  ANTIVERT) 12.5 MG tablet Take 1 tablet (12.5 mg total) by mouth daily as needed for dizziness.   nitroGLYCERIN (NITROSTAT) 0.4 MG SL tablet Place 1 tablet (0.4 mg total) under the tongue every 5 (five) minutes as needed for chest pain.   rosuvastatin (CRESTOR) 20 MG tablet Take 1 tablet (20 mg total) by mouth daily.   triamcinolone (KENALOG) 0.1 % APPLY EXTERNALLY TO RASH TWICE DAILY AS NEEDED   venlafaxine XR (EFFEXOR XR) 75 MG 24 hr capsule Take 1 capsule (75 mg total) by mouth  daily with breakfast.   [DISCONTINUED] aspirin 81 MG tablet Take 1 tablet (81 mg total) by mouth daily.   Current Facility-Administered Medications for the 01/04/23 encounter (Office Visit) with Orbie Pyo, MD  Medication   methylPREDNISolone acetate (DEPO-MEDROL) injection 20 mg     Review of Systems:   Please see the history of present illness.    All other systems reviewed and are negative.     EKGs/Labs/Other Test Reviewed:   EKG: EKG performed 2023 demonstrates normal sinus rhythm with PVCs sees  EKG Interpretation Date/Time:  Tuesday January 04 2023 14:36:32 EDT Ventricular Rate:  62 PR Interval:  166 QRS Duration:  94 QT Interval:  404 QTC Calculation: 410 R Axis:   12  Text Interpretation: Normal sinus rhythm Incomplete right bundle branch block When compared with ECG of 29-Aug-2018 10:14, No significant change since last tracing Confirmed by Alverda Skeans ([id number]) on 01/04/2023 2:41:47 PM         Risk Assessment/Calculations:          Physical Exam:   VS:  BP 126/62   Pulse 61   Ht 5\' 4"  (1.626 m)   Wt 131 lb 3.2 oz (59.5 kg)   SpO2 96%   BMI 22.52 kg/m        Wt Readings from Last 3 Encounters:  01/04/23 131 lb 3.2 oz (59.5 kg)  12/28/22 135 lb 6.4 oz (61.4 kg)  10/06/22 137 lb (62.1 kg)      GENERAL:  No apparent distress, AOx3 HEENT:  No carotid bruits, +2 carotid impulses, no scleral icterus CAR: RRR no murmurs, gallops, rubs, or thrills RES:  Clear to auscultation bilaterally ABD:  Soft, nontender, nondistended, positive bowel sounds x 4 VASC:  +2 radial pulses, +2 carotid pulses NEURO:  CN 2-12 grossly intact; motor and sensory grossly intact PSYCH:  No active depression or anxiety EXT:  No edema, ecchymosis, or cyanosis  Signed, Orbie Pyo, MD  01/04/2023 3:00 PM    [facility] [address] [place], [place], [place]  [address] Phone: [phone]; Fax: [phone]   Note:  This document was prepared using  Dragon voice recognition software and may include unintentional dictation errors.

## 2023-01-03 ENCOUNTER — Telehealth: Payer: Self-pay

## 2023-01-03 MED ORDER — MECLIZINE HCL 12.5 MG PO TABS: 12.5000 mg | ORAL_TABLET | Freq: Every day | ORAL | 0 refills | Status: AC | PRN

## 2023-01-03 NOTE — Telephone Encounter (Signed)
I called patient and he states that Effexor makes him dizzier. He states that he feels worse with this medication. He states that he never got Temazepam because insurance doesn't cover it and price is too high. Patient said he's taking Xanax to sleep and he needs more because he only has 5 left. Message routed to PCP Venita Sheffield, MD

## 2023-01-03 NOTE — Telephone Encounter (Signed)
Venita Sheffield, MD      Plz call the patient and inform him to take meclizine as needed for dizziness. Cont with effexor and decrease ativan and temazepam.

## 2023-01-03 NOTE — Telephone Encounter (Signed)
Patient called and states that medication you prescribed him has only made him dizzier. Patient wants to know what else can he do. Message routed to PCP Venita Sheffield, MD .

## 2023-01-04 ENCOUNTER — Ambulatory Visit: Payer: Medicare Other | Attending: Internal Medicine | Admitting: Internal Medicine

## 2023-01-04 ENCOUNTER — Encounter: Payer: Self-pay | Admitting: Internal Medicine

## 2023-01-04 VITALS — BP 126/62 | HR 61 | Ht 64.0 in | Wt 131.2 lb

## 2023-01-04 DIAGNOSIS — E785 Hyperlipidemia, unspecified: Secondary | ICD-10-CM

## 2023-01-04 DIAGNOSIS — I10 Essential (primary) hypertension: Secondary | ICD-10-CM

## 2023-01-04 DIAGNOSIS — E1169 Type 2 diabetes mellitus with other specified complication: Secondary | ICD-10-CM

## 2023-01-04 DIAGNOSIS — N1831 Chronic kidney disease, stage 3a: Secondary | ICD-10-CM

## 2023-01-04 DIAGNOSIS — I152 Hypertension secondary to endocrine disorders: Secondary | ICD-10-CM

## 2023-01-04 MED ORDER — ALPRAZOLAM 0.25 MG PO TABS: 0.2500 mg | ORAL_TABLET | Freq: Every day | ORAL | 0 refills | Status: DC | PRN

## 2023-01-04 NOTE — Telephone Encounter (Signed)
Patient returned call and stated he is no longer going to take the temazepam and ok'd removing off medication list. Patient states he is still with dizziness, however declined a visit at this time, as he plans to follow-up with cardiologist .  Patient will call us if needed

## 2023-01-04 NOTE — Telephone Encounter (Signed)
Below is Dr.Veludandi's response   Bobby Sheffield, MD  [facility] Pool36 minutes ago (8:28 AM)    As per PDMP review, pt filled temazepam 15 mg, 90 pills on 7/17 Refilled xanax    Left message on voicemail for patient to return call when available

## 2023-01-04 NOTE — Telephone Encounter (Signed)
As per PDMP review  pt filled temazepam on 09/29/2022 , 90 pills Will refill xanax   12/01/2022 12/01/2022  1 Alprazolam 0.25 Mg Tablet 30.00 30 Pr Vel [id number] [facility] ([facility]) 0/0 0.50 LME Medicare [place]  09/29/2022 06/30/2022  1 Temazepam 15 Mg Capsule 90.00 90 [address] Mil [id number] [facility] (8206) 1/3 0.75 LME Medicare [place]  09/08/2022 06/30/2022  1 Alprazolam 0.25 Mg Tablet 30.00 15 St Mil [id number] [facility] (8206) 2/2 1.00 LME Medicare [place]

## 2023-01-04 NOTE — Patient Instructions (Addendum)
Medication Instructions:  Your physician has recommended you make the following change in your medication:   1) STOP aspirin  *If you need a refill on your cardiac medications before your next appointment, please call your pharmacy*  Lab Work: None ordered today.  Testing/Procedures: None ordered today.  Follow-Up: At [facility], you and your health needs are our priority.  As part of our continuing mission to provide you with exceptional heart care, we have created designated Provider Care Teams.  These Care Teams include your primary Cardiologist (physician) and Advanced Practice Providers (APPs -  Physician Assistants and Nurse Practitioners) who all work together to provide you with the care you need, when you need it.  We recommend signing up for the patient portal called "MyChart".  Sign up information is provided on this After Visit Summary.  MyChart is used to connect with patients for Virtual Visits (Telemedicine).  Patients are able to view lab/test results, encounter notes, upcoming appointments, etc.  Non-urgent messages can be sent to your provider as well.   To learn more about what you can do with MyChart, go to [email or web address].    Your next appointment:   6 month(s)  The format for your next appointment:   In Person  Provider:   Jari Favre, PA-C, Ronie Spies, PA-C, Robin Searing, NP, Jacolyn Reedy, PA-C, Eligha Bridegroom, NP, Tereso Newcomer, PA-C, or Perlie Gold, PA-C

## 2023-01-07 ENCOUNTER — Telehealth: Payer: Self-pay

## 2023-01-07 DIAGNOSIS — R42 Dizziness and giddiness: Secondary | ICD-10-CM

## 2023-01-07 NOTE — Addendum Note (Signed)
Addended by: Venita Sheffield on: 01/07/2023 12:52 PM   Modules accepted: Orders

## 2023-01-07 NOTE — Telephone Encounter (Signed)
Called and discussed Venita Sheffield, MD response. Patient verbalized understanding

## 2023-01-07 NOTE — Telephone Encounter (Signed)
Patient states he has vertigo and he thinks the Meclizine is making the dizziness worse. Patient wants to know is there anything he could do differently including exercises

## 2023-01-10 ENCOUNTER — Encounter: Payer: Self-pay | Admitting: Physical Therapy

## 2023-01-10 ENCOUNTER — Other Ambulatory Visit: Payer: Self-pay

## 2023-01-10 ENCOUNTER — Ambulatory Visit (INDEPENDENT_AMBULATORY_CARE_PROVIDER_SITE_OTHER): Payer: Medicare Other | Admitting: Physical Therapy

## 2023-01-10 DIAGNOSIS — H8111 Benign paroxysmal vertigo, right ear: Secondary | ICD-10-CM

## 2023-01-10 DIAGNOSIS — R42 Dizziness and giddiness: Secondary | ICD-10-CM

## 2023-01-10 NOTE — Therapy (Signed)
OUTPATIENT PHYSICAL THERAPY VESTIBULAR EVALUATION     Patient Name: Bobby Jensen MRN: [id number] DOB:03-Jan-1928, 89 y.o., male Today's Date: 01/10/2023  END OF SESSION:  PT End of Session - 01/10/23 1544     Visit Number 1    Number of Visits 6    Date for PT Re-Evaluation 02/07/23    Authorization Type [employer] Medicare    PT Start Time 1520    PT Stop Time 1543    PT Time Calculation (min) 23 min    Activity Tolerance Patient tolerated treatment well    Behavior During Therapy WFL for tasks assessed/performed             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   Inguinal hernia without mention of obstruction or gangrene, unilateral or unspecified, (not specified as recurrent) 11/20/2008   Insomnia, unspecified 11/01/2003   Kidney calculi 08/20/2009   Other and unspecified hyperlipidemia 12/23/2004   Other malaise and fatigue 12/23/2004   Pain in joint, pelvic region and thigh 12/17/2009   Pain in joint, shoulder region 11/01/2003   Peptic ulcer, unspecified site, unspecified as acute or chronic, without mention of hemorrhage, perforation, or obstruction 06/25/2003   Personal history of malaria 03/16/1939   Right rotator cuff tear 07/10/2018   Chronic per imaging 4/20   Right shoulder injury 07/10/2018   Rosacea 08/20/2009   SDH (subdural hematoma) (HCC)    Type II or unspecified type diabetes mellitus without mention of complication, not stated as uncontrolled 07/15/2006   Unspecified constipation  08/05/2010   Unspecified hypothyroidism 12/17/2009   Past Surgical History:  Procedure Laterality Date   APPENDECTOMY  1956   HEMORROIDECTOMY  06/2002   Dr. Kendrick [place]   HERNIA REPAIR Bilateral 11/2008   inguinal   PILONIDAL CYST EXCISION  1963   PROSTATE SURGERY     TOTAL HIP ARTHROPLASTY Right 07/10/2009   Dr. Priscille Kluver   Patient Active Problem List   Diagnosis Date Noted   Grief at loss of child 09/20/2019   Hyperlipidemia associated with type 2 diabetes mellitus (HCC) 03/19/2019   Right rotator cuff tear 07/10/2018   Right shoulder injury 07/10/2018   Fall    Closed fracture of one rib of right side    Primary osteoarthritis of right hip 03/17/2017   Bell's palsy 05/06/2015   Vertigo 11/01/2014   Bursitis of right hip 05/23/2014   Bradycardia 07/11/2013   Anxiety state 07/11/2013   Hypothyroidism 12/17/2009   Hyperlipidemia 12/23/2004   Insomnia 11/01/2003   Essential hypertension 07/08/1998   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  He reports "constant dizziness" but worse when trying to lie down.  When he lies down symptoms only last a few seconds. Pt accompanied by: self  PERTINENT HISTORY: HTN, DM, hx SDH, Rt THA (2011)  PAIN:  Are you having pain? No  PRECAUTIONS: Fall  RED FLAGS: None   WEIGHT BEARING RESTRICTIONS: No  FALLS: Has patient fallen in last 6 months? No  LIVING  ENVIRONMENT: Lives with: lives with their spouse Lives in: House/apartment  PLOF: Independent and Leisure: crossword puzzles  PATIENT GOALS: improve dizziness  OBJECTIVE:  Note: Objective measures were completed at Evaluation unless otherwise noted.  DIAGNOSTIC FINDINGS: CT Head: No acute intracranial abnormality. No CT etiology for dizziness identified    COGNITION: Overall cognitive status: Within functional limits for tasks assessed  POSTURE:  rounded shoulders and forward head  GAIT: Comments: amb independently without AD; slight decrease in step length and wide base of support  VESTIBULAR ASSESSMENT:  GENERAL OBSERVATION: "slightly dizzy"   SYMPTOM BEHAVIOR:  Subjective history: see above  Non-Vestibular symptoms:  none  Type of dizziness: Imbalance (Disequilibrium) and Spinning/Vertigo  Frequency: when lying down, and constant imbalance  Duration: seconds, imbalance is constant  Aggravating factors: Induced by position change: lying supine  Relieving factors:  spontaneous resolve  Progression of symptoms: better  OCULOMOTOR EXAM:  Ocular Alignment: normal  Ocular ROM: No Limitations  Spontaneous Nystagmus: absent  Gaze-Induced Nystagmus: absent  Smooth Pursuits: intact  Saccades: intact and slow    VESTIBULAR - OCULAR REFLEX:   Slow VOR: Normal and Comment: guarding  Head-Impulse Test: Deferred due to guarding     POSITIONAL TESTING: Right Sidelying: upbeating, right nystagmus and Duration: 5 sec Left Sidelying: no nystagmus  MOTION SENSITIVITY:  Motion Sensitivity Quotient Intensity: 0 = none, 1 = Lightheaded, 2 = Mild, 3 = Moderate, 4 = Severe, 5 = Vomiting  Intensity  1. Sitting to supine   2. Supine to L side   3. Supine to R side   4. Supine to sitting   5. L Hallpike-Dix   6. Up from L    7. R Hallpike-Dix   8. Up from R    9. Sitting, head tipped to L knee   10. Head up from L knee   11. Sitting, head tipped to R knee   12. Head up  from R knee   13. Sitting head turns x5   14.Sitting head nods x5   15. In stance, 180 turn to L    16. In stance, 180 turn to R     OTHOSTATICS: not done   VESTIBULAR TREATMENT:                                                                                                   DATE:  01/10/23 Canalith Repositioning:  Epley Right: Number of Reps: 1 and Response to Treatment: symptoms improved   PATIENT EDUCATION: Education details: BPPV Person educated: Patient Education method: Medical illustrator Education comprehension: verbalized understanding  HOME EXERCISE PROGRAM:  GOALS: Goals reviewed with patient?  Yes   LONG TERM GOALS: Target date: 02/07/2023  Demonstrate negative positional testing Goal status: INITIAL  2.  Independent with HEP if appropriate Goal status: INITIAL  ASSESSMENT:  CLINICAL IMPRESSION: Patient is a 89 y.o. male who was seen today for physical therapy evaluation and treatment for dizziness. He demonstrates symptoms and clinical findings consistent with Rt pBPPV.  He will benefit from PT to address deficits listed.     OBJECTIVE IMPAIRMENTS: decreased balance and dizziness.   ACTIVITY LIMITATIONS: standing, sleeping, transfers, bed mobility, and locomotion level  PARTICIPATION LIMITATIONS: meal prep, cleaning, driving, and community activity  PERSONAL FACTORS: 3+ comorbidities: HTN, DM, hx SDH, Rt THA (2011)  are also affecting patient's functional outcome.   REHAB POTENTIAL: Good  CLINICAL DECISION MAKING: Stable/uncomplicated  EVALUATION COMPLEXITY: Low   PLAN:  PT FREQUENCY: 1x/week  PT DURATION: 4 weeks  PLANNED INTERVENTIONS: 97164- PT Re-evaluation, 97110-Therapeutic exercises, 97530- Therapeutic activity, 97112- Neuromuscular re-education, 97535- Self Care, 16109- Manual therapy, [phone]- Aquatic Therapy, Patient/Family education, Balance training, Vestibular training, Cryotherapy, Moist heat, and canalith  repositioning  PLAN FOR NEXT SESSION: reassess canals, may want to check horizontal   Clarita Crane, PT, DPT 01/10/23 3:54 PM    Date of referral: 01/07/23 Referring provider: Venita Sheffield, * Referring diagnosis? R42 Treatment diagnosis? (if different than referring diagnosis) R42, H81.11  What was this (referring dx) caused by? Other: spontaneous  Nature of Condition: Initial Onset (within last 3 months)   Laterality: Rt  Current Functional Measure Score: Other n/a - vertigo  Objective measurements identify impairments when they are compared to normal values, the uninvolved extremity, and prior level of function.  [x]  Yes  []  No  Objective assessment of functional ability: Minimal functional limitations   Briefly describe symptoms: vertigo, dizziness  How did symptoms start: spontaneous  Average pain intensity:  Last 24 hours: nA  Past week: n/a  How often does the pt experience symptoms? Frequently  How much have the symptoms interfered with usual daily activities? Moderately  How has condition changed since care began at this facility? No change  In general, how is the patients overall health? Good   BACK PAIN (STarT Back Screening Tool) No

## 2023-01-19 ENCOUNTER — Encounter: Payer: Medicare Other | Admitting: Physical Therapy

## 2023-01-25 ENCOUNTER — Ambulatory Visit: Payer: Medicare Other | Admitting: Sports Medicine

## 2023-02-04 NOTE — Progress Notes (Signed)
This encounter was created in error - please disregard.

## 2023-02-07 ENCOUNTER — Other Ambulatory Visit: Payer: Self-pay | Admitting: Sports Medicine

## 2023-02-07 ENCOUNTER — Telehealth: Payer: Self-pay

## 2023-02-07 NOTE — Telephone Encounter (Signed)
Patient walked in stating that he would like for Dr.Veludandi to change his Xanax to 0.5 mg tablet as he has decided on his own to take 2 of the 0.25 mg tablets at night and has "Slept like a baby." As a results of changing the dosing on his own he is running out of medication sooner than expected.  Patient was educated on the importance of not changing his medications without consulting with the provider first and verbalized understanding.   Please advise

## 2023-02-07 NOTE — Telephone Encounter (Signed)
Patient has request refill on medication Xanax. Medication last refilled 01/04/2023.Patient has no Contract.  Patient medication pend and sent to PCP Venita Sheffield, MD for approval.

## 2023-02-22 NOTE — Telephone Encounter (Signed)
Providers response:  Venita Sheffield, MD  You35 minutes ago (3:47 PM)    Would recommend decreasing the xanax to 1 tab daily  due to his dizziness and xanax being a high risk medication.  Spoke with patient, patient states he needs this medication to go to sleep, 2 pills. Patient states he will have to find another doctor if Dr.Veludandi will not authorize 2 pills or increase his dosage, patient then hung up on me.

## 2023-02-22 NOTE — Telephone Encounter (Signed)
Message was never responded to

## 2023-03-07 DIAGNOSIS — I10 Essential (primary) hypertension: Secondary | ICD-10-CM | POA: Diagnosis not present

## 2023-03-07 DIAGNOSIS — G4700 Insomnia, unspecified: Secondary | ICD-10-CM | POA: Diagnosis not present

## 2023-03-07 DIAGNOSIS — E039 Hypothyroidism, unspecified: Secondary | ICD-10-CM | POA: Diagnosis not present

## 2023-03-07 DIAGNOSIS — E7800 Pure hypercholesterolemia, unspecified: Secondary | ICD-10-CM | POA: Diagnosis not present

## 2023-03-23 ENCOUNTER — Ambulatory Visit: Payer: Medicare Other | Admitting: Sports Medicine

## 2023-08-29 DIAGNOSIS — E039 Hypothyroidism, unspecified: Secondary | ICD-10-CM | POA: Diagnosis not present

## 2023-08-29 DIAGNOSIS — I10 Essential (primary) hypertension: Secondary | ICD-10-CM | POA: Diagnosis not present

## 2023-08-29 DIAGNOSIS — E7800 Pure hypercholesterolemia, unspecified: Secondary | ICD-10-CM | POA: Diagnosis not present

## 2023-09-05 DIAGNOSIS — R42 Dizziness and giddiness: Secondary | ICD-10-CM | POA: Diagnosis not present

## 2023-09-05 DIAGNOSIS — H6120 Impacted cerumen, unspecified ear: Secondary | ICD-10-CM | POA: Diagnosis not present

## 2023-09-05 DIAGNOSIS — E7800 Pure hypercholesterolemia, unspecified: Secondary | ICD-10-CM | POA: Diagnosis not present

## 2023-09-05 DIAGNOSIS — Z0000 Encounter for general adult medical examination without abnormal findings: Secondary | ICD-10-CM | POA: Diagnosis not present

## 2023-09-05 DIAGNOSIS — R54 Age-related physical debility: Secondary | ICD-10-CM | POA: Diagnosis not present

## 2023-09-11 DIAGNOSIS — R42 Dizziness and giddiness: Secondary | ICD-10-CM | POA: Diagnosis not present

## 2023-10-12 DIAGNOSIS — R2689 Other abnormalities of gait and mobility: Secondary | ICD-10-CM | POA: Diagnosis not present

## 2023-10-12 DIAGNOSIS — H8192 Unspecified disorder of vestibular function, left ear: Secondary | ICD-10-CM | POA: Diagnosis not present

## 2023-10-12 DIAGNOSIS — M2560 Stiffness of unspecified joint, not elsewhere classified: Secondary | ICD-10-CM | POA: Diagnosis not present

## 2023-10-17 DIAGNOSIS — H9313 Tinnitus, bilateral: Secondary | ICD-10-CM | POA: Diagnosis not present

## 2023-10-17 DIAGNOSIS — R42 Dizziness and giddiness: Secondary | ICD-10-CM | POA: Diagnosis not present

## 2023-10-17 DIAGNOSIS — I951 Orthostatic hypotension: Secondary | ICD-10-CM | POA: Diagnosis not present
